# Patient Record
Sex: Female | Born: 1965 | Race: White | Hispanic: No | Smoking: Current every day smoker
Health system: Southern US, Community
[De-identification: ages and names within clinical notes are randomized; demographics above are authoritative.]

## PROBLEM LIST (undated history)

## (undated) DIAGNOSIS — R7611 Nonspecific reaction to tuberculin skin test without active tuberculosis: Secondary | ICD-10-CM

## (undated) DIAGNOSIS — M545 Low back pain, unspecified: Secondary | ICD-10-CM

## (undated) DIAGNOSIS — I10 Essential (primary) hypertension: Secondary | ICD-10-CM

## (undated) DIAGNOSIS — R51 Headache: Secondary | ICD-10-CM

## (undated) DIAGNOSIS — J449 Chronic obstructive pulmonary disease, unspecified: Secondary | ICD-10-CM

## (undated) HISTORY — DX: Low back pain, unspecified: M54.50

## (undated) HISTORY — DX: Low back pain: M54.5

## (undated) HISTORY — DX: Chronic obstructive pulmonary disease, unspecified: J44.9

---

## 1996-07-03 DIAGNOSIS — R7611 Nonspecific reaction to tuberculin skin test without active tuberculosis: Secondary | ICD-10-CM

## 1996-07-03 HISTORY — DX: Nonspecific reaction to tuberculin skin test without active tuberculosis: R76.11

## 1997-07-03 HISTORY — PX: PARTIAL HYSTERECTOMY: SHX80

## 2002-09-13 LAB — HM COLONOSCOPY: HM Colonoscopy: NORMAL

## 2004-07-05 ENCOUNTER — Emergency Department: Payer: Self-pay | Admitting: Emergency Medicine

## 2005-01-22 ENCOUNTER — Other Ambulatory Visit: Payer: Self-pay

## 2005-01-22 ENCOUNTER — Emergency Department: Payer: Self-pay | Admitting: Emergency Medicine

## 2005-02-10 ENCOUNTER — Emergency Department: Payer: Self-pay | Admitting: Emergency Medicine

## 2005-04-27 ENCOUNTER — Emergency Department: Payer: Self-pay | Admitting: Internal Medicine

## 2005-06-15 ENCOUNTER — Emergency Department: Payer: Self-pay | Admitting: Emergency Medicine

## 2006-09-13 LAB — HM PAP SMEAR: HM Pap smear: NORMAL

## 2007-04-24 ENCOUNTER — Emergency Department: Payer: Self-pay | Admitting: Emergency Medicine

## 2007-11-19 ENCOUNTER — Emergency Department: Payer: Self-pay | Admitting: Emergency Medicine

## 2008-03-13 ENCOUNTER — Emergency Department: Payer: Self-pay | Admitting: Internal Medicine

## 2008-05-05 ENCOUNTER — Emergency Department: Payer: Self-pay | Admitting: Unknown Physician Specialty

## 2008-06-25 ENCOUNTER — Emergency Department: Payer: Self-pay | Admitting: Internal Medicine

## 2008-08-12 ENCOUNTER — Emergency Department: Payer: Self-pay | Admitting: Internal Medicine

## 2008-09-28 ENCOUNTER — Emergency Department: Payer: Self-pay | Admitting: Emergency Medicine

## 2008-10-04 ENCOUNTER — Emergency Department: Payer: Self-pay | Admitting: Emergency Medicine

## 2009-03-21 ENCOUNTER — Emergency Department: Payer: Self-pay | Admitting: Unknown Physician Specialty

## 2009-05-28 ENCOUNTER — Emergency Department: Payer: Self-pay | Admitting: Emergency Medicine

## 2009-06-15 ENCOUNTER — Emergency Department: Payer: Self-pay | Admitting: Emergency Medicine

## 2009-08-02 ENCOUNTER — Emergency Department: Payer: Self-pay | Admitting: Emergency Medicine

## 2009-08-23 ENCOUNTER — Emergency Department: Payer: Self-pay | Admitting: Emergency Medicine

## 2009-10-18 ENCOUNTER — Emergency Department: Payer: Self-pay | Admitting: Emergency Medicine

## 2009-11-13 ENCOUNTER — Emergency Department: Payer: Self-pay | Admitting: Emergency Medicine

## 2010-03-13 ENCOUNTER — Emergency Department: Payer: Self-pay | Admitting: Emergency Medicine

## 2010-04-28 ENCOUNTER — Emergency Department: Payer: Self-pay | Admitting: Emergency Medicine

## 2010-05-04 ENCOUNTER — Emergency Department: Payer: Self-pay | Admitting: Emergency Medicine

## 2010-05-09 ENCOUNTER — Emergency Department: Payer: Self-pay | Admitting: Emergency Medicine

## 2010-08-01 ENCOUNTER — Emergency Department: Payer: Self-pay | Admitting: Emergency Medicine

## 2010-09-16 ENCOUNTER — Emergency Department: Payer: Self-pay | Admitting: Emergency Medicine

## 2011-05-11 ENCOUNTER — Emergency Department: Payer: Self-pay | Admitting: Emergency Medicine

## 2011-08-10 ENCOUNTER — Telehealth: Payer: Self-pay | Admitting: Internal Medicine

## 2011-08-10 NOTE — Telephone Encounter (Signed)
Pt called  To make new patient appointment  This was made for 09/25/11 She went urgent care [facility] and they wanted her to follow up for bp  They gave her enough meds to last till 3/5  Pt wanted to know if she could be see sooner

## 2011-08-15 NOTE — Telephone Encounter (Signed)
Yes, please move into earlier slot, 

## 2011-08-16 NOTE — Telephone Encounter (Signed)
Pt had appointment 2/26  Pt aware

## 2011-08-16 NOTE — Telephone Encounter (Signed)
Left message for pt to call office to r/s

## 2011-08-29 ENCOUNTER — Encounter: Payer: Self-pay | Admitting: Internal Medicine

## 2011-08-29 ENCOUNTER — Ambulatory Visit (INDEPENDENT_AMBULATORY_CARE_PROVIDER_SITE_OTHER): Payer: BC Managed Care – PPO | Admitting: Internal Medicine

## 2011-08-29 ENCOUNTER — Ambulatory Visit (INDEPENDENT_AMBULATORY_CARE_PROVIDER_SITE_OTHER)
Admission: RE | Admit: 2011-08-29 | Discharge: 2011-08-29 | Disposition: A | Discharge status: Home / Self Care | Payer: BC Managed Care – PPO | Source: Ambulatory Visit | Attending: Internal Medicine | Admitting: Internal Medicine

## 2011-08-29 VITALS — BP 148/78 | HR 72 | Temp 98.3°F | Ht 64.0 in | Wt 163.0 lb

## 2011-08-29 DIAGNOSIS — M25569 Pain in unspecified knee: Secondary | ICD-10-CM

## 2011-08-29 DIAGNOSIS — I10 Essential (primary) hypertension: Secondary | ICD-10-CM

## 2011-08-29 DIAGNOSIS — M25561 Pain in right knee: Secondary | ICD-10-CM

## 2011-08-29 DIAGNOSIS — J449 Chronic obstructive pulmonary disease, unspecified: Secondary | ICD-10-CM

## 2011-08-29 DIAGNOSIS — M79609 Pain in unspecified limb: Secondary | ICD-10-CM

## 2011-08-29 DIAGNOSIS — M79672 Pain in left foot: Secondary | ICD-10-CM

## 2011-08-29 DIAGNOSIS — Z1239 Encounter for other screening for malignant neoplasm of breast: Secondary | ICD-10-CM | POA: Insufficient documentation

## 2011-08-29 MED ORDER — MELOXICAM 15 MG PO TABS: 15.0000 mg | ORAL_TABLET | Freq: Every day | ORAL | Status: DC

## 2011-08-29 MED ORDER — ALBUTEROL SULFATE HFA 108 (90 BASE) MCG/ACT IN AERS: 2.0000 | INHALATION_SPRAY | Freq: Four times a day (QID) | RESPIRATORY_TRACT | Status: DC | PRN

## 2011-08-29 NOTE — Assessment & Plan Note (Signed)
Patient was started on lisinopril at urgent care. She had some abdominal pain at that time and stopped the medication. We'll have her resume lisinopril today. She will call if any noted side effects. We will obtain records on her recent renal function as checked at urgent care. Followup here in one month.

## 2011-08-29 NOTE — Assessment & Plan Note (Signed)
Exam is remarkable for some posterior knee tenderness. Given her job, question if she may have a meniscal tear. Will get plain x-ray for initial evaluation and then proceed with MRI if x-ray is normal. Will start meloxicam as needed for pain. Follow up 1 month.

## 2011-08-29 NOTE — Assessment & Plan Note (Signed)
Patient reports history of plantar fasciitis. She now has a nodular area in her left medial foot which is concerning for neuroma. Will set her up with podiatry for evaluation and possible steroid injection. She ultimately may need this area resected given that she spends at least 12 hours per day on her feet.

## 2011-08-29 NOTE — Assessment & Plan Note (Signed)
Mammogram ordered today. She will be scheduled for physical including breast exam in one month.

## 2011-08-29 NOTE — Progress Notes (Signed)
Subjective:    Patient ID: Allison Brennan, female    DOB: 01/07/1966, 46 y.o.   MRN: [id number]  HPI 46 year old female with history of hypertension presents to establish care. She reports that she was diagnosed with hypertension at urgent care and was started on lisinopril. She took the medication for a couple of weeks and then stopped approximately 2 weeks ago because she was having abdominal pain. At the same time, she was started on an anti-inflammatory medicine and questions whether this may have been the cause of her abdominal pain. She is not currently taking anything for her blood pressure. She denies any headache, palpitations, chest pain.  She also has a history of COPD and is a current smoker. She reports she has tried to quit smoking in the past including using Wellbutrin and Chantix without success. She would like to try to quit again. She denies any current shortness of breath, cough, or other symptoms. In the past, she has used albuterol as needed for episodes of cough or dyspnea. She is currently out of this medication.  She is also concerned today about a several month history of right knee pain. She denies any specific injury to her right knee but notes that she spends more than 12 hours per day standing. She reports occasional popping sound in her right knee. She also notes some instability of her knee. She notes pain in her posterior right knee. She denies any swelling. She denies any weakness in her leg. She has taken lodine for this with no improvement.  She is also concerned today about several month history of left foot pain. She notes that she was diagnosed with plantar fasciitis in the past and has now developed a nodular area in the middle of her left foot. This area is painful and has inhibited her from wearing her steel toed boots at work. She has tried orthotics with no improvement.  Outpatient Encounter Prescriptions as of 08/29/2011  Medication Sig Dispense Refill  .  albuterol (PROVENTIL HFA;VENTOLIN HFA) 108 (90 BASE) MCG/ACT inhaler Inhale 2 puffs into the lungs every 6 (six) hours as needed for wheezing.  1 Inhaler  0  . lisinopril (PRINIVIL,ZESTRIL) 10 MG tablet Take 10 mg by mouth daily.      . meloxicam (MOBIC) 15 MG tablet Take 1 tablet (15 mg total) by mouth daily.  30 tablet  0    Review of Systems  Constitutional: Negative for fever, chills, appetite change, fatigue and unexpected weight change.  HENT: Negative for ear pain, congestion, sore throat, trouble swallowing, neck pain, voice change and sinus pressure.   Eyes: Negative for visual disturbance.  Respiratory: Negative for cough, shortness of breath, wheezing and stridor.   Cardiovascular: Negative for chest pain, palpitations and leg swelling.  Gastrointestinal: Negative for nausea, vomiting, abdominal pain, diarrhea, constipation, blood in stool, abdominal distention and anal bleeding.  Genitourinary: Negative for dysuria and flank pain.  Musculoskeletal: Positive for myalgias and arthralgias. Negative for gait problem.  Skin: Negative for color change and rash.  Neurological: Negative for dizziness and headaches.  Hematological: Negative for adenopathy. Does not bruise/bleed easily.  Psychiatric/Behavioral: Negative for suicidal ideas, sleep disturbance and dysphoric mood. The patient is not nervous/anxious.    BP 148/78  Pulse 72  Temp(Src) 98.3 F (36.8 C) (Oral)  Ht 5\' 4"  (1.626 m)  Wt 163 lb (73.936 kg)  BMI 27.98 kg/m2  SpO2 99%     Objective:   Physical Exam  Constitutional: She is oriented  to person, place, and time. She appears well-developed and well-nourished. No distress.  HENT:  Head: Normocephalic and atraumatic.  Right Ear: External ear normal.  Left Ear: External ear normal.  Nose: Nose normal.  Mouth/Throat: Oropharynx is clear and moist. No oropharyngeal exudate.  Eyes: Conjunctivae are normal. Pupils are equal, round, and reactive to light. Right eye  exhibits no discharge. Left eye exhibits no discharge. No scleral icterus.  Neck: Normal range of motion. Neck supple. No tracheal deviation present. No thyromegaly present.  Cardiovascular: Normal rate, regular rhythm, normal heart sounds and intact distal pulses.  Exam reveals no gallop and no friction rub.   No murmur heard. Pulmonary/Chest: Effort normal. No respiratory distress. She has decreased breath sounds (prolonged expiration). She has no wheezes. She has no rales. She exhibits no tenderness.  Musculoskeletal: She exhibits no edema and no tenderness.       Right knee: She exhibits normal range of motion, no swelling, no effusion, no deformity, no LCL laxity, normal patellar mobility and no bony tenderness. tenderness found.       Left foot: She exhibits tenderness. She exhibits normal range of motion.       Feet:  Lymphadenopathy:    She has no cervical adenopathy.  Neurological: She is alert and oriented to person, place, and time. No cranial nerve deficit. She exhibits normal muscle tone. Coordination normal.  Skin: Skin is warm and dry. No rash noted. She is not diaphoretic. No erythema. No pallor.  Psychiatric: She has a normal mood and affect. Her behavior is normal. Judgment and thought content normal.          Assessment & Plan:

## 2011-08-29 NOTE — Assessment & Plan Note (Signed)
Patient reports diagnosis of COPD in the past. She is a current smoker. Strongly encourage smoking cessation. Will resume albuterol as needed for episodes of cough or dyspnea. Will obtain records on previous evaluation and management.

## 2011-08-30 ENCOUNTER — Other Ambulatory Visit: Payer: Self-pay | Admitting: *Deleted

## 2011-08-30 MED ORDER — ALBUTEROL SULFATE HFA 108 (90 BASE) MCG/ACT IN AERS: 2.0000 | INHALATION_SPRAY | Freq: Four times a day (QID) | RESPIRATORY_TRACT | Status: DC | PRN

## 2011-09-25 ENCOUNTER — Ambulatory Visit: Payer: Self-pay | Admitting: Internal Medicine

## 2011-09-25 ENCOUNTER — Telehealth: Payer: Self-pay | Admitting: Internal Medicine

## 2011-09-25 MED ORDER — LISINOPRIL 10 MG PO TABS: 10.0000 mg | ORAL_TABLET | Freq: Every day | ORAL | Status: DC

## 2011-09-25 NOTE — Telephone Encounter (Signed)
Caller: Miko/Patient; PCP: Ronna Polio; CB#: [phone]; ; ; Call regarding Dizziness upon standing up out of bed this am;  Pt calling regarding dizziness; onset this am upon standing-lasting 45 min. Pt takes Lisinipril with HCTZ but ran out on 3/22. Asymptomatic now. Afebrile. Emergent sxs of Dizziness r/o. Disp: See in 24 hrs. RX has been called in to [facility] today-relayed to pt. Appt made for 10/03/2011 for BP f/u at 0815. Home care advice given with call back parameters for 24 hrs if sxs recurred.

## 2011-09-25 NOTE — Telephone Encounter (Signed)
Rx sent to pharmacy   

## 2011-09-25 NOTE — Telephone Encounter (Signed)
[facility] Triage Call Report Triage Record Num: [id number] Operator: Baldomero Lamy Patient Name: Allison Brennan Call Date & Time: 09/25/2011 12:04:15PM Patient Phone: [phone] PCP: Ronna Polio Patient Gender: Female PCP Fax : [phone] Patient DOB: July 06, 1965 Practice Name: [facility] Reason for Call: Caller: [name]/Patient; PCP: Ronna Polio; CB#: [phone]; ; ; Call regarding Dizziness; Pt calling regarding dizziness; onset this am upon standing-lasting 45 min. Pt takes Lisinipril with HCTZ but ran out on 3/22. Asymptomatic now. Afebrile. Emergent sxs of Dizziness r/o. Disp: See in 24 hrs. RX has been called in to [facility] today-relayed to pt. Appt made for 10/03/2011 for BP f/u at 0815. Home care advice given with call back parameters for 24 hrs if sxs recurred. Protocol(s) Used: Dizziness or Vertigo Recommended Outcome per Protocol: See Provider within 24 hours Reason for Outcome: Symptoms began after starting or changing dose of prescription, nonprescription, alternative medication, or illicit drug Care Advice: Call EMS 911 if patient develops confusion, decreased level of consciousness, chest pain, shortness of breath, or focal neurologic abnormalities such as facial droop or weakness of one extremity. ~ ~ DO NOT drive until condition evaluated. ~ Protect from falling or other injury. Avoid caffeine (coffee, tea, cola drinks, or chocolate), alcohol, and nicotine (use of tobacco), as use of these substances may worsen symptoms. ~ ~ Call provider immediately if have difficulty walking, vision problems, or weakness. ~ SYMPTOM / CONDITION MANAGEMENT ~ SAFETY / PROTECTION ~ CAUTIONS ~ List, or take, all current prescription(s), nonprescription or alternative medication(s) to provider for evaluation. ~ Lie still in a dimly lit room and avoid any sudden change in position. Medication Advice: - Discontinue all nonprescription and alternative  medications, especially stimulants, until evaluated by provider. - Take prescribed medications as directed, following label instructions for the medication. - Do not change medications or dosing regimen until provider is consulted. - Know possible side effects of medication and what to do if they occur. - Tell provider all prescription, nonprescription or alternative medications that you take ~ A temporary drop in blood pressure sometimes occurs with a quick change to an upright position (postural hypotension) and may cause light-headedness or dizziness. Change position slowly. Making a habit of rising slowly and sitting for a few minutes before standing to walk usually relieves the feeling of faintness. ~ When feeling faint, find a place to lie down if possible, and elevate legs 8 to 12 inches above the heart. If unable to lie down, sit in a chair and lower head between the knees for 3 to 5 minutes. If standing and not able to sit, cross legs and squeeze the knees together to move blood to the heart. ~ 09/25/2011 12:32:49PM Page 1 of 1 [id number]

## 2011-09-25 NOTE — Telephone Encounter (Signed)
Lisinopril 10 mg,HCTZ 12.5 needing a refill.

## 2011-10-03 ENCOUNTER — Encounter: Payer: Self-pay | Admitting: Internal Medicine

## 2011-10-03 ENCOUNTER — Ambulatory Visit (INDEPENDENT_AMBULATORY_CARE_PROVIDER_SITE_OTHER): Payer: BC Managed Care – PPO | Admitting: Internal Medicine

## 2011-10-03 VITALS — BP 148/80 | HR 70 | Temp 97.4°F | Ht 64.0 in | Wt 163.0 lb

## 2011-10-03 DIAGNOSIS — J441 Chronic obstructive pulmonary disease with (acute) exacerbation: Secondary | ICD-10-CM | POA: Insufficient documentation

## 2011-10-03 DIAGNOSIS — M25569 Pain in unspecified knee: Secondary | ICD-10-CM

## 2011-10-03 DIAGNOSIS — I10 Essential (primary) hypertension: Secondary | ICD-10-CM

## 2011-10-03 DIAGNOSIS — M25561 Pain in right knee: Secondary | ICD-10-CM

## 2011-10-03 MED ORDER — PREDNISONE (PAK) 10 MG PO TABS: ORAL_TABLET | ORAL | Status: AC

## 2011-10-03 MED ORDER — BUDESONIDE-FORMOTEROL FUMARATE 160-4.5 MCG/ACT IN AERO: 2.0000 | INHALATION_SPRAY | Freq: Two times a day (BID) | RESPIRATORY_TRACT | Status: DC

## 2011-10-03 MED ORDER — HYDROCODONE-ACETAMINOPHEN 5-500 MG PO TABS: 1.0000 | ORAL_TABLET | Freq: Three times a day (TID) | ORAL | Status: AC | PRN

## 2011-10-03 MED ORDER — LOSARTAN POTASSIUM 50 MG PO TABS: 50.0000 mg | ORAL_TABLET | Freq: Every day | ORAL | Status: DC

## 2011-10-03 MED ORDER — AZITHROMYCIN 250 MG PO TABS: ORAL_TABLET | ORAL | Status: AC

## 2011-10-03 NOTE — Assessment & Plan Note (Signed)
Persistent pain not responsive to NSAIDs and Tramadol.  Xray was normal. Having popping and instability with walking. Suspect meniscal tear. Will get MRI.

## 2011-10-03 NOTE — Progress Notes (Signed)
Subjective:    Patient ID: Allison Brennan, female    DOB: 1966/04/08, 46 y.o.   MRN: [id number]  HPI 46 year old female with history of hypertension, COPD, and recent episode of right knee pain presents for followup. In regards to her hypertension, she reports compliance with lisinopril. She has noted a dry cough since using this medication. She denies headache, chest pain, palpitations. She has not been checking her blood pressure.  In regards to her COPD, she notes over the last week increased wheezing and shortness of breath. She notes dry cough but no cough productive of sputum. She has been using albuterol as needed with minimal improvement.  In regards to her right knee pain, she had a plain x-ray of her right knee which was normal. The aching pain in her right knee has persisted since December 2012. She notes frequent popping sounds in her right knee and some instability in her knee. She also notes swelling after prolonged standing. She has been using meloxicam and has tried tramadol with no improvement.  Outpatient Encounter Prescriptions as of 10/03/2011  Medication Sig Dispense Refill  . albuterol (PROAIR HFA) 108 (90 BASE) MCG/ACT inhaler Inhale 2 puffs into the lungs every 6 (six) hours as needed for wheezing.  1 Inhaler  3  . meloxicam (MOBIC) 15 MG tablet Take 1 tablet (15 mg total) by mouth daily.  30 tablet  0  . DISCONTD: lisinopril (PRINIVIL,ZESTRIL) 10 MG tablet Take 1 tablet (10 mg total) by mouth daily.  30 tablet  3  . azithromycin (ZITHROMAX Z-PAK) 250 MG tablet Take 2 pills day 1, then 1 pill daily days 2-5  6 each  0  . budesonide-formoterol (SYMBICORT) 160-4.5 MCG/ACT inhaler Inhale 2 puffs into the lungs 2 (two) times daily.  1 Inhaler  3  . HYDROcodone-acetaminophen (VICODIN) 5-500 MG per tablet Take 1 tablet by mouth every 8 (eight) hours as needed for pain.  60 tablet  0  . losartan (COZAAR) 50 MG tablet Take 1 tablet (50 mg total) by mouth daily.  30 tablet  3  .  predniSONE (STERAPRED UNI-PAK) 10 MG tablet Take 60mg  day 1 then taper by 10mg  daily  21 tablet  0    Review of Systems  Constitutional: Negative for fever, chills, appetite change, fatigue and unexpected weight change.  HENT: Negative for ear pain, congestion, sore throat, trouble swallowing, neck pain, voice change and sinus pressure.   Eyes: Negative for visual disturbance.  Respiratory: Positive for cough, shortness of breath and wheezing. Negative for stridor.   Cardiovascular: Negative for chest pain, palpitations and leg swelling.  Gastrointestinal: Negative for nausea, vomiting, abdominal pain, diarrhea, constipation, blood in stool, abdominal distention and anal bleeding.  Genitourinary: Negative for dysuria and flank pain.  Musculoskeletal: Positive for myalgias, joint swelling and arthralgias. Negative for gait problem.  Skin: Negative for color change and rash.  Neurological: Negative for dizziness and headaches.  Hematological: Negative for adenopathy. Does not bruise/bleed easily.  Psychiatric/Behavioral: Negative for suicidal ideas, sleep disturbance and dysphoric mood. The patient is not nervous/anxious.    BP 148/80  Pulse 70  Temp(Src) 97.4 F (36.3 C) (Oral)  Ht 5\' 4"  (1.626 m)  Wt 163 lb (73.936 kg)  BMI 27.98 kg/m2  SpO2 99%     Objective:   Physical Exam  Constitutional: She is oriented to person, place, and time. She appears well-developed and well-nourished. No distress.  HENT:  Head: Normocephalic and atraumatic.  Right Ear: External ear normal.  Left  Ear: External ear normal.  Nose: Nose normal.  Mouth/Throat: Oropharynx is clear and moist. No oropharyngeal exudate.  Eyes: Conjunctivae are normal. Pupils are equal, round, and reactive to light. Right eye exhibits no discharge. Left eye exhibits no discharge. No scleral icterus.  Neck: Normal range of motion. Neck supple. No tracheal deviation present. No thyromegaly present.  Cardiovascular: Normal  rate, regular rhythm, normal heart sounds and intact distal pulses.  Exam reveals no gallop and no friction rub.   No murmur heard. Pulmonary/Chest: Effort normal. No accessory muscle usage. Not tachypneic. No respiratory distress. She has decreased breath sounds (prolonged expiration). She has wheezes. She has no rhonchi. She has no rales. She exhibits no tenderness.  Musculoskeletal: Normal range of motion. She exhibits no edema and no tenderness.       Right knee: She exhibits swelling. She exhibits normal range of motion.  Lymphadenopathy:    She has no cervical adenopathy.  Neurological: She is alert and oriented to person, place, and time. No cranial nerve deficit. She exhibits normal muscle tone. Coordination normal.  Skin: Skin is warm and dry. No rash noted. She is not diaphoretic. No erythema. No pallor.  Psychiatric: She has a normal mood and affect. Her behavior is normal. Judgment and thought content normal.          Assessment & Plan:

## 2011-10-03 NOTE — Assessment & Plan Note (Signed)
Recent increase in wheezing. Dry cough. Exam consistent with COPD exacerbation. Will treat with azithromycin and prednisone taper. Will start Symbicort. Will continue albuterol prn. Strongly encouraged her to continue with efforts at smoking cessation. Follow up 1 month or sooner as needed.

## 2011-10-03 NOTE — Assessment & Plan Note (Signed)
BP slightly elevated. Having some cough on Lisinopril. Difficult to tell if this is related to COPD or allergy from ACEi. Will change to losartan. Follow up 1 month.

## 2011-11-03 ENCOUNTER — Ambulatory Visit: Payer: BC Managed Care – PPO | Admitting: Internal Medicine

## 2011-11-13 ENCOUNTER — Ambulatory Visit (INDEPENDENT_AMBULATORY_CARE_PROVIDER_SITE_OTHER): Payer: BC Managed Care – PPO | Admitting: Internal Medicine

## 2011-11-13 ENCOUNTER — Encounter: Payer: Self-pay | Admitting: Internal Medicine

## 2011-11-13 VITALS — BP 145/85 | HR 68 | Temp 97.9°F | Resp 16 | Wt 160.2 lb

## 2011-11-13 DIAGNOSIS — M25561 Pain in right knee: Secondary | ICD-10-CM

## 2011-11-13 DIAGNOSIS — M25569 Pain in unspecified knee: Secondary | ICD-10-CM

## 2011-11-13 DIAGNOSIS — M545 Low back pain, unspecified: Secondary | ICD-10-CM | POA: Insufficient documentation

## 2011-11-13 DIAGNOSIS — I10 Essential (primary) hypertension: Secondary | ICD-10-CM

## 2011-11-13 DIAGNOSIS — J449 Chronic obstructive pulmonary disease, unspecified: Secondary | ICD-10-CM

## 2011-11-13 LAB — BASIC METABOLIC PANEL
BUN: 10 mg/dL (ref 6–23)
CO2: 25 mEq/L (ref 19–32)
Calcium: 9 mg/dL (ref 8.4–10.5)
Chloride: 100 mEq/L (ref 96–112)
Creatinine, Ser: 0.7 mg/dL (ref 0.4–1.2)
GFR: 101.76 mL/min (ref >60.00)
Glucose, Bld: 79 mg/dL (ref 70–99)
Potassium: 3.9 mEq/L (ref 3.5–5.1)
Sodium: 142 mEq/L (ref 135–145)

## 2011-11-13 MED ORDER — LOSARTAN POTASSIUM 100 MG PO TABS: 100.0000 mg | ORAL_TABLET | Freq: Every day | ORAL | Status: DC

## 2011-11-13 NOTE — Assessment & Plan Note (Signed)
BP continues to be elevated today. Will increase Losartan to 100mg  daily. Will check BMP today. Follow up 1 month.

## 2011-11-13 NOTE — Progress Notes (Signed)
Subjective:    Patient ID: Allison Brennan, female    DOB: 1965-12-23, 46 y.o.   MRN: [id number]  HPI 46 year old female with history of hypertension, tobacco abuse, COPD, chronic low back pain, and chronic right knee pain presents for followup. In regards to her right knee pain, she reports symptoms of pain and instability are persistent. She has not yet been scheduled for MRI which was ordered at her last visit. She is not currently taking any medication for pain except for meloxicam, which gives minimal improvement  In regards to her chronic low back pain, we reviewed MRI of her lumbar spine from 2008 which showed bulging disc at multiple levels. She has never had followup for this. She reports diffuse aching pain in her lower back. She takes meloxicam with no improvement in symptoms. She has not had weakness in her legs.  In regards to her history of tobacco abuse, she reports that she is weaning down on the number of cigarettes she smokes a daily basis. She is also contemplating using the electronic cigarette. She continues to use albuterol and Advair or Symbicort to help control symptoms of shortness of breath and cough.  In regards to her history of hypertension, she reports that her cough resolved with changing lisinopril to losartan. She reports full compliance with this medication. She has not been checking her blood pressure regularly.   Outpatient Encounter Prescriptions as of 11/13/2011  Medication Sig Dispense Refill  . albuterol (PROAIR HFA) 108 (90 BASE) MCG/ACT inhaler Inhale 2 puffs into the lungs every 6 (six) hours as needed for wheezing.  1 Inhaler  3  . losartan (COZAAR) 100 MG tablet Take 1 tablet (100 mg total) by mouth daily.  30 tablet  3  . meloxicam (MOBIC) 15 MG tablet Take 1 tablet (15 mg total) by mouth daily.  30 tablet  0  . DISCONTD: losartan (COZAAR) 50 MG tablet Take 1 tablet (50 mg total) by mouth daily.  30 tablet  3    Review of Systems  Constitutional:  Negative for fever, chills, appetite change, fatigue and unexpected weight change.  HENT: Negative for ear pain, congestion, sore throat, trouble swallowing, neck pain, voice change and sinus pressure.   Eyes: Negative for visual disturbance.  Respiratory: Positive for cough and shortness of breath. Negative for wheezing and stridor.   Cardiovascular: Negative for chest pain, palpitations and leg swelling.  Gastrointestinal: Negative for nausea, vomiting, abdominal pain, diarrhea, constipation, blood in stool, abdominal distention and anal bleeding.  Genitourinary: Negative for dysuria and flank pain.  Musculoskeletal: Positive for myalgias, back pain and arthralgias. Negative for gait problem.  Skin: Negative for color change and rash.  Neurological: Negative for dizziness and headaches.  Hematological: Negative for adenopathy. Does not bruise/bleed easily.  Psychiatric/Behavioral: Negative for suicidal ideas, sleep disturbance and dysphoric mood. The patient is not nervous/anxious.    BP 145/85  Pulse 68  Temp(Src) 97.9 F (36.6 C) (Oral)  Resp 16  Wt 160 lb 4 oz (72.689 kg)  SpO2 99%     Objective:   Physical Exam  Constitutional: She is oriented to person, place, and time. She appears well-developed and well-nourished. No distress.  HENT:  Head: Normocephalic and atraumatic.  Right Ear: External ear normal.  Left Ear: External ear normal.  Nose: Nose normal.  Mouth/Throat: Oropharynx is clear and moist. No oropharyngeal exudate.  Eyes: Conjunctivae are normal. Pupils are equal, round, and reactive to light. Right eye exhibits no discharge. Left eye exhibits  no discharge. No scleral icterus.  Neck: Normal range of motion. Neck supple. No tracheal deviation present. No thyromegaly present.  Cardiovascular: Normal rate, regular rhythm, normal heart sounds and intact distal pulses.  Exam reveals no gallop and no friction rub.   No murmur heard. Pulmonary/Chest: Effort normal. No  accessory muscle usage. Not tachypneic. No respiratory distress. She has decreased breath sounds (prolonged expiration). She has no wheezes. She has no rales. She exhibits no tenderness.  Musculoskeletal: Normal range of motion. She exhibits no edema and no tenderness.       Right knee: She exhibits normal range of motion and no swelling.       Lumbar back: She exhibits pain. She exhibits normal range of motion and no tenderness.  Lymphadenopathy:    She has no cervical adenopathy.  Neurological: She is alert and oriented to person, place, and time. No cranial nerve deficit. She exhibits normal muscle tone. Coordination normal.  Skin: Skin is warm and dry. No rash noted. She is not diaphoretic. No erythema. No pallor.  Psychiatric: She has a normal mood and affect. Her behavior is normal. Judgment and thought content normal.          Assessment & Plan:

## 2011-11-13 NOTE — Assessment & Plan Note (Signed)
Pain is persistent. Instability with walking suggest meniscal tear. MRI was never scheduled. Will set that up today.

## 2011-11-13 NOTE — Assessment & Plan Note (Signed)
Chronic. 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 No improvement with meloxicam. Will set up with orthopedics for further evaluation.

## 2011-11-13 NOTE — Assessment & Plan Note (Signed)
Symptomatically improved since last visit. Encouraged efforts at smoking cessation. Will change to Advair (as we have samples of those today).  Continue albuterol prn. Follow up 1 month.

## 2011-11-15 ENCOUNTER — Other Ambulatory Visit: Payer: Self-pay | Admitting: Internal Medicine

## 2011-11-15 ENCOUNTER — Telehealth: Payer: Self-pay | Admitting: *Deleted

## 2011-11-15 MED ORDER — HYDROCODONE-ACETAMINOPHEN 5-500 MG PO TABS: 1.0000 | ORAL_TABLET | Freq: Three times a day (TID) | ORAL | Status: DC | PRN

## 2011-11-15 NOTE — Telephone Encounter (Signed)
Rx done; patient informed/SLS 

## 2011-11-15 NOTE — Telephone Encounter (Signed)
Patient requesting Rx refill for Hydrocodone-APAP 5-500 mg [last refill 04.02.13 #[id number]]/SLS Please advise.

## 2011-11-15 NOTE — Telephone Encounter (Signed)
Fine to refill. Same instructions. 

## 2011-11-22 ENCOUNTER — Ambulatory Visit: Payer: Self-pay | Admitting: Internal Medicine

## 2011-11-22 ENCOUNTER — Telehealth: Payer: Self-pay | Admitting: Internal Medicine

## 2011-11-22 DIAGNOSIS — S83249A Other tear of medial meniscus, current injury, unspecified knee, initial encounter: Secondary | ICD-10-CM

## 2011-11-22 NOTE — Telephone Encounter (Signed)
MRI shows tear of medial meniscus.  We need to set up orthopedics referral.

## 2011-11-23 NOTE — Telephone Encounter (Signed)
Per VO JAW, referral placed in EMR; patient informed/SLS

## 2011-11-30 ENCOUNTER — Encounter: Payer: Self-pay | Admitting: Internal Medicine

## 2011-12-15 ENCOUNTER — Other Ambulatory Visit: Payer: Self-pay | Admitting: Internal Medicine

## 2011-12-15 ENCOUNTER — Ambulatory Visit (INDEPENDENT_AMBULATORY_CARE_PROVIDER_SITE_OTHER): Payer: BC Managed Care – PPO | Admitting: Internal Medicine

## 2011-12-15 ENCOUNTER — Encounter: Payer: Self-pay | Admitting: Internal Medicine

## 2011-12-15 VITALS — BP 160/90 | HR 62 | Temp 98.3°F | Ht 64.0 in | Wt 157.8 lb

## 2011-12-15 DIAGNOSIS — Z23 Encounter for immunization: Secondary | ICD-10-CM

## 2011-12-15 DIAGNOSIS — I10 Essential (primary) hypertension: Secondary | ICD-10-CM

## 2011-12-15 DIAGNOSIS — IMO0002 Reserved for concepts with insufficient information to code with codable children: Secondary | ICD-10-CM

## 2011-12-15 DIAGNOSIS — S83206A Unspecified tear of unspecified meniscus, current injury, right knee, initial encounter: Secondary | ICD-10-CM

## 2011-12-15 LAB — LIPID PANEL
Cholesterol: 149 mg/dL (ref 0–200)
HDL: 36.3 mg/dL — ABNORMAL LOW (ref >39.00)
LDL Cholesterol: 75 mg/dL (ref 0–99)
Total CHOL/HDL Ratio: 4
Triglycerides: 189 mg/dL — ABNORMAL HIGH (ref 0.0–149.0)
VLDL: 37.8 mg/dL (ref 0.0–40.0)

## 2011-12-15 LAB — COMPREHENSIVE METABOLIC PANEL
ALT: 8 U/L (ref 0–35)
AST: 12 U/L (ref 0–37)
Albumin: 3.9 g/dL (ref 3.5–5.2)
Alkaline Phosphatase: 63 U/L (ref 39–117)
BUN: 9 mg/dL (ref 6–23)
CO2: 28 mEq/L (ref 19–32)
Calcium: 9 mg/dL (ref 8.4–10.5)
Chloride: 104 mEq/L (ref 96–112)
Creatinine, Ser: 0.6 mg/dL (ref 0.4–1.2)
GFR: 118.08 mL/min (ref >60.00)
Glucose, Bld: 80 mg/dL (ref 70–99)
Potassium: 3 mEq/L — ABNORMAL LOW (ref 3.5–5.1)
Sodium: 142 mEq/L (ref 135–145)
Total Bilirubin: 0.3 mg/dL (ref 0.3–1.2)
Total Protein: 7.2 g/dL (ref 6.0–8.3)

## 2011-12-15 MED ORDER — LOSARTAN POTASSIUM-HCTZ 100-12.5 MG PO TABS: 1.0000 | ORAL_TABLET | Freq: Every day | ORAL | Status: DC

## 2011-12-15 MED ORDER — HYDROCODONE-ACETAMINOPHEN 5-500 MG PO TABS: 1.0000 | ORAL_TABLET | Freq: Three times a day (TID) | ORAL | Status: DC | PRN

## 2011-12-15 NOTE — Assessment & Plan Note (Signed)
Blood pressure is elevated today. Will change to losartan to losartan hydrochlorothiazide. Will followup in one month to recheck blood pressure. Suspect elevated pressure is partially secondary to pain in her right knee.

## 2011-12-15 NOTE — Assessment & Plan Note (Signed)
Right knee pain secondary to meniscal tear. Orthopedic evaluation is pending. Will continue Vicodin as needed for severe pain.

## 2011-12-15 NOTE — Progress Notes (Signed)
Rx for Hydrocodone called to [facility]. 

## 2011-12-15 NOTE — Progress Notes (Signed)
Subjective:    Patient ID: Allison Brennan, female    DOB: 27-Sep-1965, 46 y.o.   MRN: [id number]  HPI 46 year old female with history of right knee pain secondary to meniscal tear and hypertension presents for followup. She reports that things are relatively unchanged. She still reports significant pain in her right knee and some difficulty with ambulation. She also has pain in her lower back. She continues to use Vicodin up to twice daily to help with severe pain. She has scheduled evaluation with orthopedic surgeon and this is pending for next month.  In regards to her history of hypertension, she reports full compliance with her medication. Her dose of losartan was recently increased to 100 mg daily. She denies any side effects. She denies any chest pain, palpitations, headache.  Outpatient Encounter Prescriptions as of 12/15/2011  Medication Sig Dispense Refill  . albuterol (PROAIR HFA) 108 (90 BASE) MCG/ACT inhaler Inhale 2 puffs into the lungs every 6 (six) hours as needed for wheezing.  1 Inhaler  3  . DISCONTD: losartan (COZAAR) 100 MG tablet Take 1 tablet (100 mg total) by mouth daily.  30 tablet  3  . budesonide-formoterol (SYMBICORT) 160-4.5 MCG/ACT inhaler Inhale 2 puffs into the lungs 2 (two) times daily.  1 Inhaler  3  . losartan-hydrochlorothiazide (HYZAAR) 100-12.5 MG per tablet Take 1 tablet by mouth daily.  90 tablet  3    Review of Systems  Constitutional: Negative for fever, chills, appetite change, fatigue and unexpected weight change.  HENT: Negative for ear pain, congestion, neck pain and sinus pressure.   Eyes: Negative for visual disturbance.  Respiratory: Negative for cough, shortness of breath, wheezing and stridor.   Cardiovascular: Negative for chest pain, palpitations and leg swelling.  Gastrointestinal: Negative for abdominal pain.  Genitourinary: Negative for dysuria and flank pain.  Musculoskeletal: Positive for myalgias, arthralgias and gait problem.  Skin:  Negative for color change and rash.  Neurological: Negative for dizziness and headaches.  Hematological: Negative for adenopathy. Does not bruise/bleed easily.  Psychiatric/Behavioral: Negative for suicidal ideas, disturbed wake/sleep cycle and dysphoric mood. The patient is not nervous/anxious.    BP 160/90  Pulse 62  Temp 98.3 F (36.8 C) (Oral)  Ht 5\' 4"  (1.626 m)  Wt 157 lb 12 oz (71.555 kg)  BMI 27.08 kg/m2  SpO2 99%     Objective:   Physical Exam  Constitutional: She is oriented to person, place, and time. She appears well-developed and well-nourished. No distress.  HENT:  Head: Normocephalic and atraumatic.  Right Ear: External ear normal.  Left Ear: External ear normal.  Nose: Nose normal.  Mouth/Throat: Oropharynx is clear and moist. No oropharyngeal exudate.  Eyes: Conjunctivae are normal. Pupils are equal, round, and reactive to light. Right eye exhibits no discharge. Left eye exhibits no discharge. No scleral icterus.  Neck: Normal range of motion. Neck supple. No tracheal deviation present. No thyromegaly present.  Cardiovascular: Normal rate, regular rhythm, normal heart sounds and intact distal pulses.  Exam reveals no gallop and no friction rub.   No murmur heard. Pulmonary/Chest: Effort normal and breath sounds normal. No respiratory distress. She has no wheezes. She has no rales. She exhibits no tenderness.  Musculoskeletal: Normal range of motion. She exhibits no edema and no tenderness.       Right knee: She exhibits abnormal meniscus. She exhibits normal range of motion, no swelling and no erythema.  Lymphadenopathy:    She has no cervical adenopathy.  Neurological: She is alert  and oriented to person, place, and time. No cranial nerve deficit. She exhibits normal muscle tone. Coordination normal.  Skin: Skin is warm and dry. No rash noted. She is not diaphoretic. No erythema. No pallor.  Psychiatric: She has a normal mood and affect. Her behavior is normal.  Judgment and thought content normal.          Assessment & Plan:

## 2012-01-17 ENCOUNTER — Encounter: Payer: Self-pay | Admitting: Internal Medicine

## 2012-01-17 ENCOUNTER — Ambulatory Visit (INDEPENDENT_AMBULATORY_CARE_PROVIDER_SITE_OTHER): Payer: BC Managed Care – PPO | Admitting: Internal Medicine

## 2012-01-17 VITALS — BP 150/90 | HR 65 | Temp 98.7°F | Ht 64.0 in | Wt 160.8 lb

## 2012-01-17 DIAGNOSIS — G252 Other specified forms of tremor: Secondary | ICD-10-CM

## 2012-01-17 DIAGNOSIS — S83206A Unspecified tear of unspecified meniscus, current injury, right knee, initial encounter: Secondary | ICD-10-CM

## 2012-01-17 DIAGNOSIS — IMO0002 Reserved for concepts with insufficient information to code with codable children: Secondary | ICD-10-CM

## 2012-01-17 DIAGNOSIS — I10 Essential (primary) hypertension: Secondary | ICD-10-CM

## 2012-01-17 DIAGNOSIS — G250 Essential tremor: Secondary | ICD-10-CM

## 2012-01-17 MED ORDER — AMLODIPINE BESYLATE 5 MG PO TABS: 5.0000 mg | ORAL_TABLET | Freq: Every day | ORAL | Status: DC

## 2012-01-17 MED ORDER — HYDROCODONE-ACETAMINOPHEN 5-500 MG PO TABS: 1.0000 | ORAL_TABLET | Freq: Four times a day (QID) | ORAL | Status: DC | PRN

## 2012-01-17 NOTE — Progress Notes (Signed)
Subjective:    Patient ID: Allison Brennan, female    DOB: Mar 12, 1966, 46 y.o.   MRN: [id number]  HPI 46 year old female with history of hypertension, right medial meniscal tear causing right knee pain presents for followup. In regards to her right knee pain, she reports symptoms are persistent. She was unable to see orthopedic surgeon as scheduled because of a financial issue with the clinic we referred her to. She reports that symptoms of instability and pain in her right knee are unchanged. She continues to use hydrocodone as needed. She reports minimal improvement with this.  In regards to her hypertension, she reports full compliance with her medications. She has not been regularly checking her blood pressure. She denies any chest pain, headache, palpitations. Next  She is concerned today about tremor, particularly in her left hand. This is worsened with intention. She notices it mostly when she is eating. It is worsened with intake of caffeine. She notes that both her mother and her grandmother had a similar tremor.  Outpatient Encounter Prescriptions as of 01/17/2012  Medication Sig Dispense Refill  . albuterol (PROAIR HFA) 108 (90 BASE) MCG/ACT inhaler Inhale 2 puffs into the lungs every 6 (six) hours as needed for wheezing.  1 Inhaler  3  . budesonide-formoterol (SYMBICORT) 160-4.5 MCG/ACT inhaler Inhale 2 puffs into the lungs 2 (two) times daily.  1 Inhaler  3  . losartan-hydrochlorothiazide (HYZAAR) 100-12.5 MG per tablet Take 1 tablet by mouth daily.  90 tablet  3  . amLODipine (NORVASC) 5 MG tablet Take 1 tablet (5 mg total) by mouth daily.  30 tablet  11  . HYDROcodone-acetaminophen (VICODIN) 5-500 MG per tablet Take 1 tablet by mouth every 6 (six) hours as needed for pain.  60 tablet  2  . DISCONTD: HYDROcodone-acetaminophen (VICODIN) 5-500 MG per tablet TAKE ONE TABLET BY MOUTH EVERY 8 HOURS AS NEEDED FOR PAIN  60 tablet  2    Review of Systems  Constitutional: Negative for  fever, chills, appetite change, fatigue and unexpected weight change.  HENT: Negative for ear pain, congestion, sore throat, trouble swallowing, neck pain, voice change and sinus pressure.   Eyes: Negative for visual disturbance.  Respiratory: Negative for cough, shortness of breath, wheezing and stridor.   Cardiovascular: Negative for chest pain, palpitations and leg swelling.  Gastrointestinal: Negative for nausea, vomiting, abdominal pain, diarrhea, constipation, blood in stool, abdominal distention and anal bleeding.  Genitourinary: Negative for dysuria and flank pain.  Musculoskeletal: Positive for arthralgias. Negative for myalgias and gait problem.  Skin: Negative for color change and rash.  Neurological: Positive for tremors. Negative for dizziness and headaches.  Hematological: Negative for adenopathy. Does not bruise/bleed easily.  Psychiatric/Behavioral: Negative for suicidal ideas, disturbed wake/sleep cycle and dysphoric mood. The patient is not nervous/anxious.    BP 150/90  Pulse 65  Temp 98.7 F (37.1 C) (Oral)  Ht 5\' 4"  (1.626 m)  Wt 160 lb 12 oz (72.916 kg)  BMI 27.59 kg/m2  SpO2 97%     Objective:   Physical Exam  Constitutional: She is oriented to person, place, and time. She appears well-developed and well-nourished. No distress.  HENT:  Head: Normocephalic and atraumatic.  Right Ear: External ear normal.  Left Ear: External ear normal.  Nose: Nose normal.  Mouth/Throat: Oropharynx is clear and moist. No oropharyngeal exudate.  Eyes: Conjunctivae are normal. Pupils are equal, round, and reactive to light. Right eye exhibits no discharge. Left eye exhibits no discharge. No scleral icterus.  Neck: Normal range of motion. Neck supple. No tracheal deviation present. No thyromegaly present.  Cardiovascular: Normal rate, regular rhythm, normal heart sounds and intact distal pulses.  Exam reveals no gallop and no friction rub.   No murmur heard. Pulmonary/Chest:  Effort normal and breath sounds normal. No respiratory distress. She has no wheezes. She has no rales. She exhibits no tenderness.  Musculoskeletal: Normal range of motion. She exhibits no edema and no tenderness.       Right knee: She exhibits MCL laxity. She exhibits normal range of motion and no swelling.  Lymphadenopathy:    She has no cervical adenopathy.  Neurological: She is alert and oriented to person, place, and time. She displays tremor (fine left upper extremity, wosened with intentional movement). No cranial nerve deficit. She exhibits normal muscle tone. Coordination normal.  Skin: Skin is warm and dry. No rash noted. She is not diaphoretic. No erythema. No pallor.  Psychiatric: She has a normal mood and affect. Her behavior is normal. Judgment and thought content normal.          Assessment & Plan:

## 2012-01-17 NOTE — Assessment & Plan Note (Signed)
Pain secondary to meniscal tear. Patient did not go to appointment with orthopedics as scheduled because of a financial issue. Will set her up with alternative orthopedic provider in [place].

## 2012-01-17 NOTE — Assessment & Plan Note (Signed)
Blood pressure is elevated today. Blood pressure likely continues to be elevated because of persistent issues with pain. Addressing pain issues with right knee as below. Will start amlodipine 5 mg daily. Followup one month.

## 2012-01-17 NOTE — Assessment & Plan Note (Signed)
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 We discussed potential referral to neurology, but will hold off for now. If symptoms are persistent or worsening, may consider medication such as beta blocker.

## 2012-02-15 ENCOUNTER — Ambulatory Visit (INDEPENDENT_AMBULATORY_CARE_PROVIDER_SITE_OTHER): Payer: BC Managed Care – PPO | Admitting: Internal Medicine

## 2012-02-15 ENCOUNTER — Encounter: Payer: Self-pay | Admitting: Internal Medicine

## 2012-02-15 VITALS — BP 122/80 | HR 67 | Temp 98.0°F | Ht 64.0 in | Wt 153.2 lb

## 2012-02-15 DIAGNOSIS — S83206A Unspecified tear of unspecified meniscus, current injury, right knee, initial encounter: Secondary | ICD-10-CM

## 2012-02-15 DIAGNOSIS — IMO0002 Reserved for concepts with insufficient information to code with codable children: Secondary | ICD-10-CM

## 2012-02-15 DIAGNOSIS — E876 Hypokalemia: Secondary | ICD-10-CM | POA: Insufficient documentation

## 2012-02-15 DIAGNOSIS — I10 Essential (primary) hypertension: Secondary | ICD-10-CM

## 2012-02-15 MED ORDER — POTASSIUM CHLORIDE CRYS ER 20 MEQ PO TBCR: 20.0000 meq | EXTENDED_RELEASE_TABLET | Freq: Every day | ORAL | Status: DC

## 2012-02-15 NOTE — Assessment & Plan Note (Signed)
Patient with mildly low potassium on previous lab check. Now having some intermittent muscle cramping. Will supplement with 20 mEq of potassium daily x3 days then recheck potassium level. If potassium persistently low, consider stopping hydrochlorothiazide.

## 2012-02-15 NOTE — Assessment & Plan Note (Signed)
Blood pressure improved with use of amlodipine. Will continue. Followup 3 months.

## 2012-02-15 NOTE — Assessment & Plan Note (Signed)
Patient has started evaluation with orthopedics and is status post steroid injection. Gave her a printout of her MRI report today 2 showed orthopedic surgeon. She will also request a CD of this. Followup as needed.

## 2012-02-15 NOTE — Progress Notes (Signed)
Subjective:    Patient ID: Allison Brennan, female    DOB: 22-Aug-1965, 46 y.o.   MRN: [id number]  HPI 46 year old female with history of hypertension and right knee pain presents for followup. In regards to her hypertension, she was recently started on amlodipine. She denies any side effects from this medication. She has not been checking her blood pressure. In regards to her right knee pain, she reports she was seen by orthopedics and had steroid injection. This resulted in minimal improvement in pain. Her orthopedic doctor was not able to access her recent MRI which showed meniscal tear. She has follow up scheduled with her orthopedic physician.  She also notes some recent muscle cramping. She describes this as intermittent cramping in her legs.  Outpatient Encounter Prescriptions as of 02/15/2012  Medication Sig Dispense Refill  . albuterol (PROAIR HFA) 108 (90 BASE) MCG/ACT inhaler Inhale 2 puffs into the lungs every 6 (six) hours as needed for wheezing.  1 Inhaler  3  . amLODipine (NORVASC) 5 MG tablet Take 1 tablet (5 mg total) by mouth daily.  30 tablet  11  . budesonide-formoterol (SYMBICORT) 160-4.5 MCG/ACT inhaler Inhale 2 puffs into the lungs 2 (two) times daily.  1 Inhaler  3  . HYDROcodone-acetaminophen (VICODIN) 5-500 MG per tablet Take 1 tablet by mouth every 6 (six) hours as needed for pain.  60 tablet  2  . losartan-hydrochlorothiazide (HYZAAR) 100-12.5 MG per tablet Take 1 tablet by mouth daily.  90 tablet  3  . potassium chloride SA (K-DUR,KLOR-CON) 20 MEQ tablet Take 1 tablet (20 mEq total) by mouth daily.  7 tablet  0   BP 122/80  Pulse 67  Temp 98 F (36.7 C)  Ht 5\' 4"  (1.626 m)  Wt 153 lb 4 oz (69.514 kg)  BMI 26.31 kg/m2  SpO2 95%  Review of Systems  Constitutional: Negative for fever, chills, appetite change, fatigue and unexpected weight change.  HENT: Negative for ear pain, congestion, sore throat, trouble swallowing, neck pain, voice change and sinus pressure.    Eyes: Negative for visual disturbance.  Respiratory: Negative for cough, shortness of breath, wheezing and stridor.   Cardiovascular: Negative for chest pain, palpitations and leg swelling.  Gastrointestinal: Negative for nausea, vomiting, abdominal pain, diarrhea, constipation, blood in stool, abdominal distention and anal bleeding.  Genitourinary: Negative for dysuria and flank pain.  Musculoskeletal: Positive for myalgias and arthralgias. Negative for gait problem.  Skin: Negative for color change and rash.  Neurological: Negative for dizziness and headaches.  Hematological: Negative for adenopathy. Does not bruise/bleed easily.  Psychiatric/Behavioral: Negative for suicidal ideas, disturbed wake/sleep cycle and dysphoric mood. The patient is not nervous/anxious.        Objective:   Physical Exam  Constitutional: She is oriented to person, place, and time. She appears well-developed and well-nourished. No distress.  HENT:  Head: Normocephalic and atraumatic.  Right Ear: External ear normal.  Left Ear: External ear normal.  Nose: Nose normal.  Mouth/Throat: Oropharynx is clear and moist. No oropharyngeal exudate.  Eyes: Conjunctivae are normal. Pupils are equal, round, and reactive to light. Right eye exhibits no discharge. Left eye exhibits no discharge. No scleral icterus.  Neck: Normal range of motion. Neck supple. No tracheal deviation present. No thyromegaly present.  Cardiovascular: Normal rate, regular rhythm, normal heart sounds and intact distal pulses.  Exam reveals no gallop and no friction rub.   No murmur heard. Pulmonary/Chest: Effort normal and breath sounds normal. No respiratory distress. She has  no wheezes. She has no rales. She exhibits no tenderness.  Musculoskeletal: Normal range of motion. She exhibits no edema and no tenderness.  Lymphadenopathy:    She has no cervical adenopathy.  Neurological: She is alert and oriented to person, place, and time. No cranial  nerve deficit. She exhibits normal muscle tone. Coordination normal.  Skin: Skin is warm and dry. No rash noted. She is not diaphoretic. No erythema. No pallor.  Psychiatric: She has a normal mood and affect. Her behavior is normal. Judgment and thought content normal.          Assessment & Plan:

## 2012-02-19 ENCOUNTER — Other Ambulatory Visit: Payer: BC Managed Care – PPO

## 2012-02-26 ENCOUNTER — Other Ambulatory Visit: Payer: Self-pay | Admitting: Orthopedic Surgery

## 2012-02-26 MED ORDER — DEXAMETHASONE SODIUM PHOSPHATE 10 MG/ML IJ SOLN: 10.0000 mg | Freq: Once | INTRAMUSCULAR | Status: DC

## 2012-02-26 NOTE — Progress Notes (Signed)
Preoperative surgical orders have been place into the [facility] hospital system for Allison Brennan on 02/26/2012, 8:23 AM  by Patrica Duel for surgery on 03/13/2012.  Preop Knee Scope orders including IV Tylenol and IV Decadron as long as there are no contraindications to the above medications. Allison Peace, PA-C

## 2012-02-28 ENCOUNTER — Encounter (HOSPITAL_COMMUNITY): Payer: Self-pay | Admitting: Pharmacy Technician

## 2012-03-08 ENCOUNTER — Encounter (HOSPITAL_COMMUNITY): Payer: Self-pay

## 2012-03-08 ENCOUNTER — Ambulatory Visit (HOSPITAL_COMMUNITY)
Admission: RE | Admit: 2012-03-08 | Discharge: 2012-03-08 | Disposition: A | Discharge status: Home / Self Care | Payer: BC Managed Care – PPO | Source: Ambulatory Visit | Attending: Orthopedic Surgery | Admitting: Orthopedic Surgery

## 2012-03-08 ENCOUNTER — Encounter (HOSPITAL_COMMUNITY)
Admission: RE | Admit: 2012-03-08 | Discharge: 2012-03-08 | Disposition: A | Discharge status: Home / Self Care | Payer: BC Managed Care – PPO | Source: Ambulatory Visit | Attending: Orthopedic Surgery | Admitting: Orthopedic Surgery

## 2012-03-08 DIAGNOSIS — M47814 Spondylosis without myelopathy or radiculopathy, thoracic region: Secondary | ICD-10-CM | POA: Insufficient documentation

## 2012-03-08 DIAGNOSIS — IMO0002 Reserved for concepts with insufficient information to code with codable children: Secondary | ICD-10-CM | POA: Insufficient documentation

## 2012-03-08 DIAGNOSIS — X58XXXA Exposure to other specified factors, initial encounter: Secondary | ICD-10-CM | POA: Insufficient documentation

## 2012-03-08 DIAGNOSIS — I10 Essential (primary) hypertension: Secondary | ICD-10-CM | POA: Insufficient documentation

## 2012-03-08 DIAGNOSIS — F17200 Nicotine dependence, unspecified, uncomplicated: Secondary | ICD-10-CM | POA: Insufficient documentation

## 2012-03-08 DIAGNOSIS — J449 Chronic obstructive pulmonary disease, unspecified: Secondary | ICD-10-CM | POA: Insufficient documentation

## 2012-03-08 DIAGNOSIS — Z01812 Encounter for preprocedural laboratory examination: Secondary | ICD-10-CM | POA: Insufficient documentation

## 2012-03-08 DIAGNOSIS — J4489 Other specified chronic obstructive pulmonary disease: Secondary | ICD-10-CM | POA: Insufficient documentation

## 2012-03-08 DIAGNOSIS — Z01810 Encounter for preprocedural cardiovascular examination: Secondary | ICD-10-CM | POA: Insufficient documentation

## 2012-03-08 HISTORY — DX: Essential (primary) hypertension: I10

## 2012-03-08 HISTORY — DX: Nonspecific reaction to tuberculin skin test without active tuberculosis: R76.11

## 2012-03-08 HISTORY — DX: Headache: R51

## 2012-03-08 LAB — BASIC METABOLIC PANEL
BUN: 10 mg/dL (ref 6–23)
CO2: 30 mEq/L (ref 19–32)
Calcium: 9.8 mg/dL (ref 8.4–10.5)
Chloride: 102 mEq/L (ref 96–112)
Creatinine, Ser: 0.61 mg/dL (ref 0.50–1.10)
GFR calc Af Amer: >90 mL/min (ref >90)
GFR calc non Af Amer: >90 mL/min (ref >90)
Glucose, Bld: 105 mg/dL — ABNORMAL HIGH (ref 70–99)
Potassium: 2.7 mEq/L — CL (ref 3.5–5.1)
Sodium: 143 mEq/L (ref 135–145)

## 2012-03-08 LAB — CBC
HCT: 37.2 % (ref 36.0–46.0)
Hemoglobin: 13 g/dL (ref 12.0–15.0)
MCH: 31.4 pg (ref 26.0–34.0)
MCHC: 34.9 g/dL (ref 30.0–36.0)
MCV: 89.9 fL (ref 78.0–100.0)
Platelets: 252 10*3/uL (ref 150–400)
RBC: 4.14 MIL/uL (ref 3.87–5.11)
RDW: 13.1 % (ref 11.5–15.5)
WBC: 7 10*3/uL (ref 4.0–10.5)

## 2012-03-08 LAB — SURGICAL PCR SCREEN
MRSA, PCR: NEGATIVE
Staphylococcus aureus: NEGATIVE

## 2012-03-08 NOTE — Progress Notes (Signed)
Spoke with brad dixon pa by phone made aware potassium 2.7, brad dixon will call office and make aware.

## 2012-03-08 NOTE — Progress Notes (Signed)
Paged dr Ranell Patrick 03-08-2012 at 1743 pm

## 2012-03-08 NOTE — Progress Notes (Signed)
Paged dr Ranell Patrick 03-08-2012 at 1715pm

## 2012-03-08 NOTE — Patient Instructions (Addendum)
20 MINERVIA OSSO  03/08/2012   Your procedure is scheduled on:  03/13/12  Wednesday  SURGERY 1030-1100  Report to [facility] at   0730    AM.  Call this number if you have problems the morning of surgery: [phone]     Or PST   [id number]  Deliah Goody   Remember: Janan Halter WITH YOU TO HOSPITAL  Do not eat food or drink any fluids :After Midnight.  Tuesday NIGHT  Take these medicines the morning of surgery with A SIP OF WATER:  ALBUTEROL, SYMBICORT,    AMLODIPINE                                          MAY TAKE NORCO IF NEEDED   Do not wear jewelry, make-up or nail polish.  Do not wear lotions, powders, or perfumes. You may wear deodorant.  Do not shave 48 hours prior to surgery.  Do not bring valuables to the hospital.  Contacts, dentures or bridgework may not be worn into surgery.  Leave suitcase in the car. After surgery it may be brought to your room.  For patients admitted to the hospital, checkout time is 11:00 AM the day of discharge.   Patients discharged the day of surgery will not be allowed to drive home.  Name and phone number of your driver:daughter                                                                      Special Instructions: CHG Shower Use Special Wash: 1/2 bottle night before surgery and 1/2 bottle morning of surgery. REGULAR SOAP FACE AND PRIVATES              LADIES- NO SHAVING 48 HOURS BEFORE USING BETASEPT SOAP.                   Please read over the following fact sheets that you were given: MRSA Information

## 2012-03-13 ENCOUNTER — Encounter (HOSPITAL_COMMUNITY): Payer: Self-pay | Admitting: *Deleted

## 2012-03-13 ENCOUNTER — Ambulatory Visit (HOSPITAL_COMMUNITY): Payer: BC Managed Care – PPO | Admitting: Anesthesiology

## 2012-03-13 ENCOUNTER — Encounter (HOSPITAL_COMMUNITY)
Admission: RE | Disposition: A | Discharge status: Home / Self Care | Payer: Self-pay | Source: Ambulatory Visit | Attending: Orthopedic Surgery

## 2012-03-13 ENCOUNTER — Ambulatory Visit (HOSPITAL_COMMUNITY)
Admission: RE | Admit: 2012-03-13 | Discharge: 2012-03-13 | Disposition: A | Discharge status: Home / Self Care | Payer: BC Managed Care – PPO | Source: Ambulatory Visit | Attending: Orthopedic Surgery | Admitting: Orthopedic Surgery

## 2012-03-13 ENCOUNTER — Encounter (HOSPITAL_COMMUNITY): Payer: Self-pay | Admitting: Anesthesiology

## 2012-03-13 DIAGNOSIS — J449 Chronic obstructive pulmonary disease, unspecified: Secondary | ICD-10-CM | POA: Insufficient documentation

## 2012-03-13 DIAGNOSIS — M2390 Unspecified internal derangement of unspecified knee: Secondary | ICD-10-CM | POA: Insufficient documentation

## 2012-03-13 DIAGNOSIS — X58XXXA Exposure to other specified factors, initial encounter: Secondary | ICD-10-CM | POA: Insufficient documentation

## 2012-03-13 DIAGNOSIS — J4489 Other specified chronic obstructive pulmonary disease: Secondary | ICD-10-CM | POA: Insufficient documentation

## 2012-03-13 DIAGNOSIS — S83206A Unspecified tear of unspecified meniscus, current injury, right knee, initial encounter: Secondary | ICD-10-CM

## 2012-03-13 DIAGNOSIS — IMO0002 Reserved for concepts with insufficient information to code with codable children: Secondary | ICD-10-CM | POA: Insufficient documentation

## 2012-03-13 DIAGNOSIS — Z79899 Other long term (current) drug therapy: Secondary | ICD-10-CM | POA: Insufficient documentation

## 2012-03-13 DIAGNOSIS — I10 Essential (primary) hypertension: Secondary | ICD-10-CM | POA: Insufficient documentation

## 2012-03-13 HISTORY — PX: KNEE ARTHROSCOPY: SHX127

## 2012-03-13 LAB — POTASSIUM: Potassium: 2.5 mEq/L — CL (ref 3.5–5.1)

## 2012-03-13 SURGERY — ARTHROSCOPY, KNEE
Anesthesia: Spinal | Site: Knee | Laterality: Right | Wound class: Clean

## 2012-03-13 MED ORDER — LACTATED RINGERS IV SOLN
INTRAVENOUS | Status: DC | PRN
  Administered 2012-03-13: 09:00:00 via INTRAVENOUS

## 2012-03-13 MED ORDER — ACETAMINOPHEN 10 MG/ML IV SOLN: 1000.0000 mg | Freq: Once | INTRAVENOUS | Status: DC | PRN

## 2012-03-13 MED ORDER — OXYCODONE-ACETAMINOPHEN 5-325 MG PO TABS: 1.0000 | ORAL_TABLET | ORAL | Status: AC | PRN

## 2012-03-13 MED ORDER — OXYCODONE HCL 5 MG PO TABS
5.0000 mg | ORAL_TABLET | Freq: Once | ORAL | Status: AC | PRN
  Administered 2012-03-13: 5 mg via ORAL
  Filled 2012-03-13: qty 1

## 2012-03-13 MED ORDER — METHOCARBAMOL 500 MG PO TABS: 500.0000 mg | ORAL_TABLET | Freq: Four times a day (QID) | ORAL | Status: AC

## 2012-03-13 MED ORDER — BUPIVACAINE-EPINEPHRINE PF 0.25-1:200000 % IJ SOLN
INTRAMUSCULAR | Status: AC
  Filled 2012-03-13: qty 30

## 2012-03-13 MED ORDER — POTASSIUM CHLORIDE 10 MEQ/100ML IV SOLN: INTRAVENOUS | Status: DC | PRN

## 2012-03-13 MED ORDER — FENTANYL CITRATE 0.05 MG/ML IJ SOLN
INTRAMUSCULAR | Status: DC | PRN
  Administered 2012-03-13: 50 ug via INTRAVENOUS

## 2012-03-13 MED ORDER — SODIUM CHLORIDE 0.9 % IV SOLN
INTRAVENOUS | Status: DC | PRN
  Administered 2012-03-13: 09:00:00 via INTRAVENOUS

## 2012-03-13 MED ORDER — CEFAZOLIN SODIUM-DEXTROSE 2-3 GM-% IV SOLR
INTRAVENOUS | Status: DC | PRN
  Administered 2012-03-13: 2 g via INTRAVENOUS

## 2012-03-13 MED ORDER — MEPERIDINE HCL 50 MG/ML IJ SOLN: 6.2500 mg | INTRAMUSCULAR | Status: DC | PRN

## 2012-03-13 MED ORDER — ACETAMINOPHEN 10 MG/ML IV SOLN
INTRAVENOUS | Status: AC
  Filled 2012-03-13: qty 100

## 2012-03-13 MED ORDER — HYDROMORPHONE HCL PF 1 MG/ML IJ SOLN
0.2500 mg | INTRAMUSCULAR | Status: DC | PRN
  Administered 2012-03-13 (×2): 0.5 mg via INTRAVENOUS

## 2012-03-13 MED ORDER — LIDOCAINE HCL (CARDIAC) 20 MG/ML IV SOLN
INTRAVENOUS | Status: DC | PRN
  Administered 2012-03-13: 50 mg via INTRAVENOUS

## 2012-03-13 MED ORDER — PROPOFOL 10 MG/ML IV BOLUS
INTRAVENOUS | Status: DC | PRN
  Administered 2012-03-13: 180 mg via INTRAVENOUS

## 2012-03-13 MED ORDER — CEFAZOLIN SODIUM 1-5 GM-% IV SOLN
INTRAVENOUS | Status: AC
  Filled 2012-03-13: qty 100

## 2012-03-13 MED ORDER — HYDROMORPHONE HCL PF 1 MG/ML IJ SOLN
INTRAMUSCULAR | Status: AC
  Filled 2012-03-13: qty 1

## 2012-03-13 MED ORDER — PROMETHAZINE HCL 25 MG/ML IJ SOLN: 6.2500 mg | INTRAMUSCULAR | Status: DC | PRN

## 2012-03-13 MED ORDER — OXYCODONE HCL 5 MG/5ML PO SOLN
5.0000 mg | Freq: Once | ORAL | Status: AC | PRN
  Filled 2012-03-13: qty 5

## 2012-03-13 MED ORDER — ACETAMINOPHEN 10 MG/ML IV SOLN
1000.0000 mg | Freq: Once | INTRAVENOUS | Status: AC
  Administered 2012-03-13: 1000 mg via INTRAVENOUS

## 2012-03-13 MED ORDER — DEXAMETHASONE SODIUM PHOSPHATE 10 MG/ML IJ SOLN
INTRAMUSCULAR | Status: DC | PRN
  Administered 2012-03-13: 10 mg via INTRAVENOUS

## 2012-03-13 MED ORDER — POTASSIUM CHLORIDE 10 MEQ/100ML IV SOLN
INTRAVENOUS | Status: DC | PRN
  Administered 2012-03-13: 10 meq via INTRAVENOUS

## 2012-03-13 MED ORDER — MIDAZOLAM HCL 5 MG/5ML IJ SOLN
INTRAMUSCULAR | Status: DC | PRN
  Administered 2012-03-13: 2 mg via INTRAVENOUS

## 2012-03-13 MED ORDER — BUPIVACAINE-EPINEPHRINE 0.25% -1:200000 IJ SOLN
INTRAMUSCULAR | Status: DC | PRN
  Administered 2012-03-13: 20 mL

## 2012-03-13 MED ORDER — DEXTROSE 5 % IV SOLN
3.0000 g | INTRAVENOUS | Status: DC
  Filled 2012-03-13: qty 3000

## 2012-03-13 MED ORDER — POTASSIUM CHLORIDE 10 MEQ/100ML IV SOLN
10.0000 meq | INTRAVENOUS | Status: AC
  Filled 2012-03-13: qty 100

## 2012-03-13 MED ORDER — FENTANYL CITRATE 0.05 MG/ML IJ SOLN
INTRAMUSCULAR | Status: AC
  Administered 2012-03-13: 50 ug
  Administered 2012-03-13 (×2): 25 ug
  Filled 2012-03-13: qty 2

## 2012-03-13 MED ORDER — SODIUM CHLORIDE 0.9 % IV SOLN: INTRAVENOUS | Status: DC

## 2012-03-13 MED ORDER — ONDANSETRON HCL 4 MG/2ML IJ SOLN
INTRAMUSCULAR | Status: DC | PRN
  Administered 2012-03-13: 4 mg via INTRAVENOUS

## 2012-03-13 MED ORDER — LACTATED RINGERS IR SOLN
Status: DC | PRN
  Administered 2012-03-13: 1

## 2012-03-13 SURGICAL SUPPLY — 27 items
BANDAGE ELASTIC 6 VELCRO ST LF (GAUZE/BANDAGES/DRESSINGS) ×2 IMPLANT
BLADE 4.2CUDA (BLADE) ×2 IMPLANT
CLOTH BEACON ORANGE TIMEOUT ST (SAFETY) ×2 IMPLANT
CUFF TOURN SGL QUICK 34 (TOURNIQUET CUFF) ×1
CUFF TRNQT CYL 34X4X40X1 (TOURNIQUET CUFF) ×1 IMPLANT
DRAPE U-SHAPE 47X51 STRL (DRAPES) ×2 IMPLANT
DRSG ADAPTIC 3X8 NADH LF (GAUZE/BANDAGES/DRESSINGS) ×2 IMPLANT
DRSG EMULSION OIL 3X3 NADH (GAUZE/BANDAGES/DRESSINGS) ×2 IMPLANT
DRSG PAD ABDOMINAL 8X10 ST (GAUZE/BANDAGES/DRESSINGS) ×2 IMPLANT
DURAPREP 26ML APPLICATOR (WOUND CARE) ×2 IMPLANT
GLOVE BIO SURGEON STRL SZ7.5 (GLOVE) ×2 IMPLANT
GLOVE BIO SURGEON STRL SZ8 (GLOVE) ×2 IMPLANT
GLOVE BIOGEL PI IND STRL 8 (GLOVE) ×1 IMPLANT
GLOVE BIOGEL PI INDICATOR 8 (GLOVE) ×1
GOWN STRL NON-REIN LRG LVL3 (GOWN DISPOSABLE) ×2 IMPLANT
MANIFOLD NEPTUNE II (INSTRUMENTS) ×4 IMPLANT
PACK ARTHROSCOPY WL (CUSTOM PROCEDURE TRAY) ×2 IMPLANT
PACK ICE MAXI GEL EZY WRAP (MISCELLANEOUS) ×6 IMPLANT
PADDING CAST COTTON 6X4 STRL (CAST SUPPLIES) ×2 IMPLANT
POSITIONER SURGICAL ARM (MISCELLANEOUS) ×2 IMPLANT
SET ARTHROSCOPY TUBING (MISCELLANEOUS) ×1
SET ARTHROSCOPY TUBING LN (MISCELLANEOUS) ×1 IMPLANT
SPONGE GAUZE 4X4 12PLY (GAUZE/BANDAGES/DRESSINGS) ×2 IMPLANT
SUT ETHILON 4 0 PS 2 18 (SUTURE) ×2 IMPLANT
TOWEL OR 17X26 10 PK STRL BLUE (TOWEL DISPOSABLE) ×2 IMPLANT
WAND 90 DEG TURBOVAC W/CORD (SURGICAL WAND) ×2 IMPLANT
WRAP KNEE MAXI GEL POST OP (GAUZE/BANDAGES/DRESSINGS) ×4 IMPLANT

## 2012-03-13 NOTE — Transfer of Care (Signed)
Immediate Anesthesia Transfer of Care Note  Patient: Allison Brennan  Procedure(s) Performed: Procedure(s) (LRB) with comments: ARTHROSCOPY KNEE (Right) - debridementt right medial meniscus/chrondroplasty  Patient Location: PACU  Anesthesia Type: General  Level of Consciousness: sedated  Airway & Oxygen Therapy: Patient Spontanous Breathing and Patient connected to face mask oxygen  Post-op Assessment: Report given to PACU RN and Post -op Vital signs reviewed and stable  Post vital signs: Reviewed and stable  Complications: No apparent anesthesia complications

## 2012-03-13 NOTE — Op Note (Signed)
Preoperative diagnosis-  Right knee medial meniscal tear  Postoperative diagnosis Right- knee medial meniscal tear Plus Right medial femoral chondral defect  Procedure- Right knee arthroscopy with medial Meniscal debridement and chondroplasty   Surgeon- Gus Rankin. Texanna Hilburn, MD  Anesthesia-General  EBL-  minimal Complications- None  Condition- PACU - hemodynamically stable.  Brief clinical note- -Allison Brennan is a 46 y.o.  female with a several month history of right knee pain and mechanical symptoms. Exam and history suggested medial meniscal tear confirmed by MRI. The patient presents now for arthroscopy and debridement   Procedure in detail -       After successful administration of General anesthetic, a tourmiquet is placed high on the Right  thigh and the Right lower extremity is prepped and draped in the usual sterile fashion. Time out is performed by the surgical team. Standard superomedial and inferolateral portal sites are marked and incisions made with an 11 blade. The inflow cannula is passed through the superomedial portal and camera through the inferolateral portal and inflow is initiated. Arthroscopic visualization proceeds.      The undersurface of the patella and trochlea are visualized and they are normal. The medial and lateral gutters are visualized and there are  no loose bodies. Flexion and valgus force is applied to the knee and the medial compartment is entered. A spinal needle is passed into the joint through the site marked for the inferomedial portal. A small incision is made and the dilator passed into the joint. The findings for the medial compartment are tear of the body and posterior horn of the medial meniscus and 1 x 2 cm chondral defect medial femoral condyle . The tear is debrided to a stable base with baskets and a shaver and sealed off with the Arthrocare. The shaver is used to debride the unstable cartilage to a stable bony base with stable edges. It is probed  and found to be stable.    The intercondylar notch is visualized and the ACL appears normal. The lateral compartment is entered and the findings are normal .      The joint is again inspected and there are no other tears, defects or loose bodies identified. The arthroscopic equipment is then removed from the inferior portals which are closed with interrupted 4-0 nylon. 20 ml of .25% Marcaine with epinephrine are injected through the inflow cannula and the cannula is then removed and the portal closed with nylon. The incisions are cleaned and dried and a bulky sterile dressing is applied. The patient is then awakened and transported to recovery in stable condition.   03/13/2012, 10:15 AM

## 2012-03-13 NOTE — Anesthesia Postprocedure Evaluation (Signed)
Anesthesia Post Note  Patient: Allison Brennan  Procedure(s) Performed: Procedure(s) (LRB): ARTHROSCOPY KNEE (Right)  Anesthesia type: General  Patient location: PACU  Post pain: Pain level controlled  Post assessment: Post-op Vital signs reviewed  Last Vitals: BP 147/82  Pulse 70  Temp 36.7 C (Oral)  Resp 18  SpO2 96%  Post vital signs: Reviewed  Level of consciousness: sedated  Complications: No apparent anesthesia complications

## 2012-03-13 NOTE — H&P (Signed)
  CC- Allison Brennan is a 46 y.o. female who presents with right knee pain.  HPI- . Knee Pain: Patient presents with knee pain involving the  right knee. Onset of the symptoms was several months ago. Inciting event: none known. Current symptoms include giving out, pain located medially, stiffness and swelling. Pain is aggravated by pivoting, rising after sitting, squatting and standing.  Patient has had no prior knee problems. Evaluation to date: MRI: abnormal medial meniscal tear. Treatment to date: corticosteroid injection which was ineffective.  Past Medical History  Diagnosis Date  . Back pain, lumbosacral   . Hypertension   . PPD positive, treated 1998  . Headache   . COPD (chronic obstructive pulmonary disease)     no pulmonologist    Past Surgical History  Procedure Date  . Partial hysterectomy 1999    pelvic pain  . Cesarean section     x2    Prior to Admission medications   Medication Sig Start Date End Date Taking? Authorizing Provider  albuterol (PROVENTIL HFA;VENTOLIN HFA) 108 (90 BASE) MCG/ACT inhaler Inhale 1 puff into the lungs 2 (two) times daily.   Yes Historical Provider, MD  amLODipine (NORVASC) 5 MG tablet Take 5 mg by mouth daily before breakfast.   Yes Historical Provider, MD  Aspirin-Caffeine (BC FAST PAIN RELIEF PO) Take 1 packet by mouth every 8 (eight) hours as needed. Pain   Yes Historical Provider, MD  budesonide-formoterol (SYMBICORT) 160-4.5 MCG/ACT inhaler Inhale 1 puff into the lungs daily.   Yes Historical Provider, MD  HYDROcodone-acetaminophen (NORCO/VICODIN) 5-325 MG per tablet Take 1 tablet by mouth every 6 (six) hours as needed. Pain   Yes Historical Provider, MD  losartan-hydrochlorothiazide (HYZAAR) 100-12.5 MG per tablet Take 1 tablet by mouth daily before breakfast.   Yes Historical Provider, MD   KNEE EXAM soft tissue tenderness over medial joint line, reduced range of motion, collateral ligaments intact, normal ipsilateral hip  exam  Physical Examination: General appearance - alert, well appearing, and in no distress Mental status - alert, oriented to person, place, and time Chest - clear to auscultation, no wheezes, rales or rhonchi, symmetric air entry Heart - normal rate, regular rhythm, normal S1, S2, no murmurs, rubs, clicks or gallops Abdomen - soft, nontender, nondistended, no masses or organomegaly Neurological - alert, oriented, normal speech, no focal findings or movement disorder noted   Asessment/Plan--- Right knee medial meniscal tear- - Plan right knee arthroscopy with meniscal debridement. Procedure risks and potential comps discussed with patient who elects to proceed. Goals are decreased pain and increased function with a high likelihood of achieving both

## 2012-03-13 NOTE — Progress Notes (Signed)
Orthopedic tech here to educate pt in  use of crutches. She does teach back. Able to ambulate with use of crutches.

## 2012-03-13 NOTE — Interval H&P Note (Signed)
History and Physical Interval Note:  03/13/2012 9:30 AM  Allison Brennan  has presented today for surgery, with the diagnosis of Right Knee Medial Mensical Tear  The various methods of treatment have been discussed with the patient and family. After consideration of risks, benefits and other options for treatment, the patient has consented to  Procedure(s) (LRB) with comments: ARTHROSCOPY KNEE (Right) - debridment meniscus right as a surgical intervention .  The patient's history has been reviewed, patient examined, no change in status, stable for surgery.  I have reviewed the patient's chart and labs.  Questions were answered to the patient's satisfaction.     Loanne Drilling

## 2012-03-13 NOTE — Anesthesia Preprocedure Evaluation (Addendum)
Anesthesia Evaluation  Patient identified by MRN, date of birth, ID band Patient awake    Reviewed: Allergy & Precautions, H&P , NPO status , Patient's Chart, lab work & pertinent test results  Airway Mallampati: I TM Distance: >3 FB Neck ROM: Full    Dental  (+) Poor Dentition and Dental Advisory Given   Pulmonary COPD COPD inhaler,  breath sounds clear to auscultation- rhonchi  Pulmonary exam normal       Cardiovascular hypertension, Pt. on medications Rhythm:Regular Rate:Normal     Neuro/Psych  Headaches,    GI/Hepatic negative GI ROS, Neg liver ROS,   Endo/Other  negative endocrine ROS  Renal/GU negative Renal ROS     Musculoskeletal   Abdominal (+) - obese,   Peds  Hematology negative hematology ROS (+)   Anesthesia Other Findings   Reproductive/Obstetrics                          Anesthesia Physical Anesthesia Plan  ASA: II  Anesthesia Plan: General   Post-op Pain Management:    Induction: Intravenous  Airway Management Planned: LMA  Additional Equipment:   Intra-op Plan:   Post-operative Plan: Extubation in OR  Informed Consent: I have reviewed the patients History and Physical, chart, labs and discussed the procedure including the risks, benefits and alternatives for the proposed anesthesia with the patient or authorized representative who has indicated his/her understanding and acceptance.   Dental advisory given  Plan Discussed with: CRNA and Surgeon  Anesthesia Plan Comments:        Anesthesia Quick Evaluation

## 2012-03-14 LAB — POCT I-STAT EG7
Acid-Base Excess: 7 mmol/L — ABNORMAL HIGH (ref 0.0–2.0)
Bicarbonate: 31.8 mEq/L — ABNORMAL HIGH (ref 20.0–24.0)
Calcium, Ion: 1.18 mmol/L (ref 1.12–1.23)
HCT: 36 % (ref 36.0–46.0)
Hemoglobin: 12.2 g/dL (ref 12.0–15.0)
O2 Saturation: 69 %
Potassium: 2.6 mEq/L — CL (ref 3.5–5.1)
Sodium: 144 mEq/L (ref 135–145)
TCO2: 33 mmol/L (ref 0–100)
pCO2, Ven: 46.7 mmHg (ref 45.0–50.0)
pH, Ven: 7.442 — ABNORMAL HIGH (ref 7.250–7.300)
pO2, Ven: 35 mmHg (ref 30.0–45.0)

## 2012-03-15 ENCOUNTER — Encounter (HOSPITAL_COMMUNITY): Payer: Self-pay | Admitting: Orthopedic Surgery

## 2012-05-09 ENCOUNTER — Ambulatory Visit: Payer: BC Managed Care – PPO | Admitting: Internal Medicine

## 2012-05-29 ENCOUNTER — Encounter: Payer: Self-pay | Admitting: Internal Medicine

## 2012-05-29 ENCOUNTER — Ambulatory Visit (INDEPENDENT_AMBULATORY_CARE_PROVIDER_SITE_OTHER): Payer: BC Managed Care – PPO | Admitting: Internal Medicine

## 2012-05-29 VITALS — BP 138/66 | HR 76 | Temp 97.7°F | Resp 15 | Wt 157.5 lb

## 2012-05-29 DIAGNOSIS — Z1239 Encounter for other screening for malignant neoplasm of breast: Secondary | ICD-10-CM

## 2012-05-29 DIAGNOSIS — S83206A Unspecified tear of unspecified meniscus, current injury, right knee, initial encounter: Secondary | ICD-10-CM

## 2012-05-29 DIAGNOSIS — M545 Low back pain, unspecified: Secondary | ICD-10-CM

## 2012-05-29 DIAGNOSIS — E876 Hypokalemia: Secondary | ICD-10-CM

## 2012-05-29 DIAGNOSIS — IMO0002 Reserved for concepts with insufficient information to code with codable children: Secondary | ICD-10-CM

## 2012-05-29 DIAGNOSIS — J40 Bronchitis, not specified as acute or chronic: Secondary | ICD-10-CM

## 2012-05-29 DIAGNOSIS — I10 Essential (primary) hypertension: Secondary | ICD-10-CM

## 2012-05-29 MED ORDER — HYDROCOD POLST-CHLORPHEN POLST 10-8 MG/5ML PO LQCR: 5.0000 mL | Freq: Two times a day (BID) | ORAL | Status: DC | PRN

## 2012-05-29 MED ORDER — PREDNISONE (PAK) 10 MG PO TABS: ORAL_TABLET | ORAL | Status: DC

## 2012-05-29 MED ORDER — AZITHROMYCIN 250 MG PO TABS: ORAL_TABLET | ORAL | Status: DC

## 2012-05-29 NOTE — Assessment & Plan Note (Signed)
Persistent low back pain. Pt reports she was seen by orthopedics, Dr. Avel Peace. Will request records on evaluation including recent MRI.

## 2012-05-29 NOTE — Assessment & Plan Note (Signed)
S/p repair by Dr. Despina Hick.Having some persistent pain and swelling. Encouraged her to follow up with Dr. Despina Hick for repeat exam and evaluation given persistent pain/swelling.

## 2012-05-29 NOTE — Assessment & Plan Note (Signed)
Potassium noted to be low on recent labs. Likely secondary to use of HCTZ. Will repeat K with labs today. If persistently low, stop HCTZ.

## 2012-05-29 NOTE — Assessment & Plan Note (Signed)
Symptoms and exam most consistent with bronchitis. Will treat with prednisone taper and azithromycin. Pt will use tussionex for cough. Follow up if symptoms not improving over next 72hr.

## 2012-05-29 NOTE — Progress Notes (Signed)
Subjective:    Patient ID: Allison Brennan, female    DOB: 1966-04-18, 46 y.o.   MRN: [id number]  HPI 46 year old female with history of COPD, hypertension, and recent episode of right medial meniscal tear presents for followup. In regards to her right knee, she reports that over the last few days she has had increasing pain and swelling. She underwent repair of right medial meniscal tear couple of months ago. She initially had improvement in symptoms but recently symptoms have worsened. She notes that she contacted her orthopedic surgeon and he has started her on tramadol to help with pain. She reports no improvement with this. She reports she is having difficulty standing at work because of pain and swelling in her right knee. She has been using an Ace bandage with minimal improvement.  She is also concerned today because of a three-day history of cough, shortness of breath, and clear nasal drainage. She denies any fever chills. She has not been taking any medication for her symptoms. She is a current smoker. She reports compliance with her inhalers including Symbicort and albuterol.  She is also concerned about chronic low back pain. She reports she was seen by a back pain specialist in orthopedics. She had one epidural steroid injection and reports improvement for a couple of days with this intervention. She is not currently taking any chronic medications for back pain. Pain is described as aching in her lower back which is worsened by prolonged physical activity.  Outpatient Encounter Prescriptions as of 05/29/2012  Medication Sig Dispense Refill  . albuterol (PROVENTIL HFA;VENTOLIN HFA) 108 (90 BASE) MCG/ACT inhaler Inhale 1 puff into the lungs 2 (two) times daily.      Marland Kitchen amLODipine (NORVASC) 5 MG tablet Take 5 mg by mouth daily before breakfast.      . Aspirin-Caffeine (BC FAST PAIN RELIEF PO) Take 1 packet by mouth every 8 (eight) hours as needed. Pain      . budesonide-formoterol (SYMBICORT)  160-4.5 MCG/ACT inhaler Inhale 1 puff into the lungs daily.      Marland Kitchen losartan-hydrochlorothiazide (HYZAAR) 100-12.5 MG per tablet Take 1 tablet by mouth daily before breakfast.      . azithromycin (ZITHROMAX Z-PAK) 250 MG tablet Take 2 pills day 1, then 1 pill daily days 2-5  6 each  0  . chlorpheniramine-HYDROcodone (TUSSIONEX) 10-8 MG/5ML LQCR Take 5 mLs by mouth every 12 (twelve) hours as needed.  140 mL  0  . predniSONE (STERAPRED UNI-PAK) 10 MG tablet Take 60mg  day 1 then taper by 10mg  daily  21 tablet  0   BP 138/66  Pulse 76  Temp 97.7 F (36.5 C) (Oral)  Resp 15  Wt 157 lb 8 oz (71.442 kg)  Review of Systems  Constitutional: Negative for fever, chills, appetite change, fatigue and unexpected weight change.  HENT: Positive for rhinorrhea, sneezing and postnasal drip. Negative for ear pain, congestion, sore throat, trouble swallowing, neck pain, voice change and sinus pressure.   Eyes: Negative for visual disturbance.  Respiratory: Positive for cough and shortness of breath. Negative for wheezing and stridor.   Cardiovascular: Negative for chest pain, palpitations and leg swelling.  Gastrointestinal: Negative for nausea, vomiting, abdominal pain, diarrhea, constipation, blood in stool, abdominal distention and anal bleeding.  Genitourinary: Negative for dysuria and flank pain.  Musculoskeletal: Positive for myalgias, back pain and arthralgias. Negative for gait problem.  Skin: Negative for color change and rash.  Neurological: Negative for dizziness and headaches.  Hematological: Negative for adenopathy.  Does not bruise/bleed easily.  Psychiatric/Behavioral: Negative for suicidal ideas, sleep disturbance and dysphoric mood. The patient is not nervous/anxious.        Objective:   Physical Exam  Constitutional: She is oriented to person, place, and time. She appears well-developed and well-nourished. No distress.  HENT:  Head: Normocephalic and atraumatic.  Right Ear: External ear  normal.  Left Ear: External ear normal.  Nose: Nose normal.  Mouth/Throat: Oropharynx is clear and moist. No oropharyngeal exudate.  Eyes: Conjunctivae normal are normal. Pupils are equal, round, and reactive to light. Right eye exhibits no discharge. Left eye exhibits no discharge. No scleral icterus.  Neck: Normal range of motion. Neck supple. No tracheal deviation present. No thyromegaly present.  Cardiovascular: Normal rate, regular rhythm, normal heart sounds and intact distal pulses.  Exam reveals no gallop and no friction rub.   No murmur heard. Pulmonary/Chest: Effort normal. No accessory muscle usage. Not tachypneic. No respiratory distress. She has decreased breath sounds (prolonged exp). She has no wheezes. Rhonchi: scattered. She has no rales. She exhibits no tenderness.  Musculoskeletal: She exhibits no edema and no tenderness.       Right knee: She exhibits swelling. She exhibits normal range of motion. tenderness found. Medial joint line tenderness noted.       Lumbar back: She exhibits pain. She exhibits normal range of motion and no tenderness.  Lymphadenopathy:    She has no cervical adenopathy.  Neurological: She is alert and oriented to person, place, and time. No cranial nerve deficit. She exhibits normal muscle tone. Coordination normal.  Skin: Skin is warm and dry. No rash noted. She is not diaphoretic. No erythema. No pallor.  Psychiatric: She has a normal mood and affect. Her behavior is normal. Judgment and thought content normal.          Assessment & Plan:

## 2012-05-30 LAB — COMPREHENSIVE METABOLIC PANEL
ALT: 11 U/L (ref 0–35)
AST: 16 U/L (ref 0–37)
Albumin: 4.7 g/dL (ref 3.5–5.2)
Alkaline Phosphatase: 69 U/L (ref 39–117)
BUN: 8 mg/dL (ref 6–23)
CO2: 28 mEq/L (ref 19–32)
Calcium: 10.2 mg/dL (ref 8.4–10.5)
Chloride: 103 mEq/L (ref 96–112)
Creat: 0.6 mg/dL (ref 0.50–1.10)
Glucose, Bld: 68 mg/dL — ABNORMAL LOW (ref 70–99)
Potassium: 4.2 mEq/L (ref 3.5–5.3)
Sodium: 143 mEq/L (ref 135–145)
Total Bilirubin: 0.3 mg/dL (ref 0.3–1.2)
Total Protein: 7.8 g/dL (ref 6.0–8.3)

## 2012-05-30 LAB — MICROALBUMIN / CREATININE URINE RATIO
Creatinine, Urine: 40.4 mg/dL
Microalb Creat Ratio: 12.4 mg/g (ref 0.0–30.0)
Microalb, Ur: 0.5 mg/dL (ref 0.00–1.89)

## 2012-06-07 LAB — HM MAMMOGRAPHY

## 2012-06-08 LAB — HM MAMMOGRAPHY

## 2012-06-25 ENCOUNTER — Encounter: Payer: Self-pay | Admitting: Internal Medicine

## 2012-08-13 ENCOUNTER — Telehealth: Payer: Self-pay | Admitting: *Deleted

## 2012-08-13 NOTE — Telephone Encounter (Signed)
Patient is calling requesting the results of her MRI, have you received those yet?

## 2012-08-13 NOTE — Telephone Encounter (Signed)
I have not yet received these. Can we request them?

## 2012-09-02 ENCOUNTER — Ambulatory Visit: Payer: BC Managed Care – PPO | Admitting: Internal Medicine

## 2012-09-10 ENCOUNTER — Ambulatory Visit: Payer: BC Managed Care – PPO | Admitting: Internal Medicine

## 2012-09-12 ENCOUNTER — Ambulatory Visit (INDEPENDENT_AMBULATORY_CARE_PROVIDER_SITE_OTHER): Payer: BC Managed Care – PPO | Admitting: Internal Medicine

## 2012-09-12 ENCOUNTER — Encounter: Payer: Self-pay | Admitting: Internal Medicine

## 2012-09-12 VITALS — BP 128/80 | HR 67 | Temp 98.3°F | Wt 154.0 lb

## 2012-09-12 DIAGNOSIS — J00 Acute nasopharyngitis [common cold]: Secondary | ICD-10-CM | POA: Insufficient documentation

## 2012-09-12 DIAGNOSIS — I10 Essential (primary) hypertension: Secondary | ICD-10-CM

## 2012-09-12 DIAGNOSIS — J309 Allergic rhinitis, unspecified: Secondary | ICD-10-CM

## 2012-09-12 DIAGNOSIS — Z0000 Encounter for general adult medical examination without abnormal findings: Secondary | ICD-10-CM

## 2012-09-12 DIAGNOSIS — R0781 Pleurodynia: Secondary | ICD-10-CM

## 2012-09-12 DIAGNOSIS — R0789 Other chest pain: Secondary | ICD-10-CM | POA: Insufficient documentation

## 2012-09-12 DIAGNOSIS — R079 Chest pain, unspecified: Secondary | ICD-10-CM

## 2012-09-12 MED ORDER — AZELASTINE-FLUTICASONE 137-50 MCG/ACT NA SUSP: 1.0000 | Freq: Two times a day (BID) | NASAL | Status: DC

## 2012-09-12 MED ORDER — HYDROCODONE-ACETAMINOPHEN 5-325 MG PO TABS: 1.0000 | ORAL_TABLET | Freq: Four times a day (QID) | ORAL | Status: DC | PRN

## 2012-09-12 NOTE — Assessment & Plan Note (Signed)
Exam is concerning for rib fracture at approximately 10th lateral rib mid-axillary line.  Will get CXR for evaluation given persistence of symptoms. Will use Hydrocodone prn for severe pain. Follow up prn.

## 2012-09-12 NOTE — Progress Notes (Signed)
Subjective:    Patient ID: Allison Brennan, female    DOB: [birth year], 47 y.o.   MRN: [id number]  HPI 47 year old female with history of tobacco abuse, hypertension, meniscal tear presents for followup. Her primary concern today is left-sided rib pain. She reports that one week ago she fell while getting out of a car landing on the ground and a curb on her left lateral chest. Since that time, she has had pain over the site. She denies shortness of breath. She denies fever or chills. She denies cough. She does continue to smoke.  Regards to hypertension, she reports compliance with her medications. Denies any headache, palpitations.  She is also concerned about persistent clear nasal drainage and sneezing. This typically occurs during the winter months. She has been occasionally using Claritin or other antihistamines with no improvement.  Outpatient Encounter Prescriptions as of 09/12/2012  Medication Sig Dispense Refill  . albuterol (PROVENTIL HFA;VENTOLIN HFA) 108 (90 BASE) MCG/ACT inhaler Inhale 1 puff into the lungs 2 (two) times daily.      Marland Kitchen amLODipine (NORVASC) 5 MG tablet Take 5 mg by mouth daily before breakfast.      . Aspirin-Caffeine (BC FAST PAIN RELIEF PO) Take 1 packet by mouth every 8 (eight) hours as needed. Pain      . losartan-hydrochlorothiazide (HYZAAR) 100-12.5 MG per tablet Take 1 tablet by mouth daily before breakfast.      . Azelastine-Fluticasone 137-50 MCG/ACT SUSP Place 1 spray into the nose 2 (two) times daily.  23 g  0  . budesonide-formoterol (SYMBICORT) 160-4.5 MCG/ACT inhaler Inhale 1 puff into the lungs daily.      Marland Kitchen HYDROcodone-acetaminophen (NORCO/VICODIN) 5-325 MG per tablet Take 1 tablet by mouth every 6 (six) hours as needed for pain.  30 tablet  0  . [DISCONTINUED] azithromycin (ZITHROMAX Z-PAK) 250 MG tablet Take 2 pills day 1, then 1 pill daily days 2-5  6 each  0  . [DISCONTINUED] chlorpheniramine-HYDROcodone (TUSSIONEX) 10-8 MG/5ML LQCR Take 5 mLs by  mouth every 12 (twelve) hours as needed.  140 mL  0  . [DISCONTINUED] predniSONE (STERAPRED UNI-PAK) 10 MG tablet Take 60mg  day 1 then taper by 10mg  daily  21 tablet  0   No facility-administered encounter medications on file as of 09/12/2012.   BP 128/80  Pulse 67  Temp(Src) 98.3 F (36.8 C) (Oral)  Wt 154 lb (69.854 kg)  BMI 26.42 kg/m2  SpO2 96%  Review of Systems  Constitutional: Negative for fever, chills, appetite change, fatigue and unexpected weight change.  HENT: Negative for ear pain, congestion, sore throat, trouble swallowing, neck pain, voice change and sinus pressure.   Eyes: Negative for visual disturbance.  Respiratory: Negative for cough, shortness of breath, wheezing and stridor.   Cardiovascular: Positive for chest pain. Negative for palpitations and leg swelling.  Gastrointestinal: Negative for nausea, vomiting, abdominal pain, diarrhea, constipation, blood in stool, abdominal distention and anal bleeding.  Genitourinary: Negative for dysuria and flank pain.  Musculoskeletal: Negative for myalgias, arthralgias and gait problem.  Skin: Negative for color change and rash.  Neurological: Negative for dizziness and headaches.  Hematological: Negative for adenopathy. Does not bruise/bleed easily.  Psychiatric/Behavioral: Negative for suicidal ideas, sleep disturbance and dysphoric mood. The patient is not nervous/anxious.        Objective:   Physical Exam  Constitutional: She is oriented to person, place, and time. She appears well-developed and well-nourished. No distress.  HENT:  Head: Normocephalic and atraumatic.  Right Ear: External  ear normal.  Left Ear: External ear normal.  Nose: Nose normal.  Mouth/Throat: Oropharynx is clear and moist. No oropharyngeal exudate.  Eyes: Conjunctivae are normal. Pupils are equal, round, and reactive to light. Right eye exhibits no discharge. Left eye exhibits no discharge. No scleral icterus.  Neck: Normal range of motion.  Neck supple. No tracheal deviation present. No thyromegaly present.  Cardiovascular: Normal rate, regular rhythm, normal heart sounds and intact distal pulses.  Exam reveals no gallop and no friction rub.   No murmur heard. Pulmonary/Chest: Effort normal. No accessory muscle usage. Not tachypneic. No respiratory distress. She has no decreased breath sounds. She has no wheezes. She has rhonchi (few scattered). She has no rales. She exhibits tenderness and deformity.    Musculoskeletal: Normal range of motion. She exhibits no edema and no tenderness.  Lymphadenopathy:    She has no cervical adenopathy.  Neurological: She is alert and oriented to person, place, and time. No cranial nerve deficit. She exhibits normal muscle tone. Coordination normal.  Skin: Skin is warm and dry. No rash noted. She is not diaphoretic. No erythema. No pallor.  Psychiatric: She has a normal mood and affect. Her behavior is normal. Judgment and thought content normal.          Assessment & Plan:

## 2012-09-12 NOTE — Assessment & Plan Note (Signed)
Symptoms consistent with allergic rhinitis.  Will try adding Dymista to help with symptoms. Also encouraged smoking cessation.

## 2012-09-12 NOTE — Assessment & Plan Note (Signed)
BP Readings from Last 3 Encounters:  09/12/12 128/80  05/29/12 138/66  03/13/12 125/60   BP well controlled on current medications. Will check renal function and urine microalbumin with labs today. Follow up 1 month for physical exam.

## 2012-09-13 ENCOUNTER — Telehealth: Payer: Self-pay | Admitting: *Deleted

## 2012-09-13 LAB — MICROALBUMIN / CREATININE URINE RATIO
Creatinine,U: 273.5 mg/dL
Microalb Creat Ratio: 1.2 mg/g (ref 0.0–30.0)
Microalb, Ur: 3.4 mg/dL — ABNORMAL HIGH (ref 0.0–1.9)

## 2012-09-13 LAB — COMPREHENSIVE METABOLIC PANEL
ALT: 16 U/L (ref 0–35)
AST: 19 U/L (ref 0–37)
Albumin: 3.8 g/dL (ref 3.5–5.2)
Alkaline Phosphatase: 115 U/L (ref 39–117)
BUN: 13 mg/dL (ref 6–23)
CO2: 29 mEq/L (ref 19–32)
Calcium: 9.4 mg/dL (ref 8.4–10.5)
Chloride: 100 mEq/L (ref 96–112)
Creatinine, Ser: 0.8 mg/dL (ref 0.4–1.2)
GFR: 82.4 mL/min (ref >60.00)
Glucose, Bld: 78 mg/dL (ref 70–99)
Potassium: 2.6 mEq/L — CL (ref 3.5–5.1)
Sodium: 139 mEq/L (ref 135–145)
Total Bilirubin: 0.7 mg/dL (ref 0.3–1.2)
Total Protein: 7.6 g/dL (ref 6.0–8.3)

## 2012-09-13 LAB — LIPID PANEL
Cholesterol: 175 mg/dL (ref 0–200)
HDL: 39.4 mg/dL (ref >39.00)
Total CHOL/HDL Ratio: 4
Triglycerides: 257 mg/dL — ABNORMAL HIGH (ref 0.0–149.0)
VLDL: 51.4 mg/dL — ABNORMAL HIGH (ref 0.0–40.0)

## 2012-09-13 LAB — LDL CHOLESTEROL, DIRECT: Direct LDL: 89.8 mg/dL

## 2012-09-13 LAB — CBC WITH DIFFERENTIAL/PLATELET
Basophils Absolute: 0.1 10*3/uL (ref 0.0–0.1)
Basophils Relative: 0.4 % (ref 0.0–3.0)
Eosinophils Absolute: 0.3 10*3/uL (ref 0.0–0.7)
Eosinophils Relative: 2.1 % (ref 0.0–5.0)
HCT: 43 % (ref 36.0–46.0)
Hemoglobin: 14.8 g/dL (ref 12.0–15.0)
Lymphocytes Relative: 21.1 % (ref 12.0–46.0)
Lymphs Abs: 3 10*3/uL (ref 0.7–4.0)
MCHC: 34.4 g/dL (ref 30.0–36.0)
MCV: 93.1 fl (ref 78.0–100.0)
Monocytes Absolute: 0.7 10*3/uL (ref 0.1–1.0)
Monocytes Relative: 5.1 % (ref 3.0–12.0)
Neutro Abs: 10.2 10*3/uL — ABNORMAL HIGH (ref 1.4–7.7)
Neutrophils Relative %: 71.3 % (ref 43.0–77.0)
Platelets: 293 10*3/uL (ref 150.0–400.0)
RBC: 4.62 Mil/uL (ref 3.87–5.11)
RDW: 14 % (ref 11.5–14.6)
WBC: 14.3 10*3/uL — ABNORMAL HIGH (ref 4.5–10.5)

## 2012-09-13 MED ORDER — LOSARTAN POTASSIUM 100 MG PO TABS: 100.0000 mg | ORAL_TABLET | Freq: Every day | ORAL | Status: DC

## 2012-09-13 MED ORDER — POTASSIUM CHLORIDE CRYS ER 20 MEQ PO TBCR: 20.0000 meq | EXTENDED_RELEASE_TABLET | Freq: Every day | ORAL | Status: DC

## 2012-09-13 NOTE — Telephone Encounter (Signed)
Lets have her STOP Losartan-HCTZ and start plain Losartan 100mg  daily #90 with 3 refills. Likely the HCTZ is making potassium low.  Then, lets have her take KCL daily x 2 days, repeat BMP on Monday.

## 2012-09-13 NOTE — Telephone Encounter (Signed)
Lab called with results for patient's potassium 2.6.

## 2012-09-13 NOTE — Telephone Encounter (Signed)
Called spoke with patient and she verbally agreed understanding. Would like Rx sent to [facility] on [address]. Rx sent.

## 2012-09-17 ENCOUNTER — Other Ambulatory Visit: Payer: Self-pay | Admitting: *Deleted

## 2012-09-17 ENCOUNTER — Other Ambulatory Visit: Payer: BC Managed Care – PPO

## 2012-09-17 DIAGNOSIS — I10 Essential (primary) hypertension: Secondary | ICD-10-CM

## 2012-09-18 ENCOUNTER — Telehealth: Payer: Self-pay | Admitting: *Deleted

## 2012-09-18 ENCOUNTER — Other Ambulatory Visit (INDEPENDENT_AMBULATORY_CARE_PROVIDER_SITE_OTHER): Payer: BC Managed Care – PPO

## 2012-09-18 DIAGNOSIS — E876 Hypokalemia: Secondary | ICD-10-CM

## 2012-09-18 LAB — BASIC METABOLIC PANEL
BUN: 7 mg/dL (ref 6–23)
CO2: 30 mEq/L (ref 19–32)
Calcium: 8.8 mg/dL (ref 8.4–10.5)
Chloride: 102 mEq/L (ref 96–112)
Creatinine, Ser: 0.6 mg/dL (ref 0.4–1.2)
GFR: 113.18 mL/min (ref >60.00)
Glucose, Bld: 103 mg/dL — ABNORMAL HIGH (ref 70–99)
Potassium: 2.7 mEq/L — CL (ref 3.5–5.1)
Sodium: 141 mEq/L (ref 135–145)

## 2012-09-18 NOTE — Telephone Encounter (Signed)
The lab called with a critical reading for Potassium 2.7. Dr Dan Humphreys notified.

## 2012-09-23 ENCOUNTER — Other Ambulatory Visit: Payer: BC Managed Care – PPO

## 2012-09-24 ENCOUNTER — Other Ambulatory Visit: Payer: BC Managed Care – PPO

## 2012-10-07 ENCOUNTER — Encounter: Payer: Self-pay | Admitting: Internal Medicine

## 2012-10-07 ENCOUNTER — Other Ambulatory Visit (HOSPITAL_COMMUNITY)
Admission: RE | Admit: 2012-10-07 | Discharge: 2012-10-07 | Disposition: A | Discharge status: Home / Self Care | Payer: BC Managed Care – PPO | Source: Ambulatory Visit | Attending: Internal Medicine | Admitting: Internal Medicine

## 2012-10-07 ENCOUNTER — Ambulatory Visit (INDEPENDENT_AMBULATORY_CARE_PROVIDER_SITE_OTHER): Payer: BC Managed Care – PPO | Admitting: Internal Medicine

## 2012-10-07 VITALS — BP 144/84 | HR 79 | Temp 98.2°F | Ht 64.0 in | Wt 153.0 lb

## 2012-10-07 DIAGNOSIS — F17200 Nicotine dependence, unspecified, uncomplicated: Secondary | ICD-10-CM | POA: Insufficient documentation

## 2012-10-07 DIAGNOSIS — I10 Essential (primary) hypertension: Secondary | ICD-10-CM

## 2012-10-07 DIAGNOSIS — Z01419 Encounter for gynecological examination (general) (routine) without abnormal findings: Secondary | ICD-10-CM | POA: Insufficient documentation

## 2012-10-07 DIAGNOSIS — L293 Anogenital pruritus, unspecified: Secondary | ICD-10-CM

## 2012-10-07 DIAGNOSIS — N760 Acute vaginitis: Secondary | ICD-10-CM | POA: Insufficient documentation

## 2012-10-07 DIAGNOSIS — N898 Other specified noninflammatory disorders of vagina: Secondary | ICD-10-CM

## 2012-10-07 DIAGNOSIS — E876 Hypokalemia: Secondary | ICD-10-CM

## 2012-10-07 DIAGNOSIS — Z113 Encounter for screening for infections with a predominantly sexual mode of transmission: Secondary | ICD-10-CM | POA: Insufficient documentation

## 2012-10-07 DIAGNOSIS — Z0000 Encounter for general adult medical examination without abnormal findings: Secondary | ICD-10-CM | POA: Insufficient documentation

## 2012-10-07 DIAGNOSIS — Z1151 Encounter for screening for human papillomavirus (HPV): Secondary | ICD-10-CM | POA: Insufficient documentation

## 2012-10-07 LAB — BASIC METABOLIC PANEL
BUN: 11 mg/dL (ref 6–23)
CO2: 26 mEq/L (ref 19–32)
Calcium: 9.1 mg/dL (ref 8.4–10.5)
Chloride: 104 mEq/L (ref 96–112)
Creatinine, Ser: 0.7 mg/dL (ref 0.4–1.2)
GFR: 99.63 mL/min (ref >60.00)
Glucose, Bld: 78 mg/dL (ref 70–99)
Potassium: 3.3 mEq/L — ABNORMAL LOW (ref 3.5–5.1)
Sodium: 141 mEq/L (ref 135–145)

## 2012-10-07 LAB — CBC WITH DIFFERENTIAL/PLATELET
Basophils Absolute: 0.1 10*3/uL (ref 0.0–0.1)
Basophils Relative: 1 % (ref 0.0–3.0)
Eosinophils Absolute: 0.2 10*3/uL (ref 0.0–0.7)
Eosinophils Relative: 2.5 % (ref 0.0–5.0)
HCT: 43.6 % (ref 36.0–46.0)
Hemoglobin: 14.9 g/dL (ref 12.0–15.0)
Lymphocytes Relative: 26.5 % (ref 12.0–46.0)
Lymphs Abs: 2.4 10*3/uL (ref 0.7–4.0)
MCHC: 34.1 g/dL (ref 30.0–36.0)
MCV: 94.8 fl (ref 78.0–100.0)
Monocytes Absolute: 0.4 10*3/uL (ref 0.1–1.0)
Monocytes Relative: 4.8 % (ref 3.0–12.0)
Neutro Abs: 5.8 10*3/uL (ref 1.4–7.7)
Neutrophils Relative %: 65.2 % (ref 43.0–77.0)
Platelets: 290 10*3/uL (ref 150.0–400.0)
RBC: 4.6 Mil/uL (ref 3.87–5.11)
RDW: 14.6 % (ref 11.5–14.6)
WBC: 8.9 10*3/uL (ref 4.5–10.5)

## 2012-10-07 LAB — HM PAP SMEAR

## 2012-10-07 NOTE — Assessment & Plan Note (Signed)
Exam is normal today. No findings to suggest yeast infection. Will check GC/CHL, trich with sample today.

## 2012-10-07 NOTE — Assessment & Plan Note (Signed)
BP Readings from Last 3 Encounters:  10/07/12 144/84  09/12/12 128/80  05/29/12 138/66   BP generally well controlled on current medications. Will recheck BMP today given hypokalemia in the past while on HCTZ.

## 2012-10-07 NOTE — Assessment & Plan Note (Addendum)
Strongly encouraged smoking cessation. Discussed options including Chantix and Wellbutrin, as well as nicotine replacement. Patient is not interested in quitting at this time.

## 2012-10-07 NOTE — Assessment & Plan Note (Signed)
Likely secondary to use of HCTZ in the past. Will recheck K today with labs.

## 2012-10-07 NOTE — Assessment & Plan Note (Signed)
General medical exam including breast and pelvic exam normal today. Mammogram scheduled. Colonoscopy scheduled. Encouraged smoking cessation. Reviewed recent labs. Will recheck potassium with labs today. Encouraged continued efforts at healthy diet and regular physical activity.

## 2012-10-07 NOTE — Progress Notes (Signed)
Subjective:    Patient ID: Allison Brennan, female    DOB: 1965/08/23, 47 y.o.   MRN: [id number]  HPI 47 year old female with history of tobacco abuse, hypertension presents for annual exam. Her only concern today is some intermittent vaginal itching. She denies any discharge or vaginal pain. She denies any fever or chills. She denies abdominal or pelvic pain. She otherwise reports she is feeling well. She has no interest in trying to quit smoking. She has tried Chantix in the past without success. She is currently smoking about one pack per day.  Outpatient Encounter Prescriptions as of 10/07/2012  Medication Sig Dispense Refill  . albuterol (PROVENTIL HFA;VENTOLIN HFA) 108 (90 BASE) MCG/ACT inhaler Inhale 1 puff into the lungs 2 (two) times daily.      Marland Kitchen amLODipine (NORVASC) 5 MG tablet Take 5 mg by mouth daily before breakfast.      . Aspirin-Caffeine (BC FAST PAIN RELIEF PO) Take 1 packet by mouth every 8 (eight) hours as needed. Pain      . Azelastine-Fluticasone 137-50 MCG/ACT SUSP Place 1 spray into the nose 2 (two) times daily.  23 g  0  . budesonide-formoterol (SYMBICORT) 160-4.5 MCG/ACT inhaler Inhale 1 puff into the lungs daily.      Marland Kitchen losartan (COZAAR) 100 MG tablet Take 1 tablet (100 mg total) by mouth daily.  90 tablet  3  . HYDROcodone-acetaminophen (NORCO/VICODIN) 5-325 MG per tablet Take 1 tablet by mouth every 6 (six) hours as needed for pain.  30 tablet  0  . potassium chloride SA (K-DUR,KLOR-CON) 20 MEQ tablet Take 1 tablet (20 mEq total) by mouth daily. For 2 days only then return to clinic for repeat labs  15 tablet  0  . [DISCONTINUED] losartan-hydrochlorothiazide (HYZAAR) 100-12.5 MG per tablet Take 1 tablet by mouth daily before breakfast.       No facility-administered encounter medications on file as of 10/07/2012.      Review of Systems  Constitutional: Negative for fever, chills, appetite change, fatigue and unexpected weight change.  HENT: Negative for ear pain,  congestion, sore throat, trouble swallowing, neck pain, voice change and sinus pressure.   Eyes: Negative for visual disturbance.  Respiratory: Negative for cough, shortness of breath, wheezing and stridor.   Cardiovascular: Negative for chest pain, palpitations and leg swelling.  Gastrointestinal: Negative for nausea, vomiting, abdominal pain, diarrhea, constipation, blood in stool, abdominal distention and anal bleeding.  Genitourinary: Negative for dysuria and flank pain.  Musculoskeletal: Negative for myalgias, arthralgias and gait problem.  Skin: Negative for color change and rash.  Neurological: Negative for dizziness and headaches.  Hematological: Negative for adenopathy. Does not bruise/bleed easily.  Psychiatric/Behavioral: Negative for suicidal ideas, sleep disturbance and dysphoric mood. The patient is not nervous/anxious.        Objective:   Physical Exam  Constitutional: She is oriented to person, place, and time. She appears well-developed and well-nourished. No distress.  HENT:  Head: Normocephalic and atraumatic.  Right Ear: External ear normal.  Left Ear: External ear normal.  Nose: Nose normal.  Mouth/Throat: Oropharynx is clear and moist. No oropharyngeal exudate.  Eyes: Conjunctivae are normal. Pupils are equal, round, and reactive to light. Right eye exhibits no discharge. Left eye exhibits no discharge. No scleral icterus.  Neck: Normal range of motion. Neck supple. No tracheal deviation present. No thyromegaly present.  Cardiovascular: Normal rate, regular rhythm, normal heart sounds and intact distal pulses.  Exam reveals no gallop and no friction rub.  No murmur heard. Pulmonary/Chest: Effort normal. No accessory muscle usage. Not tachypneic. No respiratory distress. She has decreased breath sounds (prolonged expiration). She has no wheezes. She has no rhonchi. She has no rales. She exhibits no tenderness.  Abdominal: Soft. Bowel sounds are normal. She exhibits  no distension and no mass. There is no tenderness. There is no rebound and no guarding.  Genitourinary: Rectum normal, vagina normal and uterus normal. No breast swelling, tenderness, discharge or bleeding. Pelvic exam was performed with patient supine. There is no rash, tenderness or lesion on the right labia. There is no rash, tenderness or lesion on the left labia. Uterus is not enlarged and not tender. Cervix exhibits no motion tenderness, no discharge and no friability. Right adnexum displays no mass, no tenderness and no fullness. Left adnexum displays no mass, no tenderness and no fullness. No erythema or tenderness around the vagina. No vaginal discharge found.  Musculoskeletal: Normal range of motion. She exhibits no edema and no tenderness.  Lymphadenopathy:    She has no cervical adenopathy.  Neurological: She is alert and oriented to person, place, and time. No cranial nerve deficit. She exhibits normal muscle tone. Coordination normal.  Skin: Skin is warm and dry. No rash noted. She is not diaphoretic. No erythema. No pallor.  Psychiatric: She has a normal mood and affect. Her behavior is normal. Judgment and thought content normal.          Assessment & Plan:

## 2012-10-10 ENCOUNTER — Telehealth: Payer: Self-pay | Admitting: *Deleted

## 2012-10-10 NOTE — Telephone Encounter (Signed)
Called patient and informed her of lab results, she stated her symptoms has not resolved any. She is still having the issues.

## 2012-10-10 NOTE — Telephone Encounter (Signed)
Message copied by Theola Sequin on Thu Oct 10, 2012  1:22 PM ------      Message from: Ronna Polio A      Created: Wed Oct 09, 2012  1:36 PM       Vaginal culture was negative. Have symptoms improved? ------

## 2012-10-10 NOTE — Telephone Encounter (Signed)
All of the testing was normal. If symptoms are persistent (vaginal itching), then we should set her up with GYN for evaluation.

## 2012-10-11 ENCOUNTER — Encounter: Payer: Self-pay | Admitting: *Deleted

## 2012-10-15 ENCOUNTER — Telehealth: Payer: Self-pay | Admitting: Internal Medicine

## 2012-10-15 NOTE — Telephone Encounter (Signed)
Pt returned you call please call [phone]

## 2012-10-16 NOTE — Telephone Encounter (Signed)
Spoke with patient, please refer to previous encounter for further information

## 2012-10-16 NOTE — Telephone Encounter (Signed)
Patient informed and verbalized understanding. Per patient the itching has stopped a little and thinks it may be related to the bath wash she was using. Informed her if it does not completely resolve in a couple days then give [place] a call back so we may go ahead with the referral.

## 2012-10-28 ENCOUNTER — Telehealth: Payer: Self-pay | Admitting: Internal Medicine

## 2012-10-28 NOTE — Telephone Encounter (Signed)
She will need to be put in for acute visit tomorrow.

## 2012-10-28 NOTE — Telephone Encounter (Signed)
Patient Information:  Caller Name: Jocelynn  Phone: [phone]  Patient: Allison Brennan  Gender: Female  DOB: May 29, 1966  Age: 47 Years  PCP: Ronna Polio (Adults only)  Pregnant: No  Office Follow Up:  Does the office need to follow up with this patient?: No  Instructions For The Office: N/A  RN Note:  Patient states she has had diarrhea; vomiting stopped overnight.  Per diarrhea protocol, emergent symptoms denied; home care advised, with callback parameters given.  Patient wants work excuse since she called out and her boss wants a note from the doctor's office; advised no appts available in system for today.  Info to office.  krs/can  Symptoms  Reason For Call & Symptoms: vomiting and diarrhea  Reviewed Health History In EMR: Yes  Reviewed Medications In EMR: Yes  Reviewed Allergies In EMR: Yes  Reviewed Surgeries / Procedures: Yes  Date of Onset of Symptoms: 10/27/2012 OB / GYN:  LMP: Unknown  Guideline(s) Used:  Diarrhea  Disposition Per Guideline:   Home Care  Reason For Disposition Reached:   Mild diarrhea  Advice Given:  N/A  Patient Will Follow Care Advice:  YES

## 2012-10-28 NOTE — Telephone Encounter (Signed)
Appointment scheduled with Raquel tomorrow

## 2012-10-29 ENCOUNTER — Ambulatory Visit (INDEPENDENT_AMBULATORY_CARE_PROVIDER_SITE_OTHER): Payer: BC Managed Care – PPO | Admitting: Adult Health

## 2012-10-29 ENCOUNTER — Encounter: Payer: Self-pay | Admitting: Adult Health

## 2012-10-29 VITALS — BP 126/72 | HR 68 | Temp 97.7°F | Resp 14 | Wt 153.2 lb

## 2012-10-29 DIAGNOSIS — A084 Viral intestinal infection, unspecified: Secondary | ICD-10-CM

## 2012-10-29 DIAGNOSIS — A088 Other specified intestinal infections: Secondary | ICD-10-CM

## 2012-10-29 MED ORDER — PROMETHAZINE HCL 25 MG PO TABS: 25.0000 mg | ORAL_TABLET | Freq: Three times a day (TID) | ORAL | Status: DC | PRN

## 2012-10-29 MED ORDER — LOPERAMIDE HCL 2 MG PO TABS: 2.0000 mg | ORAL_TABLET | Freq: Four times a day (QID) | ORAL | Status: DC | PRN

## 2012-10-29 NOTE — Patient Instructions (Addendum)
  Take imodium for each loose stool - no more than 4 per day.  Take phenergan for nausea every 8 hours as needed. This will make you sleepy.  Try to drink plenty of liquids - ginger ale, water, gatorade  Eat a bland diet: dry toast, plain baked potato, grilled chicken, chicken soup or foods that are easily digested.  Return to clinic if your symptoms do not improve by Thursday or Friday.

## 2012-10-29 NOTE — Assessment & Plan Note (Signed)
Symptoms began after exposure to sick contacts at work. Start imodium after each loose stool not to exceed 4/day. Also start phenergan for n/v q8h prn. May return to work on Friday. Eat a bland diet. RTC if symptoms not improved by Friday.

## 2012-10-29 NOTE — Progress Notes (Signed)
  Subjective:    Patient ID: Allison Brennan, female    DOB: Oct 21, 1965, 47 y.o.   MRN: [id number]  HPI  Pt presents with diarrhea since Sunday.She does not recall eating any spoiled food or any correlation with symptoms after consuming food. There have been sick people at work. She reports 7 episodes of diarrhea on Sunday, approximately 7 yesterday and 1 episode of diarrhea today. She vomited once early this morning. She has not been able to eat much.   Current Outpatient Prescriptions on File Prior to Visit  Medication Sig Dispense Refill  . albuterol (PROVENTIL HFA;VENTOLIN HFA) 108 (90 BASE) MCG/ACT inhaler Inhale 1 puff into the lungs 2 (two) times daily.      Marland Kitchen amLODipine (NORVASC) 5 MG tablet Take 5 mg by mouth daily before breakfast.      . Aspirin-Caffeine (BC FAST PAIN RELIEF PO) Take 1 packet by mouth every 8 (eight) hours as needed. Pain      . Azelastine-Fluticasone 137-50 MCG/ACT SUSP Place 1 spray into the nose 2 (two) times daily.  23 g  0  . budesonide-formoterol (SYMBICORT) 160-4.5 MCG/ACT inhaler Inhale 1 puff into the lungs daily.      Marland Kitchen losartan (COZAAR) 100 MG tablet Take 1 tablet (100 mg total) by mouth daily.  90 tablet  3   No current facility-administered medications on file prior to visit.     Review of Systems  Constitutional: Positive for appetite change. Negative for fever and chills.  Gastrointestinal: Positive for nausea, vomiting and diarrhea.   BP 126/72  Pulse 68  Temp(Src) 97.7 F (36.5 C) (Oral)  Resp 14  Wt 153 lb 4 oz (69.514 kg)  BMI 26.29 kg/m2  SpO2 97%     Objective:   Physical Exam  Constitutional: She is oriented to person, place, and time. She appears well-developed and well-nourished. No distress.  Cardiovascular: Normal rate, regular rhythm and normal heart sounds.   No murmur heard. Pulmonary/Chest: Effort normal and breath sounds normal.  Abdominal: Soft.  Hyperactive bowel sounds  Neurological: She is alert and oriented to  person, place, and time.  Skin: Skin is warm and dry.  Psychiatric: She has a normal mood and affect. Her behavior is normal. Thought content normal.       Assessment & Plan:

## 2012-11-12 ENCOUNTER — Encounter: Payer: Self-pay | Admitting: Emergency Medicine

## 2013-01-06 ENCOUNTER — Other Ambulatory Visit: Payer: Self-pay | Admitting: *Deleted

## 2013-01-06 MED ORDER — ALBUTEROL SULFATE HFA 108 (90 BASE) MCG/ACT IN AERS: 1.0000 | INHALATION_SPRAY | Freq: Two times a day (BID) | RESPIRATORY_TRACT | Status: DC

## 2013-02-03 ENCOUNTER — Other Ambulatory Visit: Payer: Self-pay | Admitting: Internal Medicine

## 2013-02-03 NOTE — Telephone Encounter (Signed)
Eprescribed.

## 2013-05-01 ENCOUNTER — Other Ambulatory Visit: Payer: Self-pay | Admitting: Internal Medicine

## 2013-05-01 NOTE — Telephone Encounter (Signed)
Eprescribed.

## 2013-07-08 ENCOUNTER — Emergency Department: Payer: Self-pay | Admitting: Emergency Medicine

## 2013-07-08 LAB — URINALYSIS, COMPLETE
Bilirubin,UR: NEGATIVE
Blood: NEGATIVE
Glucose,UR: NEGATIVE mg/dL (ref 0–75)
Ketone: NEGATIVE
Leukocyte Esterase: NEGATIVE
Nitrite: NEGATIVE
Ph: 5 (ref 4.5–8.0)
Protein: NEGATIVE
RBC,UR: 1 /HPF (ref 0–5)
Specific Gravity: 1.018 (ref 1.003–1.030)
Squamous Epithelial: 1
WBC UR: NONE SEEN /HPF (ref 0–5)

## 2013-07-08 LAB — GC/CHLAMYDIA PROBE AMP

## 2013-07-08 LAB — WET PREP, GENITAL

## 2014-04-07 ENCOUNTER — Emergency Department: Payer: Self-pay | Admitting: Emergency Medicine

## 2014-04-07 LAB — URINALYSIS, COMPLETE
Bacteria: NONE SEEN
Bilirubin,UR: NEGATIVE
Blood: NEGATIVE
Glucose,UR: NEGATIVE mg/dL (ref 0–75)
Ketone: NEGATIVE
Leukocyte Esterase: NEGATIVE
Nitrite: NEGATIVE
Ph: 7 (ref 4.5–8.0)
Protein: NEGATIVE
RBC,UR: NONE SEEN /HPF (ref 0–5)
Specific Gravity: 1.002 (ref 1.003–1.030)
Squamous Epithelial: 2
WBC UR: NONE SEEN /HPF (ref 0–5)

## 2014-08-13 ENCOUNTER — Other Ambulatory Visit: Payer: Self-pay | Admitting: Internal Medicine

## 2014-09-17 ENCOUNTER — Emergency Department: Payer: Self-pay | Admitting: Emergency Medicine

## 2014-12-29 ENCOUNTER — Other Ambulatory Visit: Payer: Self-pay

## 2015-01-25 ENCOUNTER — Ambulatory Visit (INDEPENDENT_AMBULATORY_CARE_PROVIDER_SITE_OTHER): Payer: Worker's Compensation | Admitting: Internal Medicine

## 2015-01-25 ENCOUNTER — Encounter: Payer: Self-pay | Admitting: Internal Medicine

## 2015-01-25 VITALS — BP 116/65 | HR 76 | Temp 98.1°F | Ht 63.5 in | Wt 171.5 lb

## 2015-01-25 DIAGNOSIS — Z01818 Encounter for other preprocedural examination: Secondary | ICD-10-CM

## 2015-01-25 DIAGNOSIS — R7989 Other specified abnormal findings of blood chemistry: Secondary | ICD-10-CM

## 2015-01-25 DIAGNOSIS — Z720 Tobacco use: Secondary | ICD-10-CM

## 2015-01-25 DIAGNOSIS — F17200 Nicotine dependence, unspecified, uncomplicated: Secondary | ICD-10-CM

## 2015-01-25 DIAGNOSIS — Z0000 Encounter for general adult medical examination without abnormal findings: Secondary | ICD-10-CM

## 2015-01-25 LAB — CBC WITH DIFFERENTIAL/PLATELET
Basophils Absolute: 0.1 10*3/uL (ref 0.0–0.1)
Basophils Relative: 0.6 % (ref 0.0–3.0)
Eosinophils Absolute: 0.3 10*3/uL (ref 0.0–0.7)
Eosinophils Relative: 3 % (ref 0.0–5.0)
HCT: 41.3 % (ref 36.0–46.0)
Hemoglobin: 13.8 g/dL (ref 12.0–15.0)
Lymphocytes Relative: 29.8 % (ref 12.0–46.0)
Lymphs Abs: 2.7 10*3/uL (ref 0.7–4.0)
MCHC: 33.4 g/dL (ref 30.0–36.0)
MCV: 94.3 fl (ref 78.0–100.0)
Monocytes Absolute: 0.6 10*3/uL (ref 0.1–1.0)
Monocytes Relative: 7 % (ref 3.0–12.0)
Neutro Abs: 5.4 10*3/uL (ref 1.4–7.7)
Neutrophils Relative %: 59.6 % (ref 43.0–77.0)
Platelets: 234 10*3/uL (ref 150.0–400.0)
RBC: 4.38 Mil/uL (ref 3.87–5.11)
RDW: 13.9 % (ref 11.5–15.5)
WBC: 9.1 10*3/uL (ref 4.0–10.5)

## 2015-01-25 LAB — COMPREHENSIVE METABOLIC PANEL
ALT: 16 U/L (ref 0–35)
AST: 30 U/L (ref 0–37)
Albumin: 3.9 g/dL (ref 3.5–5.2)
Alkaline Phosphatase: 65 U/L (ref 39–117)
BUN: 10 mg/dL (ref 6–23)
CO2: 27 mEq/L (ref 19–32)
Calcium: 9.1 mg/dL (ref 8.4–10.5)
Chloride: 105 mEq/L (ref 96–112)
Creatinine, Ser: 0.66 mg/dL (ref 0.40–1.20)
GFR: 100.42 mL/min (ref >60.00)
Glucose, Bld: 105 mg/dL — ABNORMAL HIGH (ref 70–99)
Potassium: 3.8 mEq/L (ref 3.5–5.1)
Sodium: 141 mEq/L (ref 135–145)
Total Bilirubin: 0.2 mg/dL (ref 0.2–1.2)
Total Protein: 6.5 g/dL (ref 6.0–8.3)

## 2015-01-25 LAB — LIPID PANEL
Cholesterol: 162 mg/dL (ref 0–200)
HDL: 36.1 mg/dL — ABNORMAL LOW (ref >39.00)
Total CHOL/HDL Ratio: 4
Triglycerides: 462 mg/dL — ABNORMAL HIGH (ref 0.0–149.0)

## 2015-01-25 LAB — MICROALBUMIN / CREATININE URINE RATIO
Creatinine,U: 124.8 mg/dL
Microalb Creat Ratio: 0.8 mg/g (ref 0.0–30.0)
Microalb, Ur: 1 mg/dL (ref 0.0–1.9)

## 2015-01-25 LAB — VITAMIN D 25 HYDROXY (VIT D DEFICIENCY, FRACTURES): VITD: 13.26 ng/mL — ABNORMAL LOW (ref 30.00–100.00)

## 2015-01-25 LAB — LDL CHOLESTEROL, DIRECT: Direct LDL: 87 mg/dL

## 2015-01-25 LAB — TSH: TSH: 0.85 u[IU]/mL (ref 0.35–4.50)

## 2015-01-25 MED ORDER — ALBUTEROL SULFATE HFA 108 (90 BASE) MCG/ACT IN AERS: 1.0000 | INHALATION_SPRAY | Freq: Four times a day (QID) | RESPIRATORY_TRACT | Status: DC | PRN

## 2015-01-25 NOTE — Progress Notes (Signed)
Pre visit review using our clinic review tool, if applicable. No additional management support is needed unless otherwise documented below in the visit note. 

## 2015-01-25 NOTE — Assessment & Plan Note (Addendum)
Preop exam normal. Recommend proceed with surgery as scheduled as pt is low risk based on modified risk index. Labs today as ordered. EKG today showed  NSR with no acute process. Follow up in 6 months and prn.

## 2015-01-25 NOTE — Progress Notes (Addendum)
Subjective:    Patient ID: Allison Brennan, female    DOB: 1966-06-04, 49 y.o.   MRN: [id number]  HPI  48YO female presents for preop eval.  Scheduled for surgical repair of toe fracture at [facility].  No history of problems with anesthesia. No recent illnesses. No previous issues with heavy bleeding. Continues to smoke 1 pack per day.  Past medical, surgical, family and social history per today's encounter.  Review of Systems  Constitutional: Negative for fever, chills, appetite change, fatigue and unexpected weight change.  Eyes: Negative for visual disturbance.  Respiratory: Negative for cough, chest tightness, shortness of breath and wheezing.   Cardiovascular: Negative for chest pain and leg swelling.  Gastrointestinal: Negative for nausea, vomiting, abdominal pain, diarrhea and constipation.  Musculoskeletal: Negative for myalgias and arthralgias.  Skin: Negative for color change and rash.  Hematological: Negative for adenopathy. Does not bruise/bleed easily.  Psychiatric/Behavioral: Negative for sleep disturbance and dysphoric mood. The patient is not nervous/anxious.        Objective:    BP 116/65 mmHg  Pulse 76  Temp(Src) 98.1 F (36.7 C) (Oral)  Ht 5' 3.5" (1.613 m)  Wt 171 lb 8 oz (77.792 kg)  BMI 29.90 kg/m2  SpO2 95% Physical Exam  Constitutional: She is oriented to person, place, and time. She appears well-developed and well-nourished. No distress.  HENT:  Head: Normocephalic and atraumatic.  Right Ear: External ear normal.  Left Ear: External ear normal.  Nose: Nose normal.  Mouth/Throat: Oropharynx is clear and moist. No oropharyngeal exudate.  Eyes: Conjunctivae and EOM are normal. Pupils are equal, round, and reactive to light. Right eye exhibits no discharge. Left eye exhibits no discharge. No scleral icterus.  Neck: Normal range of motion. Neck supple. No tracheal deviation present. No thyromegaly present.  Cardiovascular: Normal rate, regular  rhythm, normal heart sounds and intact distal pulses.  Exam reveals no gallop and no friction rub.   No murmur heard. Pulmonary/Chest: Effort normal and breath sounds normal. No accessory muscle usage. No tachypnea. No respiratory distress. She has no decreased breath sounds (prolonged expiration). She has no wheezes. She has no rhonchi. She has no rales. She exhibits no tenderness.  Abdominal: Soft. Bowel sounds are normal. She exhibits no distension and no mass. There is no tenderness. There is no rebound and no guarding.  Musculoskeletal: Normal range of motion. She exhibits no edema or tenderness.  Lymphadenopathy:    She has no cervical adenopathy.  Neurological: She is alert and oriented to person, place, and time. No cranial nerve deficit. She exhibits normal muscle tone. Coordination normal.  Skin: Skin is warm and dry. No rash noted. She is not diaphoretic. No erythema. No pallor.  Psychiatric: She has a normal mood and affect. Her behavior is normal. Judgment and thought content normal.          Assessment & Plan:   Problem List Items Addressed This Visit      Unprioritized   Preop examination - Primary    Preop exam normal. Recommend proceed with surgery as scheduled as pt is low risk based on modified risk index. Labs today as ordered. EKG today showed  NSR with no acute process. Follow up in 6 months and prn.      Relevant Orders   CBC with Differential/Platelet (Completed)   Comprehensive metabolic panel (Completed)   Lipid panel (Completed)   Microalbumin / creatinine urine ratio (Completed)   Vit D  25 hydroxy (rtn osteoporosis monitoring) (Completed)  TSH (Completed)   EKG 12-Lead (Completed)   Routine general medical examination at a health care facility   Relevant Orders   MM Digital Screening   Tobacco use disorder    Encouraged smoking cessation.          Return in about 6 months (around 07/28/2015) for Recheck.

## 2015-01-25 NOTE — Assessment & Plan Note (Signed)
Encouraged smoking cessation 

## 2015-01-25 NOTE — Patient Instructions (Addendum)
Labs today.  Follow up in 6 months. 

## 2015-01-26 ENCOUNTER — Other Ambulatory Visit: Payer: Self-pay | Admitting: *Deleted

## 2015-01-26 MED ORDER — ATORVASTATIN CALCIUM 10 MG PO TABS: 10.0000 mg | ORAL_TABLET | Freq: Every day | ORAL | Status: DC

## 2015-02-08 ENCOUNTER — Ambulatory Visit
Admission: RE | Admit: 2015-02-08 | Discharge: 2015-02-08 | Disposition: A | Discharge status: Home / Self Care | Payer: 59 | Source: Ambulatory Visit | Attending: Internal Medicine | Admitting: Internal Medicine

## 2015-02-08 ENCOUNTER — Encounter
Admission: RE | Admit: 2015-02-08 | Discharge: 2015-02-08 | Disposition: A | Discharge status: Home / Self Care | Payer: 59 | Source: Ambulatory Visit | Attending: Podiatry | Admitting: Podiatry

## 2015-02-08 DIAGNOSIS — Z0000 Encounter for general adult medical examination without abnormal findings: Secondary | ICD-10-CM

## 2015-02-08 DIAGNOSIS — Z1231 Encounter for screening mammogram for malignant neoplasm of breast: Secondary | ICD-10-CM | POA: Diagnosis not present

## 2015-02-08 NOTE — Patient Instructions (Signed)
  Your procedure is scheduled on: Friday 02/12/2015 Report to Day Surgery. 2ND FLOOR MEDICAL MALL ENTRANCE To find out your arrival time please call [phone] between 1PM - 3PM on Thursday 02/11/2015.  Remember: Instructions that are not followed completely may result in serious medical risk, up to and including death, or upon the discretion of your surgeon and anesthesiologist your surgery may need to be rescheduled.    __X__ 1. Do not eat food or drink liquids after midnight. No gum chewing or hard candies.     __X__ 2. No Alcohol for 24 hours before or after surgery.   ____ 3. Bring all medications with you on the day of surgery if instructed.    __X__ 4. Notify your doctor if there is any change in your medical condition     (cold, fever, infections).     Do not wear jewelry, make-up, hairpins, clips or nail polish.  Do not wear lotions, powders, or perfumes. .  Do not shave 48 hours prior to surgery. Men may shave face and neck.  Do not bring valuables to the hospital.    [facility] is not responsible for any belongings or valuables.               Contacts, dentures or bridgework may not be worn into surgery.  Leave your suitcase in the car. After surgery it may be brought to your room.  For patients admitted to the hospital, discharge time is determined by your                treatment team.   Patients discharged the day of surgery will not be allowed to drive home.   Please read over the following fact sheets that you were given:   Surgical Site Infection Prevention   _X__ Take these medicines the morning of surgery with A SIP OF WATER:    1. LIPITOR  2.   3.   4.  5.  6.  ____ Fleet Enema (as directed)   _X___ Use CHG Soap as directed  __X__ Use inhalers on the day of surgery AND BRING TO HOSPITAL ON DAY OF SURGERY  ____ Stop metformin 2 days prior to surgery    ____ Take 1/2 of usual insulin dose the night before surgery and none on the morning of surgery.    ____ Stop Coumadin/Plavix/aspirin on   ____ Stop Anti-inflammatories on    ____ Stop supplements until after surgery.    ____ Bring C-Pap to the hospital.

## 2015-02-12 ENCOUNTER — Encounter
Admission: RE | Disposition: A | Discharge status: Home / Self Care | Payer: Self-pay | Source: Ambulatory Visit | Attending: Podiatry

## 2015-02-12 ENCOUNTER — Ambulatory Visit: Payer: Worker's Compensation | Admitting: Anesthesiology

## 2015-02-12 ENCOUNTER — Ambulatory Visit
Admission: RE | Admit: 2015-02-12 | Discharge: 2015-02-12 | Disposition: A | Discharge status: Home / Self Care | Payer: Worker's Compensation | Source: Ambulatory Visit | Attending: Podiatry | Admitting: Podiatry

## 2015-02-12 DIAGNOSIS — J449 Chronic obstructive pulmonary disease, unspecified: Secondary | ICD-10-CM | POA: Diagnosis not present

## 2015-02-12 DIAGNOSIS — I10 Essential (primary) hypertension: Secondary | ICD-10-CM | POA: Diagnosis not present

## 2015-02-12 DIAGNOSIS — F17200 Nicotine dependence, unspecified, uncomplicated: Secondary | ICD-10-CM | POA: Insufficient documentation

## 2015-02-12 DIAGNOSIS — Z79899 Other long term (current) drug therapy: Secondary | ICD-10-CM | POA: Diagnosis not present

## 2015-02-12 DIAGNOSIS — M899 Disorder of bone, unspecified: Secondary | ICD-10-CM | POA: Diagnosis present

## 2015-02-12 DIAGNOSIS — X58XXXA Exposure to other specified factors, initial encounter: Secondary | ICD-10-CM | POA: Insufficient documentation

## 2015-02-12 DIAGNOSIS — S92421A Displaced fracture of distal phalanx of right great toe, initial encounter for closed fracture: Secondary | ICD-10-CM | POA: Insufficient documentation

## 2015-02-12 HISTORY — PX: EXOSTECTECTOMY TOE: SHX6618

## 2015-02-12 SURGERY — EXCISION, EXOSTOSIS, TOE
Anesthesia: General | Site: Toe | Laterality: Right | Wound class: Clean

## 2015-02-12 MED ORDER — MIDAZOLAM HCL 2 MG/2ML IJ SOLN
INTRAMUSCULAR | Status: DC | PRN
  Administered 2015-02-12: 2 mg via INTRAVENOUS

## 2015-02-12 MED ORDER — NEOMYCIN-POLYMYXIN B GU 40-200000 IR SOLN
Status: DC | PRN
  Administered 2015-02-12: 2 mL

## 2015-02-12 MED ORDER — FAMOTIDINE 20 MG PO TABS: 20.0000 mg | ORAL_TABLET | Freq: Once | ORAL | Status: DC

## 2015-02-12 MED ORDER — HYDROCODONE-ACETAMINOPHEN 5-325 MG PO TABS: 1.0000 | ORAL_TABLET | ORAL | Status: DC | PRN

## 2015-02-12 MED ORDER — PROPOFOL 10 MG/ML IV BOLUS
INTRAVENOUS | Status: DC | PRN
  Administered 2015-02-12: 50 mg via INTRAVENOUS
  Administered 2015-02-12: 20 mg via INTRAVENOUS

## 2015-02-12 MED ORDER — PROPOFOL INFUSION 10 MG/ML OPTIME
INTRAVENOUS | Status: DC | PRN
  Administered 2015-02-12: 70 ug/kg/min via INTRAVENOUS

## 2015-02-12 MED ORDER — DIPHENHYDRAMINE HCL 50 MG/ML IJ SOLN
INTRAMUSCULAR | Status: DC | PRN
  Administered 2015-02-12: 12.5 mg via INTRAVENOUS

## 2015-02-12 MED ORDER — LACTATED RINGERS IV SOLN
INTRAVENOUS | Status: DC
  Administered 2015-02-12 (×2): via INTRAVENOUS

## 2015-02-12 MED ORDER — LIDOCAINE HCL (CARDIAC) 20 MG/ML IV SOLN
INTRAVENOUS | Status: DC | PRN
  Administered 2015-02-12: 60 mg via INTRAVENOUS

## 2015-02-12 MED ORDER — LIDOCAINE HCL (PF) 1 % IJ SOLN
INTRAMUSCULAR | Status: AC
  Filled 2015-02-12: qty 30

## 2015-02-12 MED ORDER — ALBUTEROL SULFATE HFA 108 (90 BASE) MCG/ACT IN AERS
INHALATION_SPRAY | RESPIRATORY_TRACT | Status: DC | PRN
  Administered 2015-02-12: 2 via RESPIRATORY_TRACT

## 2015-02-12 MED ORDER — IPRATROPIUM-ALBUTEROL 0.5-2.5 (3) MG/3ML IN SOLN
RESPIRATORY_TRACT | Status: AC
  Filled 2015-02-12: qty 3

## 2015-02-12 MED ORDER — NEOMYCIN-POLYMYXIN B GU 40-200000 IR SOLN
Status: AC
  Filled 2015-02-12: qty 2

## 2015-02-12 MED ORDER — BUPIVACAINE HCL 0.5 % IJ SOLN
INTRAMUSCULAR | Status: DC | PRN
  Administered 2015-02-12: 3 mL

## 2015-02-12 MED ORDER — FAMOTIDINE 20 MG PO TABS
ORAL_TABLET | ORAL | Status: AC
  Administered 2015-02-12: 20 mg
  Filled 2015-02-12: qty 1

## 2015-02-12 MED ORDER — ONDANSETRON HCL 4 MG/2ML IJ SOLN: 4.0000 mg | Freq: Once | INTRAMUSCULAR | Status: DC | PRN

## 2015-02-12 MED ORDER — FENTANYL CITRATE (PF) 100 MCG/2ML IJ SOLN: 25.0000 ug | INTRAMUSCULAR | Status: DC | PRN

## 2015-02-12 MED ORDER — BUPIVACAINE HCL (PF) 0.5 % IJ SOLN
INTRAMUSCULAR | Status: AC
  Filled 2015-02-12: qty 30

## 2015-02-12 SURGICAL SUPPLY — 43 items
BAG COUNTER SPONGE EZ (MISCELLANEOUS) ×3 IMPLANT
BANDAGE ELASTIC 4 CLIP NS LF (GAUZE/BANDAGES/DRESSINGS) ×3 IMPLANT
BANDAGE STRETCH 3X4.1 STRL (GAUZE/BANDAGES/DRESSINGS) ×6 IMPLANT
BLADE MED AGGRESSIVE (BLADE) IMPLANT
BLADE OSC/SAGITTAL MD 5.5X18 (BLADE) ×3 IMPLANT
BLADE SURG 15 STRL LF DISP TIS (BLADE) IMPLANT
BLADE SURG 15 STRL SS (BLADE)
BNDG ESMARK 4X12 TAN STRL LF (GAUZE/BANDAGES/DRESSINGS) ×3 IMPLANT
CANISTER SUCT 1200ML W/VALVE (MISCELLANEOUS) ×3 IMPLANT
CUFF TOURN 18 STER (MISCELLANEOUS) ×3 IMPLANT
CUFF TOURN DUAL PL 12 NO SLV (MISCELLANEOUS) ×3 IMPLANT
DRAPE FLUOR MINI C-ARM 54X84 (DRAPES) ×3 IMPLANT
DRSG TELFA 3X8 NADH (GAUZE/BANDAGES/DRESSINGS) ×3 IMPLANT
DURAPREP 26ML APPLICATOR (WOUND CARE) ×3 IMPLANT
GAUZE PETRO XEROFOAM 1X8 (MISCELLANEOUS) ×3 IMPLANT
GAUZE SPONGE 4X4 12PLY STRL (GAUZE/BANDAGES/DRESSINGS) ×3 IMPLANT
GLOVE BIO SURGEON STRL SZ7.5 (GLOVE) ×3 IMPLANT
GLOVE INDICATOR 8.0 STRL GRN (GLOVE) ×3 IMPLANT
GOWN STRL REUS W/ TWL LRG LVL3 (GOWN DISPOSABLE) ×4 IMPLANT
GOWN STRL REUS W/TWL LRG LVL3 (GOWN DISPOSABLE) ×2
LABEL OR SOLS (LABEL) ×3 IMPLANT
NDL SAFETY 25GX1.5 (NEEDLE) ×6 IMPLANT
NEEDLE FILTER BLUNT 18X 1/2SAF (NEEDLE) ×1
NEEDLE FILTER BLUNT 18X1 1/2 (NEEDLE) ×2 IMPLANT
NS IRRIG 500ML POUR BTL (IV SOLUTION) ×3 IMPLANT
PACK EXTREMITY ARMC (MISCELLANEOUS) ×3 IMPLANT
PAD CAST CTTN 4X4 STRL (SOFTGOODS) IMPLANT
PAD GROUND ADULT SPLIT (MISCELLANEOUS) ×3 IMPLANT
PADDING CAST COTTON 4X4 STRL (SOFTGOODS)
PENCIL ELECTRO HAND CTR (MISCELLANEOUS) ×3 IMPLANT
RASP SM TEAR CROSS CUT (RASP) IMPLANT
SOL PREP PVP 2OZ (MISCELLANEOUS) ×3
SOLUTION PREP PVP 2OZ (MISCELLANEOUS) ×2 IMPLANT
SPLINT FAST PLASTER 5X30 (CAST SUPPLIES) ×1
SPLINT PLASTER CAST FAST 5X30 (CAST SUPPLIES) ×2 IMPLANT
STOCKINETTE STRL 6IN 960660 (GAUZE/BANDAGES/DRESSINGS) ×3 IMPLANT
STRAP SAFETY BODY (MISCELLANEOUS) ×3 IMPLANT
STRIP CLOSURE SKIN 1/4X4 (GAUZE/BANDAGES/DRESSINGS) ×3 IMPLANT
SUT VIC AB 4-0 FS2 27 (SUTURE) ×6 IMPLANT
SWABSTK COMLB BENZOIN TINCTURE (MISCELLANEOUS) IMPLANT
SYRINGE 10CC LL (SYRINGE) ×3 IMPLANT
WIRE Z .045 C-WIRE SPADE TIP (WIRE) IMPLANT
WIRE Z .062 C-WIRE SPADE TIP (WIRE) IMPLANT

## 2015-02-12 NOTE — Anesthesia Postprocedure Evaluation (Signed)
  Anesthesia Post-op Note  Patient: Allison Brennan  Procedure(s) Performed: Procedure(s): EXOSTECTECTOMY TOE (Right)  Anesthesia type:General  Patient location: PACU  Post pain: Pain level controlled  Post assessment: Post-op Vital signs reviewed, Patient's Cardiovascular Status Stable, Respiratory Function Stable, Patent Airway and No signs of Nausea or vomiting  Post vital signs: Reviewed and stable  Last Vitals:  Filed Vitals:   02/12/15 1031  BP: 159/72  Pulse:   Temp:   Resp: 16    Level of consciousness: awake, alert  and patient cooperative  Complications: No apparent anesthesia complications

## 2015-02-12 NOTE — H&P (View-Only) (Signed)
Subjective:    Patient ID: Allison Brennan, female    DOB: 1966-06-04, 49 y.o.   MRN: [id number]  HPI  48YO female presents for preop eval.  Scheduled for surgical repair of toe fracture at [facility].  No history of problems with anesthesia. No recent illnesses. No previous issues with heavy bleeding. Continues to smoke 1 pack per day.  Past medical, surgical, family and social history per today's encounter.  Review of Systems  Constitutional: Negative for fever, chills, appetite change, fatigue and unexpected weight change.  Eyes: Negative for visual disturbance.  Respiratory: Negative for cough, chest tightness, shortness of breath and wheezing.   Cardiovascular: Negative for chest pain and leg swelling.  Gastrointestinal: Negative for nausea, vomiting, abdominal pain, diarrhea and constipation.  Musculoskeletal: Negative for myalgias and arthralgias.  Skin: Negative for color change and rash.  Hematological: Negative for adenopathy. Does not bruise/bleed easily.  Psychiatric/Behavioral: Negative for sleep disturbance and dysphoric mood. The patient is not nervous/anxious.        Objective:    BP 116/65 mmHg  Pulse 76  Temp(Src) 98.1 F (36.7 C) (Oral)  Ht 5' 3.5" (1.613 m)  Wt 171 lb 8 oz (77.792 kg)  BMI 29.90 kg/m2  SpO2 95% Physical Exam  Constitutional: She is oriented to person, place, and time. She appears well-developed and well-nourished. No distress.  HENT:  Head: Normocephalic and atraumatic.  Right Ear: External ear normal.  Left Ear: External ear normal.  Nose: Nose normal.  Mouth/Throat: Oropharynx is clear and moist. No oropharyngeal exudate.  Eyes: Conjunctivae and EOM are normal. Pupils are equal, round, and reactive to light. Right eye exhibits no discharge. Left eye exhibits no discharge. No scleral icterus.  Neck: Normal range of motion. Neck supple. No tracheal deviation present. No thyromegaly present.  Cardiovascular: Normal rate, regular  rhythm, normal heart sounds and intact distal pulses.  Exam reveals no gallop and no friction rub.   No murmur heard. Pulmonary/Chest: Effort normal and breath sounds normal. No accessory muscle usage. No tachypnea. No respiratory distress. She has no decreased breath sounds (prolonged expiration). She has no wheezes. She has no rhonchi. She has no rales. She exhibits no tenderness.  Abdominal: Soft. Bowel sounds are normal. She exhibits no distension and no mass. There is no tenderness. There is no rebound and no guarding.  Musculoskeletal: Normal range of motion. She exhibits no edema or tenderness.  Lymphadenopathy:    She has no cervical adenopathy.  Neurological: She is alert and oriented to person, place, and time. No cranial nerve deficit. She exhibits normal muscle tone. Coordination normal.  Skin: Skin is warm and dry. No rash noted. She is not diaphoretic. No erythema. No pallor.  Psychiatric: She has a normal mood and affect. Her behavior is normal. Judgment and thought content normal.          Assessment & Plan:   Problem List Items Addressed This Visit      Unprioritized   Preop examination - Primary    Preop exam normal. Recommend proceed with surgery as scheduled as pt is low risk based on modified risk index. Labs today as ordered. EKG today showed  NSR with no acute process. Follow up in 6 months and prn.      Relevant Orders   CBC with Differential/Platelet (Completed)   Comprehensive metabolic panel (Completed)   Lipid panel (Completed)   Microalbumin / creatinine urine ratio (Completed)   Vit D  25 hydroxy (rtn osteoporosis monitoring) (Completed)  TSH (Completed)   EKG 12-Lead (Completed)   Routine general medical examination at a health care facility   Relevant Orders   MM Digital Screening   Tobacco use disorder    Encouraged smoking cessation.          Return in about 6 months (around 07/28/2015) for Recheck.

## 2015-02-12 NOTE — Interval H&P Note (Signed)
History and Physical Interval Note:  02/12/2015 8:16 AM  Allison Brennan  has presented today for surgery, with the diagnosis of FRACTURED DISTAL PHALANX RT GREAT TOE  The various methods of treatment have been discussed with the patient and family. After consideration of risks, benefits and other options for treatment, the patient has consented to  Procedure(s): HALLUX VALGUS AUSTIN (Right) as a surgical intervention .  The patient's history has been reviewed, patient examined, no change in status, stable for surgery.  I have reviewed the patient's chart and labs.  Questions were answered to the patient's satisfaction.     Cregg Jutte W.

## 2015-02-12 NOTE — Anesthesia Preprocedure Evaluation (Signed)
Anesthesia Evaluation  Patient identified by MRN, date of birth, ID band Patient awake    Reviewed: Allergy & Precautions, NPO status , Patient's Chart, lab work & pertinent test results  Airway Mallampati: II  TM Distance: >3 FB Neck ROM: Full    Dental  (+) Missing, Chipped   Pulmonary COPD (last used inhaler this am) COPD inhaler, Current Smoker (1 ppd),          Cardiovascular hypertension (pt stopped meds),     Neuro/Psych    GI/Hepatic   Endo/Other    Renal/GU      Musculoskeletal   Abdominal   Peds  Hematology   Anesthesia Other Findings   Reproductive/Obstetrics                             Anesthesia Physical Anesthesia Plan  ASA: II  Anesthesia Plan: General   Post-op Pain Management:    Induction: Intravenous  Airway Management Planned: Nasal Cannula  Additional Equipment:   Intra-op Plan:   Post-operative Plan:   Informed Consent: I have reviewed the patients History and Physical, chart, labs and discussed the procedure including the risks, benefits and alternatives for the proposed anesthesia with the patient or authorized representative who has indicated his/her understanding and acceptance.     Plan Discussed with:   Anesthesia Plan Comments:         Anesthesia Quick Evaluation

## 2015-02-12 NOTE — Transfer of Care (Signed)
Immediate Anesthesia Transfer of Care Note  Patient: Allison Brennan  Procedure(s) Performed: Procedure(s): EXOSTECTECTOMY TOE (Right)  Patient Location: PACU  Anesthesia Type:General  Level of Consciousness: awake  Airway & Oxygen Therapy: Patient Spontanous Breathing  Post-op Assessment: Report given to RN  Post vital signs: stable  Last Vitals:  Filed Vitals:   02/12/15 0947  BP: 150/87  Pulse: 62  Temp: 36.8 C  Resp: 16    Complications: No apparent anesthesia complications

## 2015-02-12 NOTE — Op Note (Signed)
Date of operation: 02/12/2015.  Surgeon: Ricci Barker DPM.  Preoperative diagnosis: Fracture fragment with exostosis right hallux.  Postoperative diagnosis same.  Procedure: Exostectomy distal phalanx right hallux.  Anesthesia: Local Mac.  Hemostasis: Pneumatic tourniquet right ankle 250 mmHg.  Estimated blood loss: Minimal.  Pathology: None.  Complications: None apparent.  Operative indications: This is a 49 year old female with a history of fracture in her right great toe that happened several months ago. Fracture fragments remained elevated and painful at the tip of her great toe in decision was made for exostectomy with removal of the fracture fragment.  Operative procedure: The patient was taken to the operating room and placed on the table in the supine position. Lolling satisfactory sedation the right ray toe was anesthetized with 3 cc of 0.5% bupivacaine plain around the base of the great toe. A pneumatic tourniquet was applied at the level of the right ankle and the foot was prepped and draped in the usual sterile fashion. The foot was exsanguinated and the tourniquet inflated to 250 mmHg.   Attention was then directed to the distal aspect of the right hallux where an approximate 2 cm linear incision was made from lateral to medial. The incision was deepened via sharp and blunt dissection down to the level of the bone at the tip of the distal phalanx. The soft tissues were freed from the dorsal exostosis and fragments and then the exostosis was removed using a ronguer. The bone was rasped smooth. Intraoperative FluoroScan views revealed good reduction of the prominence at the tip of the distal phalanx. The wound was then flushed with copious amounts of sterile saline and closed using 40 Vicryls simple interrupted sutures for deep closure followed by skin closure using 4-0 nylon simple interrupted sutures. Xeroform and a sterile gauze bandage were applied. The tourniquet was released  and blood flow noted to return to the right foot and digits 2 through 5. The patient tolerated the procedure and anesthesia well and was transported to the PACU with vital signs stable and in good condition.

## 2015-02-12 NOTE — Discharge Instructions (Signed)
1. Elevate right foot and leg.  2. Keep bandage clean, dry, and do not remove.  3. Sponge bathe only right lower extremity.  4. Wear surgical shoe on the right foot whenever walking or standing.  5. Take one pain pill, Norco, every 4 hours as needed for painAMBULATORY SURGERY  DISCHARGE INSTRUCTIONS   1) The drugs that you were given will stay in your system until tomorrow so for the next 24 hours you should not:  A) Drive an automobile B) Make any legal decisions C) Drink any alcoholic beverage   2) You may resume regular meals tomorrow.  Today it is better to start with liquids and gradually work up to solid foods.  You may eat anything you prefer, but it is better to start with liquids, then soup and crackers, and gradually work up to solid foods.   3) Please notify your doctor immediately if you have any unusual bleeding, trouble breathing, redness and pain at the surgery site, drainage, fever, or pain not relieved by medication.    4) Additional Instructions:        Please contact your physician with any problems or Same Day Surgery at [phone], Monday through Friday 6 am to 4 pm, or [place] at [facility] number at [phone].

## 2015-06-14 ENCOUNTER — Emergency Department: Payer: No Typology Code available for payment source

## 2015-06-14 ENCOUNTER — Emergency Department
Admission: EM | Admit: 2015-06-14 | Discharge: 2015-06-14 | Disposition: A | Discharge status: Home / Self Care | Payer: No Typology Code available for payment source | Attending: Emergency Medicine | Admitting: Emergency Medicine

## 2015-06-14 DIAGNOSIS — S60052A Contusion of left little finger without damage to nail, initial encounter: Secondary | ICD-10-CM | POA: Insufficient documentation

## 2015-06-14 DIAGNOSIS — Y92009 Unspecified place in unspecified non-institutional (private) residence as the place of occurrence of the external cause: Secondary | ICD-10-CM | POA: Insufficient documentation

## 2015-06-14 DIAGNOSIS — F17200 Nicotine dependence, unspecified, uncomplicated: Secondary | ICD-10-CM | POA: Insufficient documentation

## 2015-06-14 DIAGNOSIS — Y998 Other external cause status: Secondary | ICD-10-CM | POA: Insufficient documentation

## 2015-06-14 DIAGNOSIS — S62609A Fracture of unspecified phalanx of unspecified finger, initial encounter for closed fracture: Secondary | ICD-10-CM

## 2015-06-14 DIAGNOSIS — S62617A Displaced fracture of proximal phalanx of left little finger, initial encounter for closed fracture: Secondary | ICD-10-CM | POA: Insufficient documentation

## 2015-06-14 DIAGNOSIS — Y9389 Activity, other specified: Secondary | ICD-10-CM | POA: Insufficient documentation

## 2015-06-14 DIAGNOSIS — I10 Essential (primary) hypertension: Secondary | ICD-10-CM | POA: Insufficient documentation

## 2015-06-14 DIAGNOSIS — Z79899 Other long term (current) drug therapy: Secondary | ICD-10-CM | POA: Insufficient documentation

## 2015-06-14 DIAGNOSIS — W231XXA Caught, crushed, jammed, or pinched between stationary objects, initial encounter: Secondary | ICD-10-CM | POA: Insufficient documentation

## 2015-06-14 MED ORDER — OXYCODONE-ACETAMINOPHEN 5-325 MG PO TABS: 1.0000 | ORAL_TABLET | ORAL | Status: DC | PRN

## 2015-06-14 NOTE — ED Notes (Signed)
States she shut her left hand in house door on sat  Left 5 th finger bruised and swollen

## 2015-06-14 NOTE — ED Notes (Signed)
AaoX3.  SKIN WARM AND DRY.  NAD 

## 2015-06-14 NOTE — ED Provider Notes (Signed)
[facility] Emergency Department Provider Note  ____________________________________________  Time seen: Approximately 5:09 PM  I have reviewed the triage vital signs and the nursing notes.   HISTORY  Chief Complaint Finger Injury    HPI Allison Brennan is a 49 y.o. female who presents for evaluation of pain to her fifth digit after shutting her house door on her hand.   Past Medical History  Diagnosis Date  . Back pain, lumbosacral   . Hypertension   . PPD positive, treated 1998  . Headache(784.0)   . COPD (chronic obstructive pulmonary disease)     no pulmonologist    Patient Active Problem List   Diagnosis Date Noted  . Preop examination 01/25/2015  . Routine general medical examination at a health care facility 10/07/2012  . Tobacco use disorder 10/07/2012  . Allergic rhinitis 09/12/2012  . Hypokalemia 02/15/2012  . Benign essential tremor 01/17/2012  . Lumbago 11/13/2011  . Screening for breast cancer 08/29/2011  . COPD (chronic obstructive pulmonary disease) (HCC) 08/29/2011    Past Surgical History  Procedure Laterality Date  . Partial hysterectomy  1999    pelvic pain  . Cesarean section      x2  . Knee arthroscopy  03/13/2012    Procedure: ARTHROSCOPY KNEE;  Surgeon: Loanne DrillingFrank V Aluisio, MD;  Location: [facility] ORS;  Service: Orthopedics;  Laterality: Right;  debridementt right medial meniscus/chrondroplasty  . Exostectectomy toe Right 02/12/2015    Procedure: EXOSTECTECTOMY TOE;  Surgeon: Linus Galasodd Cline, MD;  Location: [facility] ORS;  Service: Podiatry;  Laterality: Right;    Current Outpatient Rx  Name  Route  Sig  Dispense  Refill  . albuterol (PROVENTIL HFA) 108 (90 BASE) MCG/ACT inhaler   Inhalation   Inhale 1-2 puffs into the lungs every 6 (six) hours as needed for wheezing or shortness of breath.   2 each   6   . atorvastatin (LIPITOR) 10 MG tablet   Oral   Take 1 tablet (10 mg total) by mouth daily. Patient not taking: Reported on  02/12/2015   30 tablet   5   . cholecalciferol (VITAMIN D) 1000 UNITS tablet   Oral   Take 2,000 Units by mouth daily.         Marland Kitchen. oxyCODONE-acetaminophen (ROXICET) 5-325 MG tablet   Oral   Take 1-2 tablets by mouth every 4 (four) hours as needed for severe pain.   20 tablet   0     Allergies Lisinopril  Family History  Problem Relation Age of Onset  . Diverticulitis Mother   . Deep vein thrombosis Mother   . Cancer Maternal Aunt     brain  . Arthritis Maternal Grandmother     Social History Social History  Substance Use Topics  . Smoking status: Current Every Day Smoker -- 1.00 packs/day for 28 years  . Smokeless tobacco: Never Used     Comment: Decline Cessation information/SLS  . Alcohol Use: Yes     Comment: 1-2 drinks week    Review of Systems Constitutional: No fever/chills Eyes: No visual changes. ENT: No sore throat. Cardiovascular: Denies chest pain. Respiratory: Denies shortness of breath. Gastrointestinal: No abdominal pain.  No nausea, no vomiting.  No diarrhea.  No constipation. Genitourinary: Negative for dysuria. Musculoskeletal: Positive for left fifth digit pain Skin: Negative for rash. Neurological: Negative for headaches, focal weakness or numbness.  10-point ROS otherwise negative.  ____________________________________________   PHYSICAL EXAM:  VITAL SIGNS: ED Triage Vitals  Enc Vitals Group  BP 06/14/15 1616 166/94 mmHg     Pulse Rate 06/14/15 1616 71     Resp 06/14/15 1616 18     Temp 06/14/15 1616 98 F (36.7 C)     Temp Source 06/14/15 1616 Oral     SpO2 06/14/15 1616 96 %     Weight 06/14/15 1616 170 lb (77.111 kg)     Height 06/14/15 1616  (1.626 m)     Head Cir --      Peak Flow --      Pain Score --      Pain Loc --      Pain Edu? --      Excl. in GC? --     Constitutional: Alert and oriented. Well appearing and in no acute distress.   Cardiovascular: Normal rate, regular rhythm. Grossly normal heart  sounds.  Good peripheral circulation. Respiratory: Normal respiratory effort.  No retractions. Lungs CTAB. Gastrointestinal: Soft and nontender. No distention. No abdominal bruits. No CVA tenderness. Musculoskeletal: Dorsum and palmar aspect of left hand with some ecchymosis and bruising noted with edema of the left fifth digit. Distal neurovascularly neurovascularly intact. Neurologic:  Normal speech and language. No gross focal neurologic deficits are appreciated. No gait instability. Skin:  Skin is warm, dry and intact. No rash noted. Psychiatric: Mood and affect are normal. Speech and behavior are normal.  ____________________________________________   LABS (all labs ordered are listed, but only abnormal results are displayed)  Labs Reviewed - No data to display   RADIOLOGY  There is a transverse mildly displaced comminuted fracture at the base of the fifth proximal phalanx with mild apex radial-volar angulation. There is 2 mm of radial displacement of the distal fracture fragment.  There is no other fracture or dislocation. 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 There is soft tissue swelling over the dorsal aspect of the left hand.  IMPRESSION: Transverse mildly displaced comminuted fracture at the base of the fifth proximal phalanx with mild apex radial-volar angulation. ____________________________________________   PROCEDURES  Procedure(s) performed: None  Critical Care performed: No  ____________________________________________   INITIAL IMPRESSION / ASSESSMENT AND PLAN / ED COURSE  Pertinent labs & imaging results that were available during my care of the patient were reviewed by me and considered in my medical decision making (see chart for details).  Transverse mildly displaced comminuted fracture at the base of the fifth proximal phalanx. Rx given for Percocet 5/325. Finger placed in splint brace by tape to the other finger. Patient  refused ulnar gutter splint. Patient to follow-up with Dr. Rosita Kea tomorrow for orthopedic evaluation. ____________________________________________   FINAL CLINICAL IMPRESSION(S) / ED DIAGNOSES  Final diagnoses:  Finger fracture, left, closed, initial encounter      Evangeline Dakin, PA-C 06/14/15 1810  Evangeline Dakin, PA-C 06/14/15 1811  Darien Ramus, MD 06/14/15 2104

## 2015-06-14 NOTE — Discharge Instructions (Signed)
Cast or Splint Care °Casts and splints support injured limbs and keep bones from moving while they heal. It is important to care for your cast or splint at home.   °HOME CARE INSTRUCTIONS °· Keep the cast or splint uncovered during the drying period. It can take 24 to 48 hours to dry if it is made of plaster. A fiberglass cast will dry in less than 1 hour. °· Do not rest the cast on anything harder than a pillow for the first 24 hours. °· Do not put weight on your injured limb or apply pressure to the cast until your health care provider gives you permission. °· Keep the cast or splint dry. Wet casts or splints can lose their shape and may not support the limb as well. A wet cast that has lost its shape can also create harmful pressure on your skin when it dries. Also, wet skin can become infected. °· Cover the cast or splint with a plastic bag when bathing or when out in the rain or snow. If the cast is on the trunk of the body, take sponge baths until the cast is removed. °· If your cast does become wet, dry it with a towel or a blow dryer on the cool setting only. °· Keep your cast or splint clean. Soiled casts may be wiped with a moistened cloth. °· Do not place any hard or soft foreign objects under your cast or splint, such as cotton, toilet paper, lotion, or powder. °· Do not try to scratch the skin under the cast with any object. The object could get stuck inside the cast. Also, scratching could lead to an infection. If itching is a problem, use a blow dryer on a cool setting to relieve discomfort. °· Do not trim or cut your cast or remove padding from inside of it. °· Exercise all joints next to the injury that are not immobilized by the cast or splint. For example, if you have a long leg cast, exercise the hip joint and toes. If you have an arm cast or splint, exercise the shoulder, elbow, thumb, and fingers. °· Elevate your injured arm or leg on 1 or 2 pillows for the first 1 to 3 days to decrease  swelling and pain. It is best if you can comfortably elevate your cast so it is higher than your heart. °SEEK MEDICAL CARE IF:  °· Your cast or splint cracks. °· Your cast or splint is too tight or too loose. °· You have unbearable itching inside the cast. °· Your cast becomes wet or develops a soft spot or area. °· You have a bad smell coming from inside your cast. °· You get an object stuck under your cast. °· Your skin around the cast becomes red or raw. °· You have new pain or worsening pain after the cast has been applied. °SEEK IMMEDIATE MEDICAL CARE IF:  °· You have fluid leaking through the cast. °· You are unable to move your fingers or toes. °· You have discolored (blue or white), cool, painful, or very swollen fingers or toes beyond the cast. °· You have tingling or numbness around the injured area. °· You have severe pain or pressure under the cast. °· You have any difficulty with your breathing or have shortness of breath. °· You have chest pain. °  °This information is not intended to replace advice given to you by your health care provider. Make sure you discuss any questions you have with your health care   provider. °  °Document Released: 06/16/2000 Document Revised: 04/09/2013 Document Reviewed: 12/26/2012 °Elsevier Interactive Patient Education ©2016 [employer] ° °Finger Fracture °Fractures of fingers are breaks in the bones of the fingers. There are many types of fractures. There are different ways of treating these fractures. Your health care provider will discuss the best way to treat your fracture. °CAUSES °Traumatic injury is the main cause of broken fingers. These include: °· Injuries while playing sports. °· Workplace injuries. °· Falls. °RISK FACTORS °Activities that can increase your risk of finger fractures include: °· Sports. °· Workplace activities that involve machinery. °· A condition called osteoporosis, which can make your bones less dense and cause them to fracture more  easily. °SIGNS AND SYMPTOMS °The main symptoms of a broken finger are pain and swelling within 15 minutes after the injury. Other symptoms include: °· Bruising of your finger. °· Stiffness of your finger. °· Numbness of your finger. °· Exposed bones (compound fracture) if the fracture is severe. °DIAGNOSIS  °The best way to diagnose a broken bone is with X-ray imaging. Additionally, your health care provider will use this X-ray image to evaluate the position of the broken finger bones.  °TREATMENT  °Finger fractures can be treated with:  °· Nonreduction--This means the bones are in place. The finger is splinted without changing the positions of the bone pieces. The splint is usually left on for about a week to 10 days. This will depend on your fracture and what your health care provider thinks. °· Closed reduction--The bones are put back into position without using surgery. The finger is then splinted. °· Open reduction and internal fixation--The fracture site is opened. Then the bone pieces are fixed into place with pins or some type of hardware. This is seldom required. It depends on the severity of the fracture. °HOME CARE INSTRUCTIONS  °· Follow your health care provider's instructions regarding activities, exercises, and physical therapy. °· Only take over-the-counter or prescription medicines for pain, discomfort, or fever as directed by your health care provider. °SEEK MEDICAL CARE IF: °You have pain or swelling that limits the motion or use of your fingers. °SEEK IMMEDIATE MEDICAL CARE IF:  °Your finger becomes numb. °MAKE SURE YOU:  °· Understand these instructions. °· Will watch your condition. °· Will get help right away if you are not doing well or get worse. °  °This information is not intended to replace advice given to you by your health care provider. Make sure you discuss any questions you have with your health care provider. °  °Document Released: 10/01/2000 Document Revised: 04/09/2013 Document  Reviewed: 01/29/2013 °Elsevier Interactive Patient Education ©2016 Elsevier Inc[employer] ° °

## 2015-06-14 NOTE — ED Notes (Signed)
Pt presents to ER with c/o of fifth digit swelling due to door slamming on it. Pt has limited movement.

## 2015-06-17 ENCOUNTER — Ambulatory Visit
Admission: RE | Admit: 2015-06-17 | Discharge: 2015-06-17 | Disposition: A | Discharge status: Home / Self Care | Payer: No Typology Code available for payment source | Source: Ambulatory Visit | Attending: Orthopedic Surgery | Admitting: Orthopedic Surgery

## 2015-06-17 ENCOUNTER — Ambulatory Visit: Payer: No Typology Code available for payment source | Admitting: Anesthesiology

## 2015-06-17 ENCOUNTER — Encounter: Payer: Self-pay | Admitting: *Deleted

## 2015-06-17 ENCOUNTER — Encounter
Admission: RE | Disposition: A | Discharge status: Home / Self Care | Payer: Self-pay | Source: Ambulatory Visit | Attending: Orthopedic Surgery

## 2015-06-17 DIAGNOSIS — W230XXA Caught, crushed, jammed, or pinched between moving objects, initial encounter: Secondary | ICD-10-CM | POA: Insufficient documentation

## 2015-06-17 DIAGNOSIS — Z79899 Other long term (current) drug therapy: Secondary | ICD-10-CM | POA: Insufficient documentation

## 2015-06-17 DIAGNOSIS — S62637A Displaced fracture of distal phalanx of left little finger, initial encounter for closed fracture: Secondary | ICD-10-CM | POA: Insufficient documentation

## 2015-06-17 DIAGNOSIS — Z79891 Long term (current) use of opiate analgesic: Secondary | ICD-10-CM | POA: Diagnosis not present

## 2015-06-17 DIAGNOSIS — Z9889 Other specified postprocedural states: Secondary | ICD-10-CM | POA: Diagnosis not present

## 2015-06-17 DIAGNOSIS — Z888 Allergy status to other drugs, medicaments and biological substances status: Secondary | ICD-10-CM | POA: Diagnosis not present

## 2015-06-17 HISTORY — PX: CLOSED REDUCTION FINGER WITH PERCUTANEOUS PINNING: SHX5612

## 2015-06-17 SURGERY — CLOSED REDUCTION, FINGER, WITH PERCUTANEOUS PINNING
Anesthesia: General | Laterality: Left

## 2015-06-17 MED ORDER — OXYCODONE-ACETAMINOPHEN 5-325 MG PO TABS
1.0000 | ORAL_TABLET | ORAL | Status: DC | PRN
  Administered 2015-06-17: 1 via ORAL

## 2015-06-17 MED ORDER — FENTANYL CITRATE (PF) 100 MCG/2ML IJ SOLN
INTRAMUSCULAR | Status: AC
  Administered 2015-06-17: 25 ug via INTRAVENOUS
  Filled 2015-06-17: qty 2

## 2015-06-17 MED ORDER — METOCLOPRAMIDE HCL 10 MG PO TABS: 5.0000 mg | ORAL_TABLET | Freq: Three times a day (TID) | ORAL | Status: DC | PRN

## 2015-06-17 MED ORDER — ONDANSETRON HCL 4 MG PO TABS: 4.0000 mg | ORAL_TABLET | Freq: Four times a day (QID) | ORAL | Status: DC | PRN

## 2015-06-17 MED ORDER — SODIUM CHLORIDE 0.9 % IV SOLN: INTRAVENOUS | Status: DC

## 2015-06-17 MED ORDER — METOCLOPRAMIDE HCL 5 MG/ML IJ SOLN: 5.0000 mg | Freq: Three times a day (TID) | INTRAMUSCULAR | Status: DC | PRN

## 2015-06-17 MED ORDER — FENTANYL CITRATE (PF) 100 MCG/2ML IJ SOLN
INTRAMUSCULAR | Status: DC | PRN
  Administered 2015-06-17 (×2): 50 ug via INTRAVENOUS

## 2015-06-17 MED ORDER — BUPIVACAINE HCL (PF) 0.5 % IJ SOLN
INTRAMUSCULAR | Status: AC
Start: 2015-06-17 — End: 2015-06-17
  Filled 2015-06-17: qty 30

## 2015-06-17 MED ORDER — CEFAZOLIN SODIUM-DEXTROSE 2-3 GM-% IV SOLR
INTRAVENOUS | Status: AC
  Filled 2015-06-17: qty 50

## 2015-06-17 MED ORDER — FENTANYL CITRATE (PF) 100 MCG/2ML IJ SOLN
25.0000 ug | INTRAMUSCULAR | Status: AC | PRN
  Administered 2015-06-17 (×2): 25 ug via INTRAVENOUS

## 2015-06-17 MED ORDER — EPHEDRINE SULFATE 50 MG/ML IJ SOLN
INTRAMUSCULAR | Status: DC | PRN
  Administered 2015-06-17 (×2): 10 mg via INTRAVENOUS

## 2015-06-17 MED ORDER — FENTANYL CITRATE (PF) 100 MCG/2ML IJ SOLN
25.0000 ug | INTRAMUSCULAR | Status: AC | PRN
  Administered 2015-06-17 (×6): 25 ug via INTRAVENOUS

## 2015-06-17 MED ORDER — OXYCODONE-ACETAMINOPHEN 5-325 MG PO TABS: 1.0000 | ORAL_TABLET | ORAL | Status: DC | PRN

## 2015-06-17 MED ORDER — GLYCOPYRROLATE 0.2 MG/ML IJ SOLN
INTRAMUSCULAR | Status: DC | PRN
  Administered 2015-06-17: 0.2 mg via INTRAVENOUS

## 2015-06-17 MED ORDER — ONDANSETRON HCL 4 MG/2ML IJ SOLN: 4.0000 mg | Freq: Four times a day (QID) | INTRAMUSCULAR | Status: DC | PRN

## 2015-06-17 MED ORDER — LACTATED RINGERS IV SOLN
INTRAVENOUS | Status: DC
  Administered 2015-06-17: 15:00:00 via INTRAVENOUS

## 2015-06-17 MED ORDER — OXYCODONE-ACETAMINOPHEN 5-325 MG PO TABS
ORAL_TABLET | ORAL | Status: AC
  Filled 2015-06-17: qty 1

## 2015-06-17 MED ORDER — ONDANSETRON HCL 4 MG/2ML IJ SOLN: 4.0000 mg | Freq: Once | INTRAMUSCULAR | Status: DC | PRN

## 2015-06-17 MED ORDER — CEFAZOLIN SODIUM-DEXTROSE 2-3 GM-% IV SOLR
2.0000 g | Freq: Once | INTRAVENOUS | Status: AC
  Administered 2015-06-17: 2 g via INTRAVENOUS

## 2015-06-17 MED ORDER — ONDANSETRON HCL 4 MG/2ML IJ SOLN
INTRAMUSCULAR | Status: DC | PRN
Start: 2015-06-17 — End: 2015-06-17
  Administered 2015-06-17: 4 mg via INTRAVENOUS

## 2015-06-17 SURGICAL SUPPLY — 31 items
BANDAGE ELASTIC 4 LF NS (GAUZE/BANDAGES/DRESSINGS) ×2 IMPLANT
BNDG COHESIVE 1X5 TAN NS LF (GAUZE/BANDAGES/DRESSINGS) ×2 IMPLANT
BNDG COHESIVE 4X5 TAN STRL (GAUZE/BANDAGES/DRESSINGS) ×2 IMPLANT
BNDG ESMARK 4X12 TAN STRL LF (GAUZE/BANDAGES/DRESSINGS) ×2 IMPLANT
BNDG GAUZE 1X2.1 STRL (MISCELLANEOUS) ×2 IMPLANT
CHLORAPREP W/TINT 26ML (MISCELLANEOUS) ×2 IMPLANT
DRAPE FLUOR MINI C-ARM 54X84 (DRAPES) ×2 IMPLANT
ELECT CAUTERY BLADE 6.4 (BLADE) ×2 IMPLANT
GAUZE PETRO XEROFOAM 1X8 (MISCELLANEOUS) ×2 IMPLANT
GAUZE SPONGE 4X4 12PLY STRL (GAUZE/BANDAGES/DRESSINGS) ×2 IMPLANT
GAUZE SPONGE NON-WVN 2X2 STRL (MISCELLANEOUS) ×1 IMPLANT
GLOVE BIOGEL PI IND STRL 9 (GLOVE) ×1 IMPLANT
GLOVE BIOGEL PI INDICATOR 9 (GLOVE) ×1
GLOVE SURG ORTHO 9.0 STRL STRW (GLOVE) ×2 IMPLANT
GOWN SPECIALTY ULTRA XL (MISCELLANEOUS) ×2 IMPLANT
GOWN STRL REUS W/ TWL LRG LVL3 (GOWN DISPOSABLE) ×1 IMPLANT
GOWN STRL REUS W/TWL LRG LVL3 (GOWN DISPOSABLE) ×1
KIT RM TURNOVER STRD PROC AR (KITS) ×2 IMPLANT
NEEDLE FILTER BLUNT 18X 1/2SAF (NEEDLE) ×1
NEEDLE FILTER BLUNT 18X1 1/2 (NEEDLE) ×1 IMPLANT
NEEDLE HYPO 25X1 1.5 SAFETY (NEEDLE) ×2 IMPLANT
NS IRRIG 500ML POUR BTL (IV SOLUTION) ×2 IMPLANT
PACK EXTREMITY ARMC (MISCELLANEOUS) ×2 IMPLANT
PAD GROUND ADULT SPLIT (MISCELLANEOUS) ×2 IMPLANT
PAD PREP 24X41 OB/GYN DISP (PERSONAL CARE ITEMS) ×2 IMPLANT
PADDING CAST BLEND 4X4 NS (MISCELLANEOUS) ×2 IMPLANT
SPONGE VERSALON 2X2 STRL (MISCELLANEOUS) ×1
STOCKINETTE STRL 4IN 9604848 (GAUZE/BANDAGES/DRESSINGS) ×2 IMPLANT
SUT ETHIBOND 4-0 (SUTURE) ×2 IMPLANT
SUT ETHILON 5 0 CL P 3 (SUTURE) IMPLANT
SYRINGE 10CC LL (SYRINGE) ×2 IMPLANT

## 2015-06-17 NOTE — H&P (Signed)
Reviewed paper H+P, will be scanned into chart. No changes noted.  

## 2015-06-17 NOTE — Anesthesia Procedure Notes (Signed)
Procedure Name: LMA Insertion Date/Time: 06/17/2015 5:29 PM Performed by: Omer JackWEATHERLY, Oziel Beitler Pre-anesthesia Checklist: Patient identified, Patient being monitored, Timeout performed, Emergency Drugs available and Suction available Patient Re-evaluated:Patient Re-evaluated prior to inductionOxygen Delivery Method: Circle system utilized Preoxygenation: Pre-oxygenation with 100% oxygen Intubation Type: IV induction Ventilation: Mask ventilation without difficulty LMA: LMA inserted LMA Size: 3.0 Tube type: Oral Number of attempts: 1 Placement Confirmation: positive ETCO2 and breath sounds checked- equal and bilateral Tube secured with: Tape Dental Injury: Teeth and Oropharynx as per pre-operative assessment

## 2015-06-17 NOTE — Discharge Instructions (Addendum)
Keep arm elevated. Keep splint clean and dry. Okay to return to work if able to do job with splint in place

## 2015-06-17 NOTE — OR Nursing (Signed)
Left hand thumb and first two fingers warm with capillary refill wnl, pink moving fingers well
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
## 2015-06-17 NOTE — Anesthesia Preprocedure Evaluation (Signed)
Anesthesia Evaluation  Patient identified by MRN, date of birth, ID band Patient awake    Reviewed: Allergy & Precautions, NPO status , Patient's Chart, lab work & pertinent test results  History of Anesthesia Complications Negative for: history of anesthetic complications  Airway Mallampati: II       Dental  (+) Teeth Intact   Pulmonary neg pulmonary ROS, COPD, Current Smoker,           Cardiovascular hypertension (pt not taking meds),      Neuro/Psych negative neurological ROS     GI/Hepatic negative GI ROS, Neg liver ROS,   Endo/Other  negative endocrine ROS  Renal/GU negative Renal ROS     Musculoskeletal   Abdominal   Peds  Hematology negative hematology ROS (+)   Anesthesia Other Findings   Reproductive/Obstetrics                             Anesthesia Physical Anesthesia Plan  ASA: II  Anesthesia Plan: General   Post-op Pain Management:    Induction: Intravenous  Airway Management Planned: LMA  Additional Equipment:   Intra-op Plan:   Post-operative Plan:   Informed Consent: I have reviewed the patients History and Physical, chart, labs and discussed the procedure including the risks, benefits and alternatives for the proposed anesthesia with the patient or authorized representative who has indicated his/her understanding and acceptance.     Plan Discussed with:   Anesthesia Plan Comments:         Anesthesia Quick Evaluation

## 2015-06-17 NOTE — Transfer of Care (Signed)
Immediate Anesthesia Transfer of Care Note  Patient: Allison Brennan  Procedure(s) Performed: Procedure(s): CLOSED REDUCTION FINGER WITH PERCUTANEOUS PINNING little finger (Left)  Patient Location: PACU  Anesthesia Type:General  Level of Consciousness: patient cooperative and lethargic  Airway & Oxygen Therapy: Patient Spontanous Breathing and Patient connected to face mask oxygen  Post-op Assessment: Report given to RN and Post -op Vital signs reviewed and stable  Post vital signs: Reviewed and stable  Last Vitals:  Filed Vitals:   06/17/15 1454 06/17/15 1803  BP: 157/82 142/82  Pulse: 65 95  Temp: 36.8 C 36.6 C  Resp: 16 20    Complications: No apparent anesthesia complications

## 2015-06-17 NOTE — Op Note (Signed)
06/17/2015  6:06 PM  PATIENT:  Allison Brennan  49 y.o. female  PRE-OPERATIVE DIAGNOSIS:  FRACTURE 5TH DIGIT-LEFT proximal phalanx  POST-OPERATIVE DIAGNOSIS:  Same  PROCEDURE:  Procedure(s): CLOSED REDUCTION FINGER WITH PERCUTANEOUS PINNING little finger (Left)  SURGEON: Leitha SchullerMichael J Buna Cuppett, MD  ASSISTANTS: None  ANESTHESIA:   general  EBL:  Total I/O In: 400 [I.V.:400] Out: -   BLOOD ADMINISTERED:none  DRAINS: none   LOCAL MEDICATIONS USED:  MARCAINE     SPECIMEN:  No Specimen  DISPOSITION OF SPECIMEN:  N/A  COUNTS:  YES  TOURNIQUET:   none  IMPLANTS: 1.1 mm K wire  DICTATION: .Dragon Dictation patient was brought the operating room and after adequate general anesthesia was obtained, the left arm prepped draped in sterile fashion. After patient identification and timeout procedures were completed, the fracture was visualized under the mini C-arm. Reduction maneuver was performed by flexing the MCP and IP joints getting near anatomic alignment. A K wire was then inserted to the base of the proximal phalanx across the fracture site into the distal fragment maintaining near anatomic alignment. The pin was cut short and bent with Xeroform placed around the base of the 2 hopefully prevent migration and skin infection. 2 x 2's web roll and a posterior splint were then applied with the MCP joint flexed and IP joints straight  PLAN OF CARE: Discharge to home after PACU  PATIENT DISPOSITION:  PACU - hemodynamically stable.

## 2015-06-18 ENCOUNTER — Encounter: Payer: Self-pay | Admitting: Orthopedic Surgery

## 2015-06-18 NOTE — Anesthesia Postprocedure Evaluation (Signed)
Anesthesia Post Note  Patient: Allison Brennan  Procedure(s) Performed: Procedure(s) (LRB): CLOSED REDUCTION FINGER WITH PERCUTANEOUS PINNING little finger (Left)  Patient location during evaluation: PACU Anesthesia Type: General Level of consciousness: awake and alert Pain management: pain level controlled Vital Signs Assessment: post-procedure vital signs reviewed and stable Respiratory status: spontaneous breathing and respiratory function stable Cardiovascular status: blood pressure returned to baseline and stable Anesthetic complications: no    Last Vitals:  Filed Vitals:   06/17/15 1857 06/17/15 1924  BP: 158/85 144/74  Pulse: 73 74  Temp: 35.8 C   Resp: 15 15    Last Pain:  Filed Vitals:   06/18/15 0826  PainSc: 2                  Lorelle Macaluso K

## 2015-07-16 ENCOUNTER — Ambulatory Visit (INDEPENDENT_AMBULATORY_CARE_PROVIDER_SITE_OTHER): Payer: BLUE CROSS/BLUE SHIELD | Admitting: Nurse Practitioner

## 2015-07-16 ENCOUNTER — Encounter: Payer: Self-pay | Admitting: Nurse Practitioner

## 2015-07-16 VITALS — BP 136/84 | HR 71 | Temp 98.1°F | Resp 16 | Ht 64.0 in | Wt 169.0 lb

## 2015-07-16 DIAGNOSIS — J069 Acute upper respiratory infection, unspecified: Secondary | ICD-10-CM

## 2015-07-16 DIAGNOSIS — B9789 Other viral agents as the cause of diseases classified elsewhere: Principal | ICD-10-CM

## 2015-07-16 MED ORDER — HYDROCOD POLST-CPM POLST ER 10-8 MG/5ML PO SUER: 5.0000 mL | Freq: Every evening | ORAL | Status: DC | PRN

## 2015-07-16 MED ORDER — PREDNISONE 10 MG PO TABS: ORAL_TABLET | ORAL | Status: DC

## 2015-07-16 NOTE — Patient Instructions (Signed)
Prednisone with breakfast or lunch at the latest.  6 tablets on day 1, 5 tablets on day 2, 4 tablets on day 3, 3 tablets on day 4, 2 tablets day 5, 1 tablet on day 6...done! Take tablets all together not spaced out Don't take with NSAIDs (Ibuprofen, Aleve, Naproxen, Meloxicam ect...)  BC powder is a no go!   Upper Respiratory Infection, Adult Most upper respiratory infections (URIs) are a viral infection of the air passages leading to the lungs. A URI affects the nose, throat, and upper air passages. The most common type of URI is nasopharyngitis and is typically referred to as "the common cold." URIs run their course and usually go away on their own. Most of the time, a URI does not require medical attention, but sometimes a bacterial infection in the upper airways can follow a viral infection. This is called a secondary infection. Sinus and middle ear infections are common types of secondary upper respiratory infections. Bacterial pneumonia can also complicate a URI. A URI can worsen asthma and chronic obstructive pulmonary disease (COPD). Sometimes, these complications can require emergency medical care and may be life threatening.  CAUSES Almost all URIs are caused by viruses. A virus is a type of germ and can spread from one person to another.  RISKS FACTORS You may be at risk for a URI if:   You smoke.   You have chronic heart or lung disease.  You have a weakened defense (immune) system.   You are very young or very old.   You have nasal allergies or asthma.  You work in crowded or poorly ventilated areas.  You work in health care facilities or schools. SIGNS AND SYMPTOMS  Symptoms typically develop 2-3 days after you come in contact with a cold virus. Most viral URIs last 7-10 days. However, viral URIs from the influenza virus (flu virus) can last 14-18 days and are typically more severe. Symptoms may include:   Runny or stuffy (congested) nose.   Sneezing.   Cough.    Sore throat.   Headache.   Fatigue.   Fever.   Loss of appetite.   Pain in your forehead, behind your eyes, and over your cheekbones (sinus pain).  Muscle aches.  DIAGNOSIS  Your health care provider may diagnose a URI by:  Physical exam.  Tests to check that your symptoms are not due to another condition such as:  Strep throat.  Sinusitis.  Pneumonia.  Asthma. TREATMENT  A URI goes away on its own with time. It cannot be cured with medicines, but medicines may be prescribed or recommended to relieve symptoms. Medicines may help:  Reduce your fever.  Reduce your cough.  Relieve nasal congestion. HOME CARE INSTRUCTIONS   Take medicines only as directed by your health care provider.   Gargle warm saltwater or take cough drops to comfort your throat as directed by your health care provider.  Use a warm mist humidifier or inhale steam from a shower to increase air moisture. This may make it easier to breathe.  Drink enough fluid to keep your urine clear or pale yellow.   Eat soups and other clear broths and maintain good nutrition.   Rest as needed.   Return to work when your temperature has returned to normal or as your health care provider advises. You may need to stay home longer to avoid infecting others. You can also use a face mask and careful hand washing to prevent spread of the virus.  Increase the usage of your inhaler if you have asthma.   Do not use any tobacco products, including cigarettes, chewing tobacco, or electronic cigarettes. If you need help quitting, ask your health care provider. PREVENTION  The best way to protect yourself from getting a cold is to practice good hygiene.   Avoid oral or hand contact with people with cold symptoms.   Wash your hands often if contact occurs.  There is no clear evidence that vitamin C, vitamin E, echinacea, or exercise reduces the chance of developing a cold. However, it is always  recommended to get plenty of rest, exercise, and practice good nutrition.  SEEK MEDICAL CARE IF:   You are getting worse rather than better.   Your symptoms are not controlled by medicine.   You have chills.  You have worsening shortness of breath.  You have brown or red mucus.  You have yellow or brown nasal discharge.  You have pain in your face, especially when you bend forward.  You have a fever.  You have swollen neck glands.  You have pain while swallowing.  You have white areas in the back of your throat. SEEK IMMEDIATE MEDICAL CARE IF:   You have severe or persistent:  Headache.  Ear pain.  Sinus pain.  Chest pain.  You have chronic lung disease and any of the following:  Wheezing.  Prolonged cough.  Coughing up blood.  A change in your usual mucus.  You have a stiff neck.  You have changes in your:  Vision.  Hearing.  Thinking.  Mood. MAKE SURE YOU:   Understand these instructions.  Will watch your condition.  Will get help right away if you are not doing well or get worse.   This information is not intended to replace advice given to you by your health care provider. Make sure you discuss any questions you have with your health care provider.   Document Released: 12/13/2000 Document Revised: 11/03/2014 Document Reviewed: 09/24/2013 Elsevier Interactive Patient Education [employer].

## 2015-07-16 NOTE — Progress Notes (Signed)
Patient ID: Allison Brennan, female    DOB: 1965/12/01  Age: 50 y.o. MRN: [id number]  CC: Headache; Cough; and Sore Throat   HPI Allison Brennan presents for CC of HA, Cough, ST x 1 week.   1) Productive cough- white, rhinorrhea, HA Tmax- has not taken, subjective fever Treatment to date: Cough drops and BC powder  Sick contacts: Granddaughter 4 yo   History Allison Brennan has a past medical history of Back pain, lumbosacral; Hypertension; PPD positive, treated (1998); Headache(784.0); and COPD (chronic obstructive pulmonary disease) (HCC).   She has past surgical history that includes Partial hysterectomy (1999); Cesarean section; Knee arthroscopy (03/13/2012); Exostectectomy toe (Right, 02/12/2015); and Closed reduction finger with percutaneous pinning (Left, 06/17/2015).   Her family history includes Arthritis in her maternal grandmother; Cancer in her maternal aunt; Deep vein thrombosis in her mother; Diverticulitis in her mother.She reports that she has been smoking.  She has never used smokeless tobacco. She reports that she drinks alcohol. She reports that she does not use illicit drugs.  Outpatient Prescriptions Prior to Visit  Medication Sig Dispense Refill  . cholecalciferol (VITAMIN D) 1000 UNITS tablet Take 2,000 Units by mouth daily. Reported on 07/16/2015    . albuterol (PROVENTIL HFA) 108 (90 BASE) MCG/ACT inhaler Inhale 1-2 puffs into the lungs every 6 (six) hours as needed for wheezing or shortness of breath. (Patient not taking: Reported on 07/16/2015) 2 each 6  . atorvastatin (LIPITOR) 10 MG tablet Take 1 tablet (10 mg total) by mouth daily. (Patient not taking: Reported on 02/12/2015) 30 tablet 5  . oxyCODONE-acetaminophen (ROXICET) 5-325 MG tablet Take 1-2 tablets by mouth every 4 (four) hours as needed for severe pain. (Patient not taking: Reported on 07/16/2015) 20 tablet 0  . oxyCODONE-acetaminophen (ROXICET) 5-325 MG tablet Take 1 tablet by mouth every 4 (four) hours as needed  for severe pain. (Patient not taking: Reported on 07/16/2015) 30 tablet 0   No facility-administered medications prior to visit.    ROS Review of Systems  Constitutional: Positive for fever and diaphoresis. Negative for chills and fatigue.  HENT: Positive for congestion, postnasal drip, rhinorrhea, sinus pressure, sore throat and tinnitus. Negative for ear pain, trouble swallowing and voice change.        Tinnitus right ear  Eyes: Positive for discharge. Negative for pain, redness, itching and visual disturbance.       Watery  Respiratory: Positive for cough. Negative for chest tightness and wheezing.   Gastrointestinal: Negative for nausea, vomiting and diarrhea.  Skin: Negative for rash.  Neurological: Positive for headaches.    Objective:  BP 136/84 mmHg  Pulse 71  Temp(Src) 98.1 F (36.7 C) (Oral)  Resp 16  Ht 5\' 4"  (1.626 m)  Wt 169 lb (76.658 kg)  BMI 28.99 kg/m2  SpO2 97%  Physical Exam  Constitutional: She is oriented to person, place, and time. She appears well-developed and well-nourished. No distress.  HENT:  Head: Normocephalic and atraumatic.  Right Ear: External ear normal.  Left Ear: External ear normal.  Mouth/Throat: Oropharynx is clear and moist. No oropharyngeal exudate.  Tm's clear bilaterally  Eyes: Conjunctivae and EOM are normal. Pupils are equal, round, and reactive to light. Right eye exhibits no discharge. Left eye exhibits no discharge. No scleral icterus.  Neck: Normal range of motion. Neck supple.  Cardiovascular: Normal rate, regular rhythm and normal heart sounds.  Exam reveals no gallop and no friction rub.   No murmur heard. Pulmonary/Chest: Effort normal and breath sounds  normal. No respiratory distress. She has no wheezes. She has no rales. She exhibits no tenderness.  Lymphadenopathy:    She has no cervical adenopathy.  Neurological: She is alert and oriented to person, place, and time. No cranial nerve deficit. She exhibits normal muscle  tone. Coordination normal.  Skin: Skin is warm and dry. No rash noted. She is not diaphoretic.   Assessment & Plan:   Allison Landngela was seen today for headache, cough and sore throat.  Diagnoses and all orders for this visit:  Viral URI with cough  Other orders -     predniSONE (DELTASONE) 10 MG tablet; Take 6 tablets by mouth on day 1 with breakfast then decrease by 1 tablet each day until gone. -     chlorpheniramine-HYDROcodone (TUSSIONEX PENNKINETIC ER) 10-8 MG/5ML SUER; Take 5 mLs by mouth at bedtime as needed for cough.  I have discontinued Allison Brennan's albuterol, atorvastatin, oxyCODONE-acetaminophen, and oxyCODONE-acetaminophen. I am also having her start on predniSONE and chlorpheniramine-HYDROcodone. Additionally, I am having her maintain her cholecalciferol.  Meds ordered this encounter  Medications  . predniSONE (DELTASONE) 10 MG tablet    Sig: Take 6 tablets by mouth on day 1 with breakfast then decrease by 1 tablet each day until gone.    Dispense:  21 tablet    Refill:  0    Order Specific Question:  Supervising Provider    Answer:  Duncan DullULLO, TERESA L [[id number]]  . chlorpheniramine-HYDROcodone (TUSSIONEX PENNKINETIC ER) 10-8 MG/5ML SUER    Sig: Take 5 mLs by mouth at bedtime as needed for cough.    Dispense:  115 mL    Refill:  0    Order Specific Question:  Supervising Provider    Answer:  Sherlene ShamsULLO, TERESA L [[id number]]     Follow-up: Return if symptoms worsen or fail to improve.

## 2015-07-18 DIAGNOSIS — J069 Acute upper respiratory infection, unspecified: Secondary | ICD-10-CM | POA: Insufficient documentation

## 2015-07-18 DIAGNOSIS — B9789 Other viral agents as the cause of diseases classified elsewhere: Principal | ICD-10-CM

## 2015-07-18 NOTE — Assessment & Plan Note (Signed)
Advised pt to stop smoking New onset Prednisone taper and tussionex given to pt with instructions NO BC while on prednisone, no NSAIDs, and no driving or operating machinery while on tussionex FU prn worsening/failure to improve.

## 2015-07-29 ENCOUNTER — Ambulatory Visit: Payer: Self-pay | Admitting: Internal Medicine

## 2015-07-30 ENCOUNTER — Ambulatory Visit (INDEPENDENT_AMBULATORY_CARE_PROVIDER_SITE_OTHER): Payer: BLUE CROSS/BLUE SHIELD | Admitting: Internal Medicine

## 2015-07-30 ENCOUNTER — Ambulatory Visit
Admission: RE | Admit: 2015-07-30 | Discharge: 2015-07-30 | Disposition: A | Discharge status: Home / Self Care | Payer: BLUE CROSS/BLUE SHIELD | Source: Ambulatory Visit | Attending: Internal Medicine | Admitting: Internal Medicine

## 2015-07-30 ENCOUNTER — Encounter: Payer: Self-pay | Admitting: Internal Medicine

## 2015-07-30 VITALS — BP 143/83 | HR 82 | Temp 98.1°F | Ht 64.0 in | Wt 167.2 lb

## 2015-07-30 DIAGNOSIS — M79645 Pain in left finger(s): Secondary | ICD-10-CM | POA: Diagnosis not present

## 2015-07-30 DIAGNOSIS — Z1211 Encounter for screening for malignant neoplasm of colon: Secondary | ICD-10-CM | POA: Diagnosis not present

## 2015-07-30 DIAGNOSIS — J441 Chronic obstructive pulmonary disease with (acute) exacerbation: Secondary | ICD-10-CM

## 2015-07-30 DIAGNOSIS — T7411XA Adult physical abuse, confirmed, initial encounter: Secondary | ICD-10-CM | POA: Insufficient documentation

## 2015-07-30 DIAGNOSIS — M79644 Pain in right finger(s): Secondary | ICD-10-CM | POA: Insufficient documentation

## 2015-07-30 DIAGNOSIS — Z113 Encounter for screening for infections with a predominantly sexual mode of transmission: Secondary | ICD-10-CM

## 2015-07-30 MED ORDER — AMOXICILLIN-POT CLAVULANATE 875-125 MG PO TABS: 1.0000 | ORAL_TABLET | Freq: Two times a day (BID) | ORAL | Status: DC

## 2015-07-30 MED ORDER — PREDNISONE 10 MG PO TABS: ORAL_TABLET | ORAL | Status: DC

## 2015-07-30 MED ORDER — FLUOXETINE HCL 20 MG PO TABS: 20.0000 mg | ORAL_TABLET | Freq: Every day | ORAL | Status: DC

## 2015-07-30 MED ORDER — HYDROCOD POLST-CPM POLST ER 10-8 MG/5ML PO SUER: 5.0000 mL | Freq: Every evening | ORAL | Status: DC | PRN
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
## 2015-07-30 NOTE — Assessment & Plan Note (Signed)
Chronic bilateral thumb pain likely from overuse in her work. She cannot take time off from work. May have some improvement with prednisone taper starting today. Will also set up sports med evaluation. Question if any bracing available which might help functionally to allow her to work without exacerbating pain.

## 2015-07-30 NOTE — Assessment & Plan Note (Addendum)
Referral placed for colonoscopy. 

## 2015-07-30 NOTE — Progress Notes (Signed)
Pre visit review using our clinic review tool, if applicable. No additional management support is needed unless otherwise documented below in the visit note. 

## 2015-07-30 NOTE — Assessment & Plan Note (Signed)
Symptoms of persistent cough and exam c/w COPD exacerbation from bronchitis. Will get CXR. Prednisone taper and start Augmentin. Encouraged smoking cessation. Continue prn Albuterol. Recheck 4 weeks and prn.

## 2015-07-30 NOTE — Assessment & Plan Note (Signed)
STD testing sent today.
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
## 2015-07-30 NOTE — Patient Instructions (Addendum)
Start Prednisone taper and Augmentin to treat upper respiratory infection.  Chest xray today.  Continue cough syrup at night as needed.  We will set up evaluation with sports medicine and GI for colonoscopy.  We will set up counseling. Labs today.

## 2015-07-31 LAB — GC/CHLAMYDIA PROBE AMP
CT Probe RNA: NOT DETECTED
GC Probe RNA: NOT DETECTED

## 2015-07-31 LAB — HEPATITIS C ANTIBODY: HCV Ab: NEGATIVE

## 2015-07-31 LAB — RPR

## 2015-07-31 LAB — HEPATITIS B SURFACE ANTIBODY,QUALITATIVE: Hep B S Ab: NEGATIVE

## 2015-07-31 LAB — HEPATITIS B SURFACE ANTIGEN: Hepatitis B Surface Ag: NEGATIVE

## 2015-07-31 LAB — HIV ANTIBODY (ROUTINE TESTING W REFLEX): HIV 1&2 Ab, 4th Generation: NONREACTIVE

## 2015-08-16 ENCOUNTER — Encounter: Payer: Self-pay | Admitting: Family Medicine

## 2015-08-16 ENCOUNTER — Ambulatory Visit (INDEPENDENT_AMBULATORY_CARE_PROVIDER_SITE_OTHER): Payer: BLUE CROSS/BLUE SHIELD | Admitting: Family Medicine

## 2015-08-16 ENCOUNTER — Other Ambulatory Visit (INDEPENDENT_AMBULATORY_CARE_PROVIDER_SITE_OTHER): Payer: BLUE CROSS/BLUE SHIELD

## 2015-08-16 VITALS — BP 142/82 | HR 78 | Ht 64.0 in | Wt 170.0 lb

## 2015-08-16 DIAGNOSIS — M79644 Pain in right finger(s): Secondary | ICD-10-CM | POA: Diagnosis not present

## 2015-08-16 DIAGNOSIS — M189 Osteoarthritis of first carpometacarpal joint, unspecified: Secondary | ICD-10-CM | POA: Insufficient documentation

## 2015-08-16 DIAGNOSIS — M79645 Pain in left finger(s): Secondary | ICD-10-CM | POA: Diagnosis not present

## 2015-08-16 DIAGNOSIS — M180 Bilateral primary osteoarthritis of first carpometacarpal joints: Secondary | ICD-10-CM

## 2015-08-16 MED ORDER — DICLOFENAC SODIUM 2 % TD SOLN: TRANSDERMAL | Status: DC

## 2015-08-16 NOTE — Progress Notes (Signed)
Pre visit review using our clinic review tool, if applicable. No additional management support is needed unless otherwise documented below in the visit note. 

## 2015-08-16 NOTE — Patient Instructions (Signed)
Good to see you  Ice is your friend Ice 20 minutes 2 times daily. Usually after activity and before bed. pennsaid pinkie amount topically 2 times daily as needed.  Vitamin D 2000 IU daily Turmeric  daily  Tylenol  3 times daily can help with arthritis pain  See me again in 3-4 weeks to make sure you are doing well.

## 2015-08-16 NOTE — Assessment & Plan Note (Signed)
Patient on bilateral injections today. We discussed icing regimen and home exercises. We discussed which activities down which was potentially avoid. We discussed topical anti-inflammatories which was prescribed today. We discussed bracing which patient declined. We discussed formal physical therapy which patient declined as well. Patient will come back in 3 weeks to make sure that she is responding. Was completely pain-free after the injection.

## 2015-08-16 NOTE — Progress Notes (Signed)
Allison Brennan [address] [place], [place] [address] Phone: [phone] Subjective:    I'm seeing this patient by the request  of:  Wynona Dove, MD  CC: bilateral thumb pain  ION:[id number] Allison Brennan is a 50 y.o. female coming in with complaint of bilateral thumb pain. Patient does do a ladder repetitive motion at work. Describes the pain as a dull throbbing aching sensation. Seems to be worse. Has even stopped her from working somewhat. Has tried icing as well as anti-inflammatories with mild improvement. Denies any loss of function over all but does have some weakness  Compared to what she was able to do previously. Patient's primary care provider and was sent here for further evaluation. Rates the severity of pain a 6 out of 10. States that sometimes it feels like there is a grinding sensation. Difficult to open such things as a jar. No nighttime awakening.    Past Medical History  Diagnosis Date  . Back pain, lumbosacral   . Hypertension   . PPD positive, treated 1998  . Headache(784.0)   . COPD (chronic obstructive pulmonary disease) (HCC)     no pulmonologist   Past Surgical History  Procedure Laterality Date  . Partial hysterectomy  1999    pelvic pain  . Cesarean section      x2  . Knee arthroscopy  03/13/2012    Procedure: ARTHROSCOPY KNEE;  Surgeon: Loanne Drilling, MD;  Location: [facility];  Service: Orthopedics;  Laterality: Right;  debridementt right medial meniscus/chrondroplasty  . Exostectectomy toe Right 02/12/2015    Procedure: EXOSTECTECTOMY TOE;  Surgeon: Linus Galas, MD;  Location: [facility];  Service: Podiatry;  Laterality: Right;  . Closed reduction finger with percutaneous pinning Left 06/17/2015    Procedure: CLOSED REDUCTION FINGER WITH PERCUTANEOUS PINNING little finger;  Surgeon: Kennedy Bucker, MD;  Location: [facility];  Service: Orthopedics;  Laterality: Left;   Social History   Social History  . Marital Status:  Married    Spouse Name: N/A  . Number of Children: 2  . Years of Education: N/A   Occupational History  . [employer]    Social History Main Topics  . Smoking status: Current Every Day Smoker -- 1.00 packs/day for 28 years  . Smokeless tobacco: Never Used     Comment: Decline Cessation information/SLS  . Alcohol Use: Yes     Comment: 1-2 drinks week  . Drug Use: No  . Sexual Activity: Not Asked   Other Topics Concern  . None   Social History Narrative   Lives alone in [place], no pets. Children live nearby. Works in [employer] at [employer].      Regular Exercise -  NO   Daily Caffeine Use:  6 - 8 sodas   Allergies  Allergen Reactions  . Lisinopril Cough   Family History  Problem Relation Age of Onset  . Diverticulitis Mother   . Deep vein thrombosis Mother   . Cancer Maternal Aunt     brain  . Arthritis Maternal Grandmother     Past medical history, social, surgical and family history all reviewed in electronic medical record.  No pertanent information unless stated regarding to the chief complaint.   Review of Systems: No headache, visual changes, nausea, vomiting, diarrhea, constipation, dizziness, abdominal pain, skin rash, fevers, chills, night sweats, weight loss, swollen lymph nodes, body aches, joint swelling, muscle aches, chest pain, shortness of breath, mood changes.   Objective Blood pressure  142/82, pulse 78, height  (1.626 m), weight 170 lb (77.111 kg), SpO2 95 %.  General: No apparent distress alert and oriented x3 mood and affect normal, dressed appropriately.  HEENT: Pupils equal, extraocular movements intact  Respiratory: Patient's speak in full sentences and does not appear short of breath  Cardiovascular: No lower extremity edema, non tender, no erythema  Skin: Warm dry intact with no signs of infection or rash on extremities or on axial skeleton.  Abdomen: Soft nontender  Neuro: Cranial nerves II through XII are intact, neurovascularly intact  in all extremities with 2+ DTRs and 2+ pulses.  Lymph: No lymphadenopathy of posterior or anterior cervical chain or axillae bilaterally.  Gait normal with good balance and coordination.  MSK:  Non tender with full range of motion and good stability and symmetric strength and tone of shoulders, elbows, wrist, hip, knee and ankles bilaterally.  Hand exam shows the patient does have positive grind test bilaterally of the thumbs. Mild positive de Quervain's. Good grip strength. Neurologically intact. Good capillary refill.  Limited muscular skeletal ultrasound was performed and interpreted by Antoine Primas, M  Limited ultrasound of patient's thumb shows patient does have moderate degenerative changes with synovitis of the thumbs bilaterally. No loose bodies appreciated. Abductor pollicis longus tendon has no significant hypoechoic changes. No significant cortical defects of the bone noted.  Impression: Bilateral CMC arthritis  Procedure: Real-time Ultrasound Guided Injection of CMC joint right Device: GE Logiq E  Ultrasound guided injection is preferred based studies that show increased duration, increased effect, greater accuracy, decreased procedural pain, increased response rate, and decreased cost with ultrasound guided versus blind injection.  Verbal informed consent obtained.  Time-out conducted.  Noted no overlying erythema, induration, or other signs of local infection.  Skin prepped in a sterile fashion.  Local anesthesia: Topical Ethyl chloride.  With sterile technique and under real time ultrasound guidance:  With the 25-gauge 1 inch no patient was injected with a total of 0.5 mL of 0.5% Marcaine and 0.5 mL of Kenalog 40 mg/dL. Completed without difficulty  Pain immediately resolved suggesting accurate placement of the medication.  Advised to call if fevers/chills, erythema, induration, drainage, or persistent bleeding.  Images permanently stored and available for review in the  ultrasound unit.  Impression: Technically successful ultrasound guided injection.  Procedure: Real-time Ultrasound Guided Injection of CMC joint, left  Device: GE Logiq E  Ultrasound guided injection is preferred based studies that show increased duration, increased effect, greater accuracy, decreased procedural pain, increased response rate, and decreased cost with ultrasound guided versus blind injection.  Verbal informed consent obtained.  Time-out conducted.  Noted no overlying erythema, induration, or other signs of local infection.  Skin prepped in a sterile fashion.  Local anesthesia: Topical Ethyl chloride.  With sterile technique and under real time ultrasound guidance:  With the 25-gauge 1 inch no patient was injected with a total of 0.5 mL of 0.5% Marcaine and 0.5 mL of Kenalog 40 mg/dL in [facility] joint. Completed without difficulty  Pain immediately resolved suggesting accurate placement of the medication.  Advised to call if fevers/chills, erythema, induration, drainage, or persistent bleeding.  Images permanently stored and available for review in the ultrasound unit.  Impression: Technically successful ultrasound guided injection.     Impression and Recommendations:     This case required medical decision making of moderate complexity.      Note: This dictation was prepared with Dragon dictation along with smaller phrase technology. Any transcriptional errors  that result from this process are unintentional.

## 2015-08-30 ENCOUNTER — Encounter: Payer: Self-pay | Admitting: Internal Medicine

## 2015-08-30 ENCOUNTER — Ambulatory Visit (INDEPENDENT_AMBULATORY_CARE_PROVIDER_SITE_OTHER): Payer: BLUE CROSS/BLUE SHIELD | Admitting: Internal Medicine

## 2015-08-30 VITALS — BP 146/74 | HR 57 | Temp 98.0°F | Ht 64.0 in | Wt 171.1 lb

## 2015-08-30 DIAGNOSIS — Z23 Encounter for immunization: Secondary | ICD-10-CM

## 2015-08-30 DIAGNOSIS — J441 Chronic obstructive pulmonary disease with (acute) exacerbation: Secondary | ICD-10-CM | POA: Diagnosis not present

## 2015-08-30 DIAGNOSIS — F4323 Adjustment disorder with mixed anxiety and depressed mood: Secondary | ICD-10-CM | POA: Insufficient documentation

## 2015-08-30 DIAGNOSIS — M79645 Pain in left finger(s): Secondary | ICD-10-CM

## 2015-08-30 DIAGNOSIS — M79644 Pain in right finger(s): Secondary | ICD-10-CM

## 2015-08-30 MED ORDER — MELOXICAM 15 MG PO TABS: 15.0000 mg | ORAL_TABLET | Freq: Every day | ORAL | Status: DC

## 2015-08-30 NOTE — Progress Notes (Signed)
Pre visit review using our clinic review tool, if applicable. No additional management support is needed unless otherwise documented below in the visit note. 
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
## 2015-08-30 NOTE — Assessment & Plan Note (Signed)
Symptoms improved with Fluoxetine. Will continue. 

## 2015-08-30 NOTE — Assessment & Plan Note (Signed)
Symptoms of recent COPD exacerbation improved. Continue ProAir. Encourage smoking cessation.

## 2015-08-30 NOTE — Patient Instructions (Addendum)
Start Meloxicam  daily to help with joint pain.  Try wearing wrist braces at night for the next week.  We will start the Hep A and B vaccine series today.

## 2015-08-30 NOTE — Assessment & Plan Note (Signed)
Bilateral thumb pain secondary to [facility] arthritis. Minimal improvement with local steroid injection and topical Diclofenac. Will add Meloxicam daily. Discussed potential risks of this medication. Encouraged her to wear wrist brace and ice her wrists.  Follow up 4 weeks. If no improvement, consider referral to hand surgeon.

## 2015-08-31 NOTE — Addendum Note (Signed)
Addended by: Marchia Meiers on: 08/31/2015 08:42 AM   Modules accepted: Orders

## 2015-09-08 ENCOUNTER — Ambulatory Visit: Payer: Self-pay | Admitting: Family Medicine

## 2015-09-23 ENCOUNTER — Ambulatory Visit: Payer: Self-pay | Admitting: Psychology

## 2015-09-27 ENCOUNTER — Encounter: Payer: Self-pay | Admitting: Internal Medicine

## 2015-09-27 ENCOUNTER — Ambulatory Visit (INDEPENDENT_AMBULATORY_CARE_PROVIDER_SITE_OTHER): Payer: BLUE CROSS/BLUE SHIELD | Admitting: Internal Medicine

## 2015-09-27 VITALS — BP 149/88 | HR 71 | Temp 98.2°F | Ht 64.0 in | Wt 169.0 lb

## 2015-09-27 DIAGNOSIS — M180 Bilateral primary osteoarthritis of first carpometacarpal joints: Secondary | ICD-10-CM | POA: Diagnosis not present

## 2015-09-27 DIAGNOSIS — J449 Chronic obstructive pulmonary disease, unspecified: Secondary | ICD-10-CM | POA: Diagnosis not present

## 2015-09-27 DIAGNOSIS — Z23 Encounter for immunization: Secondary | ICD-10-CM

## 2015-09-27 DIAGNOSIS — K047 Periapical abscess without sinus: Secondary | ICD-10-CM | POA: Diagnosis not present

## 2015-09-27 MED ORDER — HYDROCODONE-ACETAMINOPHEN 5-325 MG PO TABS: 1.0000 | ORAL_TABLET | Freq: Four times a day (QID) | ORAL | Status: DC | PRN

## 2015-09-27 MED ORDER — AMOXICILLIN-POT CLAVULANATE 875-125 MG PO TABS: 1.0000 | ORAL_TABLET | Freq: Two times a day (BID) | ORAL | Status: DC

## 2015-09-27 MED ORDER — MELOXICAM 15 MG PO TABS: 15.0000 mg | ORAL_TABLET | Freq: Every day | ORAL | Status: DC

## 2015-09-27 NOTE — Progress Notes (Signed)
Pre visit review using our clinic review tool, if applicable. No additional management support is needed unless otherwise documented below in the visit note. 

## 2015-09-27 NOTE — Assessment & Plan Note (Signed)
Dental abscess noted. Encouraged her to follow up with dentist. Will start Augmentin and prn Hydrocodone for pain. Discussed risks of these medications. Follow up prn.

## 2015-09-27 NOTE — Assessment & Plan Note (Signed)
Continue Meloxicam. Encouraged physical therapy/occupational therapy.

## 2015-09-27 NOTE — Progress Notes (Signed)
Subjective:    Patient ID: Allison Brennan, female    DOB: 04-Jun-1966, 50 y.o.   MRN: [id number]  HPI  49YO female presents for follow up.  Last seen 2/27 for bilateral thumb pain. Started on Meloxicam. Minimal improvement really with this.  COPD - No recent change in cough or dyspnea. Uses Proair about once daily. No change in smoking.  Severe pain in right upper jaw at site of dental cavity. Taking multiple BC powders per day with no improvement.   Wt Readings from Last 3 Encounters:  09/27/15 169 lb (76.658 kg)  08/30/15 171 lb 2 oz (77.622 kg)  08/16/15 170 lb (77.111 kg)   BP Readings from Last 3 Encounters:  09/27/15 149/88  08/30/15 146/74  08/16/15 142/82    Past Medical History  Diagnosis Date  . Back pain, lumbosacral   . Hypertension   . PPD positive, treated 1998  . Headache(784.0)   . COPD (chronic obstructive pulmonary disease) (HCC)     no pulmonologist   Family History  Problem Relation Age of Onset  . Diverticulitis Mother   . Deep vein thrombosis Mother   . Cancer Maternal Aunt     brain  . Arthritis Maternal Grandmother    Past Surgical History  Procedure Laterality Date  . Partial hysterectomy  1999    pelvic pain  . Cesarean section      x2  . Knee arthroscopy  03/13/2012    Procedure: ARTHROSCOPY KNEE;  Surgeon: Loanne Drilling, MD;  Location: [facility];  Service: Orthopedics;  Laterality: Right;  debridementt right medial meniscus/chrondroplasty  . Exostectectomy toe Right 02/12/2015    Procedure: EXOSTECTECTOMY TOE;  Surgeon: Linus Galas, MD;  Location: [facility] ORS;  Service: Podiatry;  Laterality: Right;  . Closed reduction finger with percutaneous pinning Left 06/17/2015    Procedure: CLOSED REDUCTION FINGER WITH PERCUTANEOUS PINNING little finger;  Surgeon: Kennedy Bucker, MD;  Location: [facility] ORS;  Service: Orthopedics;  Laterality: Left;   Social History   Social History  . Marital Status: Married    Spouse Name: N/A  . Number of  Children: 2  . Years of Education: N/A   Occupational History  . [employer]    Social History Main Topics  . Smoking status: Current Every Day Smoker -- 1.00 packs/day for 28 years  . Smokeless tobacco: Never Used     Comment: Decline Cessation information/SLS  . Alcohol Use: Yes     Comment: 1-2 drinks week  . Drug Use: No  . Sexual Activity: Not Asked   Other Topics Concern  . None   Social History Narrative   Lives alone in [place], no pets. Children live nearby. Works in [employer] at [employer].      Regular Exercise -  NO   Daily Caffeine Use:  6 - 8 sodas    Review of Systems  Constitutional: Negative for fever, chills, appetite change, fatigue and unexpected weight change.  HENT: Positive for dental problem. Negative for congestion, postnasal drip, rhinorrhea, sinus pressure, sneezing, sore throat and trouble swallowing.   Eyes: Negative for visual disturbance.  Respiratory: Negative for cough and shortness of breath.   Cardiovascular: Negative for chest pain, palpitations and leg swelling.  Gastrointestinal: Negative for nausea, vomiting, abdominal pain, diarrhea and constipation.  Musculoskeletal: Positive for myalgias and arthralgias.  Skin: Negative for color change and rash.  Hematological: Negative for adenopathy. Does not bruise/bleed easily.  Psychiatric/Behavioral: Negative for sleep disturbance and dysphoric mood. The  patient is not nervous/anxious.        Objective:    BP 149/88 mmHg  Pulse 71  Temp(Src) 98.2 F (36.8 C) (Oral)  Ht 5\' 4"  (1.626 m)  Wt 169 lb (76.658 kg)  BMI 28.99 kg/m2  SpO2 97% Physical Exam  Constitutional: She is oriented to person, place, and time. She appears well-developed and well-nourished. No distress.  HENT:  Head: Normocephalic and atraumatic.  Right Ear: External ear normal.  Left Ear: External ear normal.  Nose: Nose normal.  Mouth/Throat: Oropharynx is clear and moist. Dental abscesses and dental caries present.  No oropharyngeal exudate.    Eyes: Conjunctivae are normal. Pupils are equal, round, and reactive to light. Right eye exhibits no discharge. Left eye exhibits no discharge. No scleral icterus.  Neck: Normal range of motion. Neck supple. No tracheal deviation present. No thyromegaly present.  Cardiovascular: Normal rate, regular rhythm, normal heart sounds and intact distal pulses.  Exam reveals no gallop and no friction rub.   No murmur heard. Pulmonary/Chest: Effort normal and breath sounds normal. No respiratory distress. She has no wheezes. She has no rales. She exhibits no tenderness.  Musculoskeletal: Normal range of motion. She exhibits no edema or tenderness.  Lymphadenopathy:    She has no cervical adenopathy.  Neurological: She is alert and oriented to person, place, and time. No cranial nerve deficit. She exhibits normal muscle tone. Coordination normal.  Skin: Skin is warm and dry. No rash noted. She is not diaphoretic. No erythema. No pallor.  Psychiatric: She has a normal mood and affect. Her behavior is normal. Judgment and thought content normal.          Assessment & Plan:   Problem List Items Addressed This Visit      Unprioritized   CMC arthritis, thumb, degenerative    Continue Meloxicam. Encouraged physical therapy/occupational therapy.      Relevant Medications   meloxicam (MOBIC) 15 MG tablet   HYDROcodone-acetaminophen (NORCO/VICODIN) 5-325 MG tablet   COPD (chronic obstructive pulmonary disease) (HCC) - Primary    Encouraged smoking cessation. Will continue Proair.      Relevant Orders   Comprehensive metabolic panel   Dental abscess    Dental abscess noted. Encouraged her to follow up with dentist. Will start Augmentin and prn Hydrocodone for pain. Discussed risks of these medications. Follow up prn.      Relevant Medications   amoxicillin-clavulanate (AUGMENTIN) 875-125 MG tablet       Return in about 6 months (around 03/29/2016) for  Physical.  Ronna PolioJennifer Kamica Florance, MD Internal Medicine [facility]

## 2015-09-27 NOTE — Patient Instructions (Addendum)
Labs today.  Let us know if you would like to see Occupational Therapy for your hand pain.  Hep A and B vaccine #2 today.  Start Augmentin for dental abscess. Use Hydrocodone as needed for severe pain.  Follow up in 6 months.

## 2015-09-27 NOTE — Addendum Note (Signed)
Addended by: Marchia MeiersEASTWOOD, Graysin Luczynski M on: 09/27/2015 04:06 PM   Modules accepted: Orders

## 2015-09-27 NOTE — Assessment & Plan Note (Signed)
Encouraged smoking cessation. Will continue Proair.

## 2015-09-28 LAB — COMPREHENSIVE METABOLIC PANEL
ALT: 13 U/L (ref 0–35)
AST: 15 U/L (ref 0–37)
Albumin: 4.1 g/dL (ref 3.5–5.2)
Alkaline Phosphatase: 80 U/L (ref 39–117)
BUN: 10 mg/dL (ref 6–23)
CO2: 29 mEq/L (ref 19–32)
Calcium: 10.1 mg/dL (ref 8.4–10.5)
Chloride: 100 mEq/L (ref 96–112)
Creatinine, Ser: 0.72 mg/dL (ref 0.40–1.20)
GFR: 90.59 mL/min (ref >60.00)
Glucose, Bld: 83 mg/dL (ref 70–99)
Potassium: 3.9 mEq/L (ref 3.5–5.1)
Sodium: 139 mEq/L (ref 135–145)
Total Bilirubin: 0.3 mg/dL (ref 0.2–1.2)
Total Protein: 7.6 g/dL (ref 6.0–8.3)

## 2016-02-01 ENCOUNTER — Encounter: Payer: Self-pay | Admitting: Emergency Medicine

## 2016-02-01 ENCOUNTER — Emergency Department: Payer: BLUE CROSS/BLUE SHIELD

## 2016-02-01 ENCOUNTER — Emergency Department
Admission: EM | Admit: 2016-02-01 | Discharge: 2016-02-01 | Disposition: A | Discharge status: Home / Self Care | Payer: BLUE CROSS/BLUE SHIELD | Attending: Student | Admitting: Student

## 2016-02-01 DIAGNOSIS — Z79899 Other long term (current) drug therapy: Secondary | ICD-10-CM | POA: Insufficient documentation

## 2016-02-01 DIAGNOSIS — Z791 Long term (current) use of non-steroidal anti-inflammatories (NSAID): Secondary | ICD-10-CM | POA: Insufficient documentation

## 2016-02-01 DIAGNOSIS — J449 Chronic obstructive pulmonary disease, unspecified: Secondary | ICD-10-CM | POA: Diagnosis not present

## 2016-02-01 DIAGNOSIS — I10 Essential (primary) hypertension: Secondary | ICD-10-CM | POA: Insufficient documentation

## 2016-02-01 DIAGNOSIS — M25562 Pain in left knee: Secondary | ICD-10-CM

## 2016-02-01 DIAGNOSIS — F17200 Nicotine dependence, unspecified, uncomplicated: Secondary | ICD-10-CM | POA: Insufficient documentation

## 2016-02-01 MED ORDER — NAPROXEN 500 MG PO TABS: 500.0000 mg | ORAL_TABLET | Freq: Two times a day (BID) | ORAL | 0 refills | Status: DC

## 2016-02-01 MED ORDER — CYCLOBENZAPRINE HCL 10 MG PO TABS: 10.0000 mg | ORAL_TABLET | Freq: Three times a day (TID) | ORAL | 0 refills | Status: DC | PRN

## 2016-02-01 NOTE — ED Triage Notes (Signed)
Reports pain in left knee after bowling several nights ago.  Ambulates to triage well.

## 2016-02-01 NOTE — ED Notes (Signed)
See triage note   Left knee pain for 2-3 days  Denies any specific injury but noticed pain after bowling  Ambulates with slight limp
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
## 2016-03-31 ENCOUNTER — Encounter: Payer: Self-pay | Admitting: Internal Medicine

## 2016-04-03 ENCOUNTER — Other Ambulatory Visit (HOSPITAL_COMMUNITY)
Admission: RE | Admit: 2016-04-03 | Discharge: 2016-04-03 | Disposition: A | Discharge status: Home / Self Care | Payer: BLUE CROSS/BLUE SHIELD | Source: Ambulatory Visit | Attending: Family | Admitting: Family

## 2016-04-03 ENCOUNTER — Encounter: Payer: Self-pay | Admitting: Family

## 2016-04-03 ENCOUNTER — Ambulatory Visit (INDEPENDENT_AMBULATORY_CARE_PROVIDER_SITE_OTHER): Payer: BLUE CROSS/BLUE SHIELD | Admitting: Family

## 2016-04-03 VITALS — BP 144/80 | HR 86 | Temp 97.9°F | Ht 64.0 in | Wt 179.6 lb

## 2016-04-03 DIAGNOSIS — J449 Chronic obstructive pulmonary disease, unspecified: Secondary | ICD-10-CM | POA: Diagnosis not present

## 2016-04-03 DIAGNOSIS — Z1151 Encounter for screening for human papillomavirus (HPV): Secondary | ICD-10-CM | POA: Diagnosis not present

## 2016-04-03 DIAGNOSIS — Z01419 Encounter for gynecological examination (general) (routine) without abnormal findings: Secondary | ICD-10-CM | POA: Insufficient documentation

## 2016-04-03 DIAGNOSIS — Z0000 Encounter for general adult medical examination without abnormal findings: Secondary | ICD-10-CM | POA: Diagnosis not present

## 2016-04-03 DIAGNOSIS — Z1231 Encounter for screening mammogram for malignant neoplasm of breast: Secondary | ICD-10-CM

## 2016-04-03 DIAGNOSIS — J441 Chronic obstructive pulmonary disease with (acute) exacerbation: Secondary | ICD-10-CM | POA: Diagnosis not present

## 2016-04-03 DIAGNOSIS — L739 Follicular disorder, unspecified: Secondary | ICD-10-CM

## 2016-04-03 LAB — HM PAP SMEAR: HM Pap smear: NEGATIVE

## 2016-04-03 MED ORDER — BUDESONIDE-FORMOTEROL FUMARATE 160-4.5 MCG/ACT IN AERO: 2.0000 | INHALATION_SPRAY | Freq: Two times a day (BID) | RESPIRATORY_TRACT | 3 refills | Status: DC

## 2016-04-03 MED ORDER — MUPIROCIN 2 % EX OINT: 1.0000 "application " | TOPICAL_OINTMENT | Freq: Two times a day (BID) | CUTANEOUS | 0 refills | Status: DC

## 2016-04-03 NOTE — Assessment & Plan Note (Signed)
Worsening. No adventitious lung sounds. Afebrile.Prescribed symbicort. Patient will f/u in 3 months or sooner if symptoms worsen. Encouraged smoking cessation.

## 2016-04-03 NOTE — Progress Notes (Signed)
Subjective:    Patient ID: Allison Brennan, female    DOB: 13-Oct-1965, 50 y.o.   MRN: [id number]  CC: Allison Brennan is a 50 y.o. female who presents today for physical exam.    HPI: Patient presents for CPE.   COPD- Current smoker. Starting to vape. Wheezing at night. Some productive cough at night. No SOB, fever, hemoptysis. Had been on singulair in the past.   She does note occasional bumps in hair new vagina. Not draining. No fever.     Colorectal Cancer Screening: Due Breast Cancer Screening: Mammogram Due, last 2016. Scheduled. Cervical Cancer Screening: Due; h/o partial hysterectomy; no h/o GYN cancer.  Bone Health screening/DEXA for 65+: No increased fracture risk. Defer screening at this time.  Immunizations       Tetanus - UTD        Pneumococcal - Done Hepatitis C screening - Negative HIV Screening- Negative Labs: Screening labs today. Exercise: None.  Alcohol use: Daily. Smoking/tobacco use: smoker.  Regular dental exams: In need of dental exam. Wears seat belt: Yes.  HISTORY:  Past Medical History:  Diagnosis Date  . Back pain, lumbosacral   . COPD (chronic obstructive pulmonary disease) (HCC)    no pulmonologist  . HYQMVHQI(696.2)   . Hypertension   . PPD positive, treated 1998    Past Surgical History:  Procedure Laterality Date  . CESAREAN SECTION     x2  . CLOSED REDUCTION FINGER WITH PERCUTANEOUS PINNING Left 06/17/2015   Procedure: CLOSED REDUCTION FINGER WITH PERCUTANEOUS PINNING little finger;  Surgeon: Kennedy Bucker, MD;  Location: [facility];  Service: Orthopedics;  Laterality: Left;  . EXOSTECTECTOMY TOE Right 02/12/2015   Procedure: EXOSTECTECTOMY TOE;  Surgeon: Linus Galas, MD;  Location: [facility];  Service: Podiatry;  Laterality: Right;  . KNEE ARTHROSCOPY  03/13/2012   Procedure: ARTHROSCOPY KNEE;  Surgeon: Loanne Drilling, MD;  Location: [facility];  Service: Orthopedics;  Laterality: Right;  debridementt right medial meniscus/chrondroplasty    . PARTIAL HYSTERECTOMY  1999   pelvic pain   Family History  Problem Relation Age of Onset  . Diverticulitis Mother   . Deep vein thrombosis Mother   . Arthritis Maternal Grandmother   . Osteoporosis Maternal Grandmother   . Cancer Maternal Aunt     brain      ALLERGIES: Lisinopril  Current Outpatient Prescriptions on File Prior to Visit  Medication Sig Dispense Refill  . PROAIR HFA 108 (90 Base) MCG/ACT inhaler   0   No current facility-administered medications on file prior to visit.     Social History  Substance Use Topics  . Smoking status: Current Every Day Smoker    Packs/day: 1.00    Years: 28.00  . Smokeless tobacco: Never Used     Comment: Decline Cessation information/SLS  . Alcohol use Yes     Comment: 1-2 drinks beer every day    Review of Systems  Constitutional: Negative for chills, fever and unexpected weight change.  HENT: Negative for congestion.   Respiratory: Positive for cough and wheezing. Negative for shortness of breath.   Cardiovascular: Negative for chest pain, palpitations and leg swelling.  Gastrointestinal: Negative for nausea and vomiting.  Musculoskeletal: Negative for arthralgias and myalgias.  Skin: Positive for wound. Negative for rash.  Neurological: Negative for headaches.  Hematological: Negative for adenopathy.  Psychiatric/Behavioral: Negative for confusion.      Objective:    BP (!) 144/80   Pulse 86   Temp 97.9 F (  36.6 C) (Oral)   Ht 5\' 4"  (1.626 m)   Wt 179 lb 9.6 oz (81.5 kg)   SpO2 96%   BMI 30.83 kg/m   BP Readings from Last 3 Encounters:  04/03/16 (!) 144/80  02/01/16 (!) 165/88  09/27/15 (!) 149/88   Wt Readings from Last 3 Encounters:  04/03/16 179 lb 9.6 oz (81.5 kg)  02/01/16 180 lb (81.6 kg)  09/27/15 169 lb (76.7 kg)    Physical Exam  Constitutional: She appears well-developed and well-nourished.  Eyes: Conjunctivae are normal.  Neck: No thyroid mass and no thyromegaly present.   Cardiovascular: Normal rate, regular rhythm, normal heart sounds and normal pulses.   Pulmonary/Chest: Effort normal and breath sounds normal. She has no wheezes. She has no rhonchi. She has no rales. Right breast exhibits no inverted nipple, no mass, no nipple discharge, no skin change and no tenderness. Left breast exhibits no inverted nipple, no mass, no nipple discharge, no skin change and no tenderness. Breasts are symmetrical.  No masses or asymmetry appreciated during CBE.  Abdominal:    Genitourinary: Uterus is not enlarged, not fixed and not tender. Cervix exhibits no motion tenderness, no discharge and no friability. Right adnexum displays no mass, no tenderness and no fullness. Left adnexum displays no mass, no tenderness and no fullness.  Genitourinary Comments: Pap performed. No CMT. Unable to appreciated ovaries.  Lymphadenopathy:       Head (right side): No submental, no submandibular, no tonsillar, no preauricular, no posterior auricular and no occipital adenopathy present.       Head (left side): No submental, no submandibular, no tonsillar, no preauricular, no posterior auricular and no occipital adenopathy present.       Right cervical: No superficial cervical, no deep cervical and no posterior cervical adenopathy present.      Left cervical: No superficial cervical, no deep cervical and no posterior cervical adenopathy present.    She has no axillary adenopathy.       Right axillary: No pectoral and no lateral adenopathy present.       Left axillary: No pectoral and no lateral adenopathy present. Neurological: She is alert.  Skin: Skin is warm and dry.  Psychiatric: She has a normal mood and affect. Her speech is normal and behavior is normal. Thought content normal.  Vitals reviewed.      Assessment & Plan:   Problem List Items Addressed This Visit      Respiratory   COPD (chronic obstructive pulmonary disease) (HCC)    Worsening. No adventitious lung sounds.  Afebrile.Prescribed symbicort. Patient will f/u in 3 months or sooner if symptoms worsen. Encouraged smoking cessation.       Relevant Medications   budesonide-formoterol (SYMBICORT) 160-4.5 MCG/ACT inhaler     Musculoskeletal and Integument   Folliculitis    New. Localized. 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  Advised warm compresses.        Relevant Medications   mupirocin ointment (BACTROBAN) 2 %     Other   Routine general medical examination at a health care facility    Referral placed for colonoscopy. Mammogram has been scheduled. Pap and CBE done today. Discussed stopping Pap after today as patient has had normal and a h/o of partial hysterectomy. She is UTD on immunizations. Counseled on use of alcohol. Congratulated patient on effort to cut back on smoking via vape, however encouraged all together cutting back. Declined any medication at this time.  Other Visit Diagnoses    Routine physical examination    -  Primary   Relevant Medications   budesonide-formoterol (SYMBICORT) 160-4.5 MCG/ACT inhaler   Other Relevant Orders   CBC with Differential/Platelet   Comprehensive metabolic panel   Lipid panel   TSH   VITAMIN D 25 Hydroxy (Vit-D Deficiency, Fractures)   Cytology - PAP   Encounter for screening mammogram for breast cancer       Relevant Orders   MM Digital Screening   COPD exacerbation (HCC)       Relevant Medications   budesonide-formoterol (SYMBICORT) 160-4.5 MCG/ACT inhaler       I have discontinued Ms. Carradine FLUoxetine, meloxicam, amoxicillin-clavulanate, HYDROcodone-acetaminophen, naproxen, and cyclobenzaprine. I am also having her start on mupirocin ointment. Additionally, I am having her maintain her PROAIR HFA and budesonide-formoterol.   Meds ordered this encounter  Medications  . budesonide-formoterol (SYMBICORT) 160-4.5 MCG/ACT inhaler    Sig: Inhale 2 puffs into the lungs 2 (two) times daily.      Dispense:  1 Inhaler    Refill:  3    Order Specific Question:   Supervising Provider    Answer:   TULLO, TERESA L [[id number]]  . mupirocin ointment (BACTROBAN) 2 %    Sig: Place 1 application into the nose 2 (two) times daily.    Dispense:  22 g    Refill:  0    Order Specific Question:   Supervising Provider    Answer:   Sherlene Shams [[id number]]    Return precautions given.   Risks, benefits, and alternatives of the medications and treatment plan prescribed today were discussed, and patient expressed understanding.   Education regarding symptom management and diagnosis given to patient on AVS.   Continue to follow with Rennie Plowman, FNP for routine health maintenance.   Allison Brennan and I agreed with plan.   Rennie Plowman, FNP

## 2016-04-03 NOTE — Assessment & Plan Note (Addendum)
Referral placed for colonoscopy. Mammogram has been scheduled. Pap and CBE done today. Discussed stopping Pap after today as patient has had normal and a h/o of partial hysterectomy. She is UTD on immunizations. Counseled on use of alcohol. Congratulated patient on effort to cut back on smoking via vape, however encouraged all together cutting back. Declined any medication at this time.

## 2016-04-03 NOTE — Assessment & Plan Note (Signed)
New. Localized. 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  Advised warm compresses.

## 2016-04-03 NOTE — Patient Instructions (Addendum)
Pleasure meeting you.   Please consider cutting back on smoking for your health and especially COPD  Health Maintenance, Female Adopting a healthy lifestyle and getting preventive care can go a long way to promote health and wellness. Talk with your health care provider about what schedule of regular examinations is right for you. This is a good chance for you to check in with your provider about disease prevention and staying healthy. In between checkups, there are plenty of things you can do on your own. Experts have done a lot of research about which lifestyle changes and preventive measures are most likely to keep you healthy. Ask your health care provider for more information. WEIGHT AND DIET  Eat a healthy diet  Be sure to include plenty of vegetables, fruits, low-fat dairy products, and lean protein.  Do not eat a lot of foods high in solid fats, added sugars, or salt.  Get regular exercise. This is one of the most important things you can do for your health.  Most adults should exercise for at least 150 minutes each week. The exercise should increase your heart rate and make you sweat (moderate-intensity exercise).  Most adults should also do strengthening exercises at least twice a week. This is in addition to the moderate-intensity exercise.  Maintain a healthy weight  Body mass index (BMI) is a measurement that can be used to identify possible weight problems. It estimates body fat based on height and weight. Your health care provider can help determine your BMI and help you achieve or maintain a healthy weight.  For females 21 years of age and older:   A BMI below 18.5 is considered underweight.  A BMI of 18.5 to 24.9 is normal.  A BMI of 25 to 29.9 is considered overweight.  A BMI of 30 and above is considered obese.  Watch levels of cholesterol and blood lipids  You should start having your blood tested for lipids and cholesterol at 52 years of age, then have this  test every 5 years.  You may need to have your cholesterol levels checked more often if:  Your lipid or cholesterol levels are high.  You are older than 52 years of age.  You are at high risk for heart disease.  CANCER SCREENING   Lung Cancer  Lung cancer screening is recommended for adults 25-45 years old who are at high risk for lung cancer because of a history of smoking.  A yearly low-dose CT scan of the lungs is recommended for people who:  Currently smoke.  Have quit within the past 15 years.  Have at least a 30-pack-year history of smoking. A pack year is smoking an average of one pack of cigarettes a day for 1 year.  Yearly screening should continue until it has been 15 years since you quit.  Yearly screening should stop if you develop a health problem that would prevent you from having lung cancer treatment.  Breast Cancer  Practice breast self-awareness. This means understanding how your breasts normally appear and feel.  It also means doing regular breast self-exams. Let your health care provider know about any changes, no matter how small.  If you are in your 20s or 30s, you should have a clinical breast exam (CBE) by a health care provider every 1-3 years as part of a regular health exam.  If you are 75 or older, have a CBE every year. Also consider having a breast X-ray (mammogram) every year.  If you have  If you have a family history of breast cancer, talk to your health care provider about genetic screening.  If you are at high risk for breast cancer, talk to your health care provider about having an MRI and a mammogram every year.  Breast cancer gene (BRCA) assessment is recommended for women who have family members with BRCA-related cancers. BRCA-related cancers include:  Breast.  Ovarian.  Tubal.  Peritoneal cancers.  Results of the assessment will determine the need for genetic counseling and BRCA1 and BRCA2 testing. Cervical Cancer Your health  care provider may recommend that you be screened regularly for cancer of the pelvic organs (ovaries, uterus, and vagina). This screening involves a pelvic examination, including checking for microscopic changes to the surface of your cervix (Pap test). You may be encouraged to have this screening done every 3 years, beginning at age 21.  For women ages 30-65, health care providers may recommend pelvic exams and Pap testing every 3 years, or they may recommend the Pap and pelvic exam, combined with testing for human papilloma virus (HPV), every 5 years. Some types of HPV increase your risk of cervical cancer. Testing for HPV may also be done on women of any age with unclear Pap test results.  Other health care providers may not recommend any screening for nonpregnant women who are considered low risk for pelvic cancer and who do not have symptoms. Ask your health care provider if a screening pelvic exam is right for you.  If you have had past treatment for cervical cancer or a condition that could lead to cancer, you need Pap tests and screening for cancer for at least 20 years after your treatment. If Pap tests have been discontinued, your risk factors (such as having a new sexual partner) need to be reassessed to determine if screening should resume. Some women have medical problems that increase the chance of getting cervical cancer. In these cases, your health care provider may recommend more frequent screening and Pap tests. Colorectal Cancer  This type of cancer can be detected and often prevented.  Routine colorectal cancer screening usually begins at 52 years of age and continues through 52 years of age.  Your health care provider may recommend screening at an earlier age if you have risk factors for colon cancer.  Your health care provider may also recommend using home test kits to check for hidden blood in the stool.  A small camera at the end of a tube can be used to examine your colon  directly (sigmoidoscopy or colonoscopy). This is done to check for the earliest forms of colorectal cancer.  Routine screening usually begins at age 50.  Direct examination of the colon should be repeated every 5-10 years through 52 years of age. However, you may need to be screened more often if early forms of precancerous polyps or small growths are found. Skin Cancer  Check your skin from head to toe regularly.  Tell your health care provider about any new moles or changes in moles, especially if there is a change in a mole's shape or color.  Also tell your health care provider if you have a mole that is larger than the size of a pencil eraser.  Always use sunscreen. Apply sunscreen liberally and repeatedly throughout the day.  Protect yourself by wearing long sleeves, pants, a wide-brimmed hat, and sunglasses whenever you are outside. HEART DISEASE, DIABETES, AND HIGH BLOOD PRESSURE   High blood pressure causes heart disease and increases the risk   blood pressure is more likely to develop in:  People who have blood pressure in the high end of the normal range (130-139/85-89 mm Hg).  People who are overweight or obese.  People who are African American.  If you are 18-39 years of age, have your blood pressure checked every 3-5 years. If you are 40 years of age or older, have your blood pressure checked every year. You should have your blood pressure measured twice--once when you are at a hospital or clinic, and once when you are not at a hospital or clinic. Record the average of the two measurements. To check your blood pressure when you are not at a hospital or clinic, you can use:  An automated blood pressure machine at a pharmacy.  A home blood pressure monitor.  If you are between 55 years and 79 years old, ask your health care provider if you should take aspirin to prevent strokes.  Have regular diabetes screenings. This involves taking a blood sample to check your  fasting blood sugar level.  If you are at a normal weight and have a low risk for diabetes, have this test once every three years after 52 years of age.  If you are overweight and have a high risk for diabetes, consider being tested at a younger age or more often. PREVENTING INFECTION  Hepatitis B  If you have a higher risk for hepatitis B, you should be screened for this virus. You are considered at high risk for hepatitis B if:  You were born in a country where hepatitis B is common. Ask your health care provider which countries are considered high risk.  Your parents were born in a high-risk country, and you have not been immunized against hepatitis B (hepatitis B vaccine).  You have HIV or AIDS.  You use needles to inject street drugs.  You live with someone who has hepatitis B.  You have had sex with someone who has hepatitis B.  You get hemodialysis treatment.  You take certain medicines for conditions, including cancer, organ transplantation, and autoimmune conditions. Hepatitis C  Blood testing is recommended for:  Everyone born from 1945 through 1965.  Anyone with known risk factors for hepatitis C. Sexually transmitted infections (STIs)  You should be screened for sexually transmitted infections (STIs) including gonorrhea and chlamydia if:  You are sexually active and are younger than 52 years of age.  You are older than 52 years of age and your health care provider tells you that you are at risk for this type of infection.  Your sexual activity has changed since you were last screened and you are at an increased risk for chlamydia or gonorrhea. Ask your health care provider if you are at risk.  If you do not have HIV, but are at risk, it may be recommended that you take a prescription medicine daily to prevent HIV infection. This is called pre-exposure prophylaxis (PrEP). You are considered at risk if:  You are sexually active and do not regularly use condoms or  know the HIV status of your partner(s).  You take drugs by injection.  You are sexually active with a partner who has HIV. Talk with your health care provider about whether you are at high risk of being infected with HIV. If you choose to begin PrEP, you should first be tested for HIV. You should then be tested every 3 months for as long as you are taking PrEP.  PREGNANCY   If you are   If you are premenopausal and you may become pregnant, ask your health care provider about preconception counseling.  If you may become pregnant, take 400 to 800 micrograms (mcg) of folic acid every day.  If you want to prevent pregnancy, talk to your health care provider about birth control (contraception). OSTEOPOROSIS AND MENOPAUSE   Osteoporosis is a disease in which the bones lose minerals and strength with aging. This can result in serious bone fractures. Your risk for osteoporosis can be identified using a bone density scan.  If you are 65 years of age or older, or if you are at risk for osteoporosis and fractures, ask your health care provider if you should be screened.  Ask your health care provider whether you should take a calcium or vitamin D supplement to lower your risk for osteoporosis.  Menopause may have certain physical symptoms and risks.  Hormone replacement therapy may reduce some of these symptoms and risks. Talk to your health care provider about whether hormone replacement therapy is right for you.  HOME CARE INSTRUCTIONS   Schedule regular health, dental, and eye exams.  Stay current with your immunizations.   Do not use any tobacco products including cigarettes, chewing tobacco, or electronic cigarettes.  If you are pregnant, do not drink alcohol.  If you are breastfeeding, limit how much and how often you drink alcohol.  Limit alcohol intake to no more than 1 drink per day for nonpregnant women. One drink equals 12 ounces of beer, 5 ounces of wine, or 1 ounces of hard  liquor.  Do not use street drugs.  Do not share needles.  Ask your health care provider for help if you need support or information about quitting drugs.  Tell your health care provider if you often feel depressed.  Tell your health care provider if you have ever been abused or do not feel safe at home.   This information is not intended to replace advice given to you by your health care provider. Make sure you discuss any questions you have with your health care provider.   Document Released: 01/02/2011 Document Revised: 07/10/2014 Document Reviewed: 05/21/2013 Elsevier Interactive Patient Education 2016 [employer]  

## 2016-04-03 NOTE — Progress Notes (Signed)
Pre visit review using our clinic review tool, if applicable. No additional management support is needed unless otherwise documented below in the visit note. 

## 2016-04-06 LAB — CYTOLOGY - PAP

## 2016-04-06 NOTE — Progress Notes (Signed)
Has been abstracted.

## 2016-04-10 ENCOUNTER — Other Ambulatory Visit (INDEPENDENT_AMBULATORY_CARE_PROVIDER_SITE_OTHER): Payer: BLUE CROSS/BLUE SHIELD

## 2016-04-10 DIAGNOSIS — Z0000 Encounter for general adult medical examination without abnormal findings: Secondary | ICD-10-CM | POA: Diagnosis not present

## 2016-04-10 LAB — LIPID PANEL
Cholesterol: 190 mg/dL (ref 0–200)
HDL: 38 mg/dL — ABNORMAL LOW (ref >39.00)
NonHDL: 151.54
Total CHOL/HDL Ratio: 5
Triglycerides: 387 mg/dL — ABNORMAL HIGH (ref 0.0–149.0)
VLDL: 77.4 mg/dL — ABNORMAL HIGH (ref 0.0–40.0)

## 2016-04-10 LAB — CBC WITH DIFFERENTIAL/PLATELET
Basophils Absolute: 0.1 10*3/uL (ref 0.0–0.1)
Basophils Relative: 0.9 % (ref 0.0–3.0)
Eosinophils Absolute: 0.3 10*3/uL (ref 0.0–0.7)
Eosinophils Relative: 3.4 % (ref 0.0–5.0)
HCT: 44.4 % (ref 36.0–46.0)
Hemoglobin: 15.1 g/dL — ABNORMAL HIGH (ref 12.0–15.0)
Lymphocytes Relative: 20.7 % (ref 12.0–46.0)
Lymphs Abs: 2.1 10*3/uL (ref 0.7–4.0)
MCHC: 33.9 g/dL (ref 30.0–36.0)
MCV: 91.9 fl (ref 78.0–100.0)
Monocytes Absolute: 0.6 10*3/uL (ref 0.1–1.0)
Monocytes Relative: 6.4 % (ref 3.0–12.0)
Neutro Abs: 6.8 10*3/uL (ref 1.4–7.7)
Neutrophils Relative %: 68.6 % (ref 43.0–77.0)
Platelets: 246 10*3/uL (ref 150.0–400.0)
RBC: 4.83 Mil/uL (ref 3.87–5.11)
RDW: 13.5 % (ref 11.5–15.5)
WBC: 9.9 10*3/uL (ref 4.0–10.5)

## 2016-04-10 LAB — LDL CHOLESTEROL, DIRECT: Direct LDL: 105 mg/dL

## 2016-04-10 LAB — COMPREHENSIVE METABOLIC PANEL
ALT: 11 U/L (ref 0–35)
AST: 12 U/L (ref 0–37)
Albumin: 3.8 g/dL (ref 3.5–5.2)
Alkaline Phosphatase: 81 U/L (ref 39–117)
BUN: 11 mg/dL (ref 6–23)
CO2: 31 mEq/L (ref 19–32)
Calcium: 9.7 mg/dL (ref 8.4–10.5)
Chloride: 101 mEq/L (ref 96–112)
Creatinine, Ser: 0.73 mg/dL (ref 0.40–1.20)
GFR: 88.97 mL/min (ref >60.00)
Glucose, Bld: 95 mg/dL (ref 70–99)
Potassium: 4.5 mEq/L (ref 3.5–5.1)
Sodium: 139 mEq/L (ref 135–145)
Total Bilirubin: 0.3 mg/dL (ref 0.2–1.2)
Total Protein: 7.2 g/dL (ref 6.0–8.3)

## 2016-04-10 LAB — VITAMIN D 25 HYDROXY (VIT D DEFICIENCY, FRACTURES): VITD: 12.65 ng/mL — ABNORMAL LOW (ref 30.00–100.00)

## 2016-04-10 LAB — TSH: TSH: 1.12 u[IU]/mL (ref 0.35–4.50)

## 2016-04-18 ENCOUNTER — Emergency Department: Payer: BLUE CROSS/BLUE SHIELD

## 2016-04-18 ENCOUNTER — Encounter: Payer: Self-pay | Admitting: Emergency Medicine

## 2016-04-18 ENCOUNTER — Emergency Department
Admission: EM | Admit: 2016-04-18 | Discharge: 2016-04-18 | Disposition: A | Discharge status: Home / Self Care | Payer: BLUE CROSS/BLUE SHIELD | Attending: Emergency Medicine | Admitting: Emergency Medicine

## 2016-04-18 ENCOUNTER — Telehealth: Payer: Self-pay | Admitting: *Deleted

## 2016-04-18 DIAGNOSIS — I10 Essential (primary) hypertension: Secondary | ICD-10-CM | POA: Insufficient documentation

## 2016-04-18 DIAGNOSIS — Y9389 Activity, other specified: Secondary | ICD-10-CM | POA: Insufficient documentation

## 2016-04-18 DIAGNOSIS — X503XXA Overexertion from repetitive movements, initial encounter: Secondary | ICD-10-CM | POA: Insufficient documentation

## 2016-04-18 DIAGNOSIS — F17200 Nicotine dependence, unspecified, uncomplicated: Secondary | ICD-10-CM | POA: Diagnosis not present

## 2016-04-18 DIAGNOSIS — Y929 Unspecified place or not applicable: Secondary | ICD-10-CM | POA: Diagnosis not present

## 2016-04-18 DIAGNOSIS — J449 Chronic obstructive pulmonary disease, unspecified: Secondary | ICD-10-CM | POA: Diagnosis not present

## 2016-04-18 DIAGNOSIS — M62838 Other muscle spasm: Secondary | ICD-10-CM | POA: Insufficient documentation

## 2016-04-18 DIAGNOSIS — Z0000 Encounter for general adult medical examination without abnormal findings: Secondary | ICD-10-CM

## 2016-04-18 DIAGNOSIS — R51 Headache: Secondary | ICD-10-CM | POA: Diagnosis not present

## 2016-04-18 DIAGNOSIS — J441 Chronic obstructive pulmonary disease with (acute) exacerbation: Secondary | ICD-10-CM

## 2016-04-18 DIAGNOSIS — M542 Cervicalgia: Secondary | ICD-10-CM | POA: Diagnosis not present

## 2016-04-18 DIAGNOSIS — F4323 Adjustment disorder with mixed anxiety and depressed mood: Secondary | ICD-10-CM | POA: Diagnosis not present

## 2016-04-18 DIAGNOSIS — Y990 Civilian activity done for income or pay: Secondary | ICD-10-CM | POA: Insufficient documentation

## 2016-04-18 LAB — BASIC METABOLIC PANEL
Anion gap: 8 (ref 5–15)
BUN: 8 mg/dL (ref 6–20)
CO2: 25 mmol/L (ref 22–32)
Calcium: 9.3 mg/dL (ref 8.9–10.3)
Chloride: 105 mmol/L (ref 101–111)
Creatinine, Ser: 0.68 mg/dL (ref 0.44–1.00)
GFR calc Af Amer: >60 mL/min (ref >60)
GFR calc non Af Amer: >60 mL/min (ref >60)
Glucose, Bld: 109 mg/dL — ABNORMAL HIGH (ref 65–99)
Potassium: 4.3 mmol/L (ref 3.5–5.1)
Sodium: 138 mmol/L (ref 135–145)

## 2016-04-18 LAB — TROPONIN I: Troponin I: <0.03 ng/mL (ref <0.03)

## 2016-04-18 LAB — CBC
HCT: 44.5 % (ref 35.0–47.0)
Hemoglobin: 15.5 g/dL (ref 12.0–16.0)
MCH: 32.3 pg (ref 26.0–34.0)
MCHC: 34.9 g/dL (ref 32.0–36.0)
MCV: 92.7 fL (ref 80.0–100.0)
Platelets: 217 10*3/uL (ref 150–440)
RBC: 4.81 MIL/uL (ref 3.80–5.20)
RDW: 13.9 % (ref 11.5–14.5)
WBC: 8 10*3/uL (ref 3.6–11.0)

## 2016-04-18 MED ORDER — CYCLOBENZAPRINE HCL 10 MG PO TABS: 10.0000 mg | ORAL_TABLET | Freq: Three times a day (TID) | ORAL | 0 refills | Status: DC | PRN

## 2016-04-18 MED ORDER — IBUPROFEN 800 MG PO TABS: 800.0000 mg | ORAL_TABLET | Freq: Three times a day (TID) | ORAL | 0 refills | Status: DC | PRN
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
## 2016-04-18 NOTE — ED Triage Notes (Signed)
Pt to ed with c/o headache and neck pain that started on Sunday.  Pt reports pain all over face today.

## 2016-04-18 NOTE — Discharge Instructions (Signed)
You were evaluated for pain that I suspect is due to muscle spasms. Your exam and evaluation are reassuring in the emergency department stay.  Return to the emergency department for any worsening condition such as worsening pain, weakness or numbness, tingling, skin rash, trouble swallowing, or any other symptoms concerning to you.

## 2016-04-18 NOTE — Telephone Encounter (Signed)
Pt requested a medication refill for Symbicort  Pharmacy [facility] [place]

## 2016-04-19 MED ORDER — BUDESONIDE-FORMOTEROL FUMARATE 160-4.5 MCG/ACT IN AERO: 2.0000 | INHALATION_SPRAY | Freq: Two times a day (BID) | RESPIRATORY_TRACT | 3 refills | Status: DC

## 2016-04-19 NOTE — Telephone Encounter (Signed)
Medication has been refilled.

## 2016-04-21 ENCOUNTER — Telehealth: Payer: Self-pay | Admitting: Family

## 2016-04-21 DIAGNOSIS — J441 Chronic obstructive pulmonary disease with (acute) exacerbation: Secondary | ICD-10-CM

## 2016-04-21 DIAGNOSIS — Z0000 Encounter for general adult medical examination without abnormal findings: Secondary | ICD-10-CM

## 2016-04-21 NOTE — Telephone Encounter (Signed)
Pt called and stated the pharmacy does not have an rx for the budesonide-formoterol (SYMBICORT) 160-4.5 MCG/ACT inhaler. Can we please resend. Thank you!  Pharmacy - [facility] [address] - [place] ([place]), [place] - [address]   Call pt @ [phone]

## 2016-04-25 MED ORDER — BUDESONIDE-FORMOTEROL FUMARATE 160-4.5 MCG/ACT IN AERO: 2.0000 | INHALATION_SPRAY | Freq: Two times a day (BID) | RESPIRATORY_TRACT | 3 refills | Status: DC

## 2016-04-25 NOTE — Telephone Encounter (Signed)
Medication has been refilled.

## 2016-05-02 ENCOUNTER — Telehealth: Payer: Self-pay | Admitting: Family

## 2016-05-02 ENCOUNTER — Ambulatory Visit
Admission: RE | Admit: 2016-05-02 | Discharge: 2016-05-02 | Disposition: A | Discharge status: Home / Self Care | Payer: BLUE CROSS/BLUE SHIELD | Source: Ambulatory Visit | Attending: Family | Admitting: Family

## 2016-05-02 DIAGNOSIS — J441 Chronic obstructive pulmonary disease with (acute) exacerbation: Secondary | ICD-10-CM

## 2016-05-02 DIAGNOSIS — Z1231 Encounter for screening mammogram for malignant neoplasm of breast: Secondary | ICD-10-CM | POA: Insufficient documentation

## 2016-05-02 DIAGNOSIS — Z0000 Encounter for general adult medical examination without abnormal findings: Secondary | ICD-10-CM

## 2016-05-02 NOTE — Telephone Encounter (Signed)
PT called and stated that [facility] says that her prescription for budesonide-formoterol (SYMBICORT) 160-4.5 MCG/ACT inhaler is not there. Can we please resend. Thank you!  Call pt @ [phone]  Pharmacy - [facility] - [place] ([place]), [place] - [address]

## 2016-05-03 MED ORDER — BUDESONIDE-FORMOTEROL FUMARATE 160-4.5 MCG/ACT IN AERO: 2.0000 | INHALATION_SPRAY | Freq: Two times a day (BID) | RESPIRATORY_TRACT | 3 refills | Status: DC

## 2016-05-03 NOTE — Telephone Encounter (Signed)
Pt stated that she has gone down to pharmacy and state that they don't have it.

## 2016-05-03 NOTE — Telephone Encounter (Signed)
Medication was refilled 24OCT2017.

## 2016-05-03 NOTE — Telephone Encounter (Signed)
Medication has been refilled a second time. Pharmacy did not receive last refill.

## 2016-07-05 ENCOUNTER — Ambulatory Visit (INDEPENDENT_AMBULATORY_CARE_PROVIDER_SITE_OTHER): Payer: BLUE CROSS/BLUE SHIELD

## 2016-07-05 ENCOUNTER — Ambulatory Visit (INDEPENDENT_AMBULATORY_CARE_PROVIDER_SITE_OTHER): Payer: BLUE CROSS/BLUE SHIELD | Admitting: Family

## 2016-07-05 ENCOUNTER — Encounter: Payer: Self-pay | Admitting: Family

## 2016-07-05 VITALS — BP 142/90 | HR 73 | Temp 97.6°F | Resp 14 | Wt 183.8 lb

## 2016-07-05 DIAGNOSIS — J449 Chronic obstructive pulmonary disease, unspecified: Secondary | ICD-10-CM | POA: Diagnosis not present

## 2016-07-05 DIAGNOSIS — I10 Essential (primary) hypertension: Secondary | ICD-10-CM | POA: Diagnosis not present

## 2016-07-05 DIAGNOSIS — M79645 Pain in left finger(s): Secondary | ICD-10-CM

## 2016-07-05 DIAGNOSIS — M79644 Pain in right finger(s): Secondary | ICD-10-CM

## 2016-07-05 MED ORDER — ALBUTEROL SULFATE (2.5 MG/3ML) 0.083% IN NEBU: 2.5000 mg | INHALATION_SOLUTION | Freq: Four times a day (QID) | RESPIRATORY_TRACT | 1 refills | Status: DC | PRN

## 2016-07-05 MED ORDER — GUAIFENESIN ER 600 MG PO TB12: 1200.0000 mg | ORAL_TABLET | Freq: Two times a day (BID) | ORAL | 3 refills | Status: DC

## 2016-07-05 MED ORDER — HYDROCOD POLST-CPM POLST ER 10-8 MG/5ML PO SUER: 5.0000 mL | Freq: Every evening | ORAL | 0 refills | Status: DC | PRN

## 2016-07-05 MED ORDER — AMLODIPINE BESYLATE 5 MG PO TABS: 5.0000 mg | ORAL_TABLET | Freq: Every day | ORAL | 3 refills | Status: DC

## 2016-07-05 NOTE — Progress Notes (Signed)
Pre visit review using our clinic review tool, if applicable. No additional management support is needed unless otherwise documented below in the visit note. 

## 2016-07-05 NOTE — Progress Notes (Signed)
Subjective:    Patient ID: Allison Brennan, female    DOB: 04-12-1966, 51 y.o.   MRN: [id number]  CC: Allison Brennan is a 51 y.o. female who presents today for follow up.   HPI: COPD- Notes wheezing, productive coughing. Describes chest tightness when cough x one week. Describes left back pain 'like a rib has broke or pulled muscle' 'only when coughing. No fever,  Chills.  H/O htn- came off of medications due to cost. Denies exertional chest pain or pressure, numbness or tingling radiating to left arm or jaw, palpitations, dizziness, frequent headaches, changes in vision, or shortness of breath.   Thumb pain- worsening. Works making labels and cutting them.  Had bilateral CMC joint injections from Dr. Katrinka Blazing 08/2015. Topical voltaren with some relief.         HISTORY:  Past Medical History:  Diagnosis Date  . Back pain, lumbosacral   . COPD (chronic obstructive pulmonary disease) (HCC)    no pulmonologist  . [id number](478.2)   . Hypertension   . PPD positive, treated 1998   Past Surgical History:  Procedure Laterality Date  . CESAREAN SECTION     x2  . CLOSED REDUCTION FINGER WITH PERCUTANEOUS PINNING Left 06/17/2015   Procedure: CLOSED REDUCTION FINGER WITH PERCUTANEOUS PINNING little finger;  Surgeon: Kennedy Bucker, MD;  Location: [facility];  Service: Orthopedics;  Laterality: Left;  . EXOSTECTECTOMY TOE Right 02/12/2015   Procedure: EXOSTECTECTOMY TOE;  Surgeon: Linus Galas, MD;  Location: [facility];  Service: Podiatry;  Laterality: Right;  . KNEE ARTHROSCOPY  03/13/2012   Procedure: ARTHROSCOPY KNEE;  Surgeon: Loanne Drilling, MD;  Location: [facility];  Service: Orthopedics;  Laterality: Right;  debridementt right medial meniscus/chrondroplasty  . PARTIAL HYSTERECTOMY  1999   pelvic pain   Family History  Problem Relation Age of Onset  . Diverticulitis Mother   . Deep vein thrombosis Mother   . Arthritis Maternal Grandmother   . Osteoporosis Maternal Grandmother   . Cancer  Maternal Aunt     brain  . Breast cancer Neg Hx     Allergies: Lisinopril Current Outpatient Prescriptions on File Prior to Visit  Medication Sig Dispense Refill  . budesonide-formoterol (SYMBICORT) 160-4.5 MCG/ACT inhaler Inhale 2 puffs into the lungs 2 (two) times daily. 1 Inhaler 3  . PROAIR HFA 108 (90 Base) MCG/ACT inhaler   0   No current facility-administered medications on file prior to visit.     Social History  Substance Use Topics  . Smoking status: Current Every Day Smoker    Packs/day: 1.00    Years: 28.00  . Smokeless tobacco: Never Used     Comment: Decline Cessation information/SLS  . Alcohol use Yes     Comment: 1-2 drinks beer every day    Review of Systems  Constitutional: Negative for chills and fever.  HENT: Positive for congestion. Negative for ear pain, sinus pain and sinus pressure.   Respiratory: Positive for cough, chest tightness and wheezing. Negative for shortness of breath.   Cardiovascular: Negative for chest pain and palpitations.  Gastrointestinal: Negative for nausea and vomiting.  Musculoskeletal: Negative for joint swelling.      Objective:    BP (!) 142/90   Pulse 73   Temp 97.6 F (36.4 C) (Oral)   Resp 14   Wt 183 lb 12.8 oz (83.4 kg)   SpO2 96%   BMI 31.55 kg/m  BP Readings from Last 3 Encounters:  07/05/16 (!) 142/90  04/18/16 Marland Kitchen)  182/103  04/03/16 (!) 144/80   Wt Readings from Last 3 Encounters:  07/05/16 183 lb 12.8 oz (83.4 kg)  04/18/16 179 lb (81.2 kg)  04/03/16 179 lb 9.6 oz (81.5 kg)    Physical Exam  Constitutional: She appears well-developed and well-nourished.  Eyes: Conjunctivae are normal.  Cardiovascular: Normal rate, regular rhythm, normal heart sounds and normal pulses.   Pulmonary/Chest: Effort normal and breath sounds normal. She has no wheezes. She has no rhonchi. She has no rales.  Musculoskeletal:  Bilateral CMC tenderness. No erythema, swelling.   Neurological: She is alert.  Skin: Skin is  warm and dry.  Psychiatric: She has a normal mood and affect. Her speech is normal and behavior is normal. Thought content normal.  Vitals reviewed.      Assessment & Plan:   Problem List Items Addressed This Visit      Cardiovascular and Mediastinum   Essential hypertension    DBP elevated. Trial amlodipine; ACE caused cough. F/u one week      Relevant Medications   amLODipine (NORVASC) 5 MG tablet     Respiratory   COPD (chronic obstructive pulmonary disease) (HCC) - Primary    Afebrile. No adventitious lung sounds. Pending chest x-ray. Cough syrup at bedtime, given albuterol nebulizer solution. Education provided on maintenance, rescue inhalers. Pending PFTs      Relevant Medications   albuterol (PROVENTIL) (2.5 MG/3ML) 0.083% nebulizer solution   guaiFENesin (MUCINEX) 600 MG 12 hr tablet   chlorpheniramine-HYDROcodone (TUSSIONEX PENNKINETIC ER) 10-8 MG/5ML SUER   Other Relevant Orders   Pulmonary function test   DG Chest 2 View     Other   Bilateral thumb pain    Symptoms consistent with arthritis. Some relief with Voltaren gel and encouraged patient to continue using this. Patient does not want to go back to [facility] to see Dr Katrinka Blazing, I advised her to walk in at [facility] since local which she can receive joint injections. Will follow.           I have discontinued Ms. Lisby mupirocin ointment, ibuprofen, and cyclobenzaprine. I am also having her start on albuterol, guaiFENesin, chlorpheniramine-HYDROcodone, and amLODipine. Additionally, I am having her maintain her PROAIR HFA and budesonide-formoterol.   Meds ordered this encounter  Medications  . albuterol (PROVENTIL) (2.5 MG/3ML) 0.083% nebulizer solution    Sig: Take 3 mLs (2.5 mg total) by nebulization every 6 (six) hours as needed for wheezing or shortness of breath.    Dispense:  150 mL    Refill:  1    Order Specific Question:   Supervising Provider    Answer:   Darrick Huntsman, TERESA L [[id number]]  .  guaiFENesin (MUCINEX) 600 MG 12 hr tablet    Sig: Take 2 tablets (1,200 mg total) by mouth 2 (two) times daily.    Dispense:  28 tablet    Refill:  3    Order Specific Question:   Supervising Provider    Answer:   Duncan Dull L [[id number]]  . chlorpheniramine-HYDROcodone (TUSSIONEX PENNKINETIC ER) 10-8 MG/5ML SUER    Sig: Take 5 mLs by mouth at bedtime as needed for cough.    Dispense:  140 mL    Refill:  0    Order Specific Question:   Supervising Provider    Answer:   Duncan Dull L [[id number]]  . amLODipine (NORVASC) 5 MG tablet    Sig: Take 1 tablet (5 mg total) by mouth daily.    Dispense:  90 tablet  Refill:  3    Order Specific Question:   Supervising Provider    Answer:   Sherlene ShamsULLO, TERESA L [[id number]]    Return precautions given.   Risks, benefits, and alternatives of the medications and treatment plan prescribed today were discussed, and patient expressed understanding.   Education regarding symptom management and diagnosis given to patient on AVS.  Continue to follow with Rennie PlowmanMargaret Kaelin Holford, FNP for routine health maintenance.   Allison SongAngela R Elwell and I agreed with plan.   Rennie PlowmanMargaret Serinity Ware, FNP

## 2016-07-05 NOTE — Patient Instructions (Addendum)
mucinex with lots of water  Please take cough medication at night only as needed. As we discussed, I do not recommend dosing throughout the day as coughing is a protective mechanism . It also helps to break up thick mucous.  Do not take cough suppressants with alcohol as can lead to trouble breathing. Advise caution if taking cough suppressant and operating machinery ( i.e driving a car) as you may feel very tired.   Use albuterol every 6 hours for first 24 hours to get good medication into the lungs and loosen congestion; after, you may use as needed and eventually stop all together when cough resolves.   Continue using voltaren gel for thumbs.   Over-the-counter medications you may try for arthritic pain include:   ThermaCare patches   Capsaicin cream   Icy hot  If your symptom do not improve, you may go to walk in orthopedic clinic for joint injection. Information below:  [facility] [address]  M-F 1-7:30pm  [phone]  If there is no improvement in your symptoms, or if there is any worsening of symptoms, or if you have any additional concerns, please return for re-evaluation; or, if we are closed, consider going to the Emergency Room for evaluation if symptoms urgent.   F/u one week for blood pressure

## 2016-07-05 NOTE — Assessment & Plan Note (Signed)
Symptoms consistent with arthritis. Some relief with Voltaren gel and encouraged patient to continue using this. [facility], I advised her to walk in at [facility] since local which she can receive joint injections. Will follow.

## 2016-07-05 NOTE — Assessment & Plan Note (Signed)
Afebrile. No adventitious lung sounds. Pending chest x-ray. Cough syrup at bedtime, given albuterol nebulizer solution. Education provided on maintenance, rescue inhalers. Pending PFTs

## 2016-07-05 NOTE — Assessment & Plan Note (Signed)
DBP elevated. Trial amlodipine; ACE caused cough. F/u one week

## 2016-07-06 ENCOUNTER — Telehealth: Payer: Self-pay | Admitting: Family

## 2016-07-06 DIAGNOSIS — E785 Hyperlipidemia, unspecified: Secondary | ICD-10-CM

## 2016-07-06 MED ORDER — PRAVASTATIN SODIUM 40 MG PO TABS: 40.0000 mg | ORAL_TABLET | Freq: Every day | ORAL | 2 refills | Status: DC

## 2016-07-06 NOTE — Telephone Encounter (Signed)
Call pt cxr doesn't show PNA which is great news.   Let me know if she doesn't get better with mucinex and PRN cough syrup. Please encourage use on inhalers  Also CXR shows cholesterol; although recent labs show moderate cholesterol, since we have seen it on xray, I would like her to start a medication for this. Let me know if she muscle cramps and that is the most common side effect.   Also, ask her how her BP is now that she is  Back on amlodipine?

## 2016-07-06 NOTE — Telephone Encounter (Signed)
Left message for patient to return call back.  

## 2016-07-07 NOTE — Telephone Encounter (Signed)
Pt called back but is unable to hear phone at work. She will call back later for the results.

## 2016-07-10 ENCOUNTER — Telehealth: Payer: Self-pay | Admitting: *Deleted

## 2016-07-10 NOTE — Telephone Encounter (Signed)
Patient requested Xray results if available  Pt contact [phone]

## 2016-07-10 NOTE — Telephone Encounter (Signed)
Left message for patient to return call back.  

## 2016-07-13 ENCOUNTER — Ambulatory Visit (INDEPENDENT_AMBULATORY_CARE_PROVIDER_SITE_OTHER): Payer: BLUE CROSS/BLUE SHIELD

## 2016-07-13 VITALS — BP 132/84 | HR 69 | Resp 18

## 2016-07-13 DIAGNOSIS — I10 Essential (primary) hypertension: Secondary | ICD-10-CM

## 2016-07-13 NOTE — Telephone Encounter (Signed)
Mail letter including my message below-  Please advise f/u for BP since she is back on amlodipine

## 2016-07-13 NOTE — Telephone Encounter (Signed)
Letter has been mailed.

## 2016-07-13 NOTE — Progress Notes (Addendum)
Patient comes in for 1 week blood pressure check.  She is complaint with taking blood pressure medication. Patient has no way to manage blood pressure at home.  Please advise.  Kristen, BP at goal ( less than 140/90). May continue current regimen.    Please note patient is in compliance with taking blood pressure medications.  KB,CMA

## 2016-07-13 NOTE — Telephone Encounter (Signed)
Left message for patient to return call back.  

## 2016-07-18 ENCOUNTER — Telehealth: Payer: Self-pay | Admitting: *Deleted

## 2016-07-18 NOTE — Telephone Encounter (Signed)
Patient call returned.  See clinical note for reference DOS 07/13/16

## 2016-07-18 NOTE — Progress Notes (Signed)
Left message to call.

## 2016-07-18 NOTE — Progress Notes (Signed)
Patient advised and verbalized an understanding  

## 2016-07-18 NOTE — Telephone Encounter (Signed)
Pt requested a call [phone]

## 2016-08-01 ENCOUNTER — Ambulatory Visit (INDEPENDENT_AMBULATORY_CARE_PROVIDER_SITE_OTHER): Payer: BLUE CROSS/BLUE SHIELD | Admitting: Family

## 2016-08-01 ENCOUNTER — Encounter: Payer: Self-pay | Admitting: Family

## 2016-08-01 VITALS — BP 142/78 | HR 80 | Temp 97.7°F | Ht 64.0 in | Wt 181.6 lb

## 2016-08-01 DIAGNOSIS — J00 Acute nasopharyngitis [common cold]: Secondary | ICD-10-CM

## 2016-08-01 DIAGNOSIS — M25511 Pain in right shoulder: Secondary | ICD-10-CM

## 2016-08-01 MED ORDER — IBUPROFEN 600 MG PO TABS: 600.0000 mg | ORAL_TABLET | Freq: Three times a day (TID) | ORAL | 0 refills | Status: DC | PRN

## 2016-08-01 MED ORDER — FLUTICASONE PROPIONATE 50 MCG/ACT NA SUSP: 2.0000 | Freq: Every day | NASAL | 2 refills | Status: DC

## 2016-08-01 NOTE — Progress Notes (Signed)
Pre visit review using our clinic review tool, if applicable. No additional management support is needed unless otherwise documented below in the visit note. 

## 2016-08-01 NOTE — Assessment & Plan Note (Signed)
Trial of Flonase. Patient will let me know if she is not better.

## 2016-08-01 NOTE — Patient Instructions (Addendum)
May try claritin or zyrtec for nasal discharge  As discussed, let's treat conservatively first  alternate between ice and heat. Short course of ibuprofen. Exercises below.   If no improvement, please let me know and we'll place referral to orthopedics.  Shoulder Impingement Syndrome Rehab Ask your health care provider which exercises are safe for you. Do exercises exactly as told by your health care provider and adjust them as directed. It is normal to feel mild stretching, pulling, tightness, or discomfort as you do these exercises, but you should stop right away if you feel sudden pain or your pain gets worse.Do not begin these exercises until told by your health care provider. Stretching and range of motion exercise This exercise warms up your muscles and joints and improves the movement and flexibility of your shoulder. This exercise also helps to relieve pain and stiffness. Exercise A: Passive horizontal adduction 1. Sit or stand and pull your left / right elbow across your chest, toward your other shoulder. Stop when you feel a gentle stretch in the back of your shoulder and upper arm.  Keep your arm at shoulder height.  Keep your arm as close to your body as you comfortably can. 2. Hold for __________ seconds. 3. Slowly return to the starting position. Repeat __________ times. Complete this exercise __________ times a day. Strengthening exercises These exercises build strength and endurance in your shoulder. Endurance is the ability to use your muscles for a long time, even after they get tired. Exercise B: External rotation, isometric 1. Stand or sit in a doorway, facing the door frame. 2. Bend your left / right elbow and place the back of your wrist against the door frame. Only your wrist should be touching the frame. Keep your upper arm at your side. 3. Gently press your wrist against the door frame, as if you are trying to push your arm away from your abdomen.  Avoid shrugging  your shoulder while you press your hand against the door frame. Keep your shoulder blade tucked down toward the middle of your back. 4. Hold for __________ seconds. 5. Slowly release the tension, and relax your muscles completely before you do the exercise again. Repeat __________ times. Complete this exercise __________ times a day. Exercise C: Internal rotation, isometric 1. Stand or sit in a doorway, facing the door frame. 2. Bend your left / right elbow and place the inside of your wrist against the door frame. Only your wrist should be touching the frame. Keep your upper arm at your side. 3. Gently press your wrist against the door frame, as if you are trying to push your arm toward your abdomen.  Avoid shrugging your shoulder while you press your hand against the door frame. Keep your shoulder blade tucked down toward the middle of your back. 4. Hold for __________ seconds. 5. Slowly release the tension, and relax your muscles completely before you do the exercise again. Repeat __________ times. Complete this exercise __________ times a day. Exercise D: Scapular protraction, supine 1. Lie on your back on a firm surface. Hold a __________ weight in your left / right hand. 2. Raise your left / right arm straight into the air so your hand is directly above your shoulder joint. 3. Push the weight into the air so your shoulder lifts off of the surface that you are lying on. Do not move your head, neck, or back. 4. Hold for __________ seconds. 5. Slowly return to the starting position. Let your muscles  relax completely before you repeat this exercise. Repeat __________ times. Complete this exercise __________ times a day. Exercise E: Scapular retraction 1. Sit in a stable chair without armrests, or stand. 2. Secure an exercise band to a stable object in front of you so the band is at shoulder height. 3. Hold one end of the exercise band in each hand. Your palms should face down. 4. Squeeze  your shoulder blades together and move your elbows slightly behind you. Do not shrug your shoulders while you do this. 5. Hold for __________ seconds. 6. Slowly return to the starting position. Repeat __________ times. Complete this exercise __________ times a day. Exercise F: Shoulder extension 1. Sit in a stable chair without armrests, or stand. 2. Secure an exercise band to a stable object in front of you where the band is above shoulder height. 3. Hold one end of the exercise band in each hand. 4. Straighten your elbows and lift your hands up to shoulder height. 5. Squeeze your shoulder blades together and pull your hands down to the sides of your thighs. Stop when your hands are straight down by your sides. Do not let your hands go behind your body. 6. Hold for __________ seconds. 7. Slowly return to the starting position. Repeat __________ times. Complete this exercise __________ times a day. This information is not intended to replace advice given to you by your health care provider. Make sure you discuss any questions you have with your health care provider. Document Released: 06/19/2005 Document Revised: 02/24/2016 Document Reviewed: 05/22/2015 Elsevier Interactive Patient Education  2017 [employer].

## 2016-08-01 NOTE — Assessment & Plan Note (Signed)
New. Symptoms c/w impingement and/or rotator cuff tendinopathy No gross deficits on exam and we jointly agreed initial conservative therapy. NSAIDs. Exercises given.

## 2016-08-01 NOTE — Progress Notes (Signed)
Subjective:    Patient ID: Allison SongAngela R Brennan, female    DOB: 1966/01/10, 51 y.o.   MRN: [id number]  CC: Allison Brennan is a 51 y.o. female who presents today for an acute visit.    HPI: CC: right shoulder pain 3 days x3 days ago when throwing out the trash. Constant, dull ache. Tried BC powder without relief. Worse when lift arms such as combing hair hurts.   Also states at end of visit, she continues to have clear runny nasal discharge. Tried mucinex without releif.  No fever, cough, chills, wheezing.         HISTORY:  Past Medical History:  Diagnosis Date  . Back pain, lumbosacral   . COPD (chronic obstructive pulmonary disease) (HCC)    no pulmonologist  . [id number](478.2Headache(784.0)   . Hypertension   . PPD positive, treated 1998   Past Surgical History:  Procedure Laterality Date  . CESAREAN SECTION     x2  . CLOSED REDUCTION FINGER WITH PERCUTANEOUS PINNING Left 06/17/2015   Procedure: CLOSED REDUCTION FINGER WITH PERCUTANEOUS PINNING little finger;  Surgeon: Kennedy BuckerMichael Menz, MD;  Location: [facility];  Service: Orthopedics;  Laterality: Left;  . EXOSTECTECTOMY TOE Right 02/12/2015   Procedure: EXOSTECTECTOMY TOE;  Surgeon: Linus Galasodd Cline, MD;  Location: [facility];  Service: Podiatry;  Laterality: Right;  . KNEE ARTHROSCOPY  03/13/2012   Procedure: ARTHROSCOPY KNEE;  Surgeon: Loanne DrillingFrank V Aluisio, MD;  Location: [facility];  Service: Orthopedics;  Laterality: Right;  debridementt right medial meniscus/chrondroplasty  . PARTIAL HYSTERECTOMY  1999   pelvic pain   Family History  Problem Relation Age of Onset  . Diverticulitis Mother   . Deep vein thrombosis Mother   . Arthritis Maternal Grandmother   . Osteoporosis Maternal Grandmother   . Cancer Maternal Aunt     brain  . Breast cancer Neg Hx     Allergies: Lisinopril Current Outpatient Prescriptions on File Prior to Visit  Medication Sig Dispense Refill  . albuterol (PROVENTIL) (2.5 MG/3ML) 0.083% nebulizer solution Take 3 mLs (2.5 mg  total) by nebulization every 6 (six) hours as needed for wheezing or shortness of breath. 150 mL 1  . amLODipine (NORVASC) 5 MG tablet Take 1 tablet (5 mg total) by mouth daily. 90 tablet 3  . budesonide-formoterol (SYMBICORT) 160-4.5 MCG/ACT inhaler Inhale 2 puffs into the lungs 2 (two) times daily. 1 Inhaler 3  . pravastatin (PRAVACHOL) 40 MG tablet Take 1 tablet (40 mg total) by mouth daily. 90 tablet 2  . PROAIR HFA 108 (90 Base) MCG/ACT inhaler   0   No current facility-administered medications on file prior to visit.     Social History  Substance Use Topics  . Smoking status: Current Every Day Smoker    Packs/day: 1.00    Years: 28.00  . Smokeless tobacco: Never Used     Comment: Decline Cessation information/SLS  . Alcohol use Yes     Comment: 1-2 drinks beer every day    Review of Systems  Constitutional: Negative for chills and fever.  HENT: Positive for rhinorrhea.   Respiratory: Negative for cough.   Cardiovascular: Negative for chest pain and palpitations.  Gastrointestinal: Negative for nausea and vomiting.  Musculoskeletal: Negative for back pain and joint swelling.      Objective:    BP (!) 142/78   Pulse 80   Temp 97.7 F (36.5 C) (Oral)   Ht 5\' 4"  (1.626 m)   Wt 181 lb 9.6 oz (82.4 kg)  SpO2 96%   BMI 31.17 kg/m    Physical Exam  Constitutional: She appears well-developed and well-nourished.  Eyes: Conjunctivae are normal.  Cardiovascular: Normal rate, regular rhythm, normal heart sounds and normal pulses.   Pulmonary/Chest: Effort normal and breath sounds normal. She has no wheezes. She has no rhonchi. She has no rales.  Musculoskeletal:       Right shoulder: She exhibits tenderness. She exhibits normal range of motion, no bony tenderness, no swelling, no pain, no spasm, normal pulse and normal strength.  Right Shoulder:   No asymmetry of shoulders when comparing right and left.No pain with palpation over glenohumeral joint lines, Barrow joint, AC  joint, or bicipital groove.  Pain with internal rotation.  pain with resisted lateral extension when overhead.  Negative active painful arc sign. Negative passive arc ( Neer's). Negative drop arm. No pain, swelling, or ecchymosis noted over long head of biceps. Bicep pain with resisted flexion and extension.   Strength and sensation normal BUE's.   Neurological: She is alert.  Skin: Skin is warm and dry.  Psychiatric: She has a normal mood and affect. Her speech is normal and behavior is normal. Thought content normal.  Vitals reviewed.      Assessment & Plan:   Problem List Items Addressed This Visit      Respiratory   Acute rhinitis    Trial of Flonase. Patient will let me know if she is not better.      Relevant Medications   fluticasone (FLONASE) 50 MCG/ACT nasal spray     Other   Acute pain of right shoulder - Primary    New. Symptoms c/w impingement and/or rotator cuff tendinopathy No gross deficits on exam and we jointly agreed initial conservative therapy. NSAIDs. Exercises given.       Relevant Medications   ibuprofen (ADVIL,MOTRIN) 600 MG tablet       I have discontinued Ms. Sobh guaiFENesin and chlorpheniramine-HYDROcodone. I am also having her start on fluticasone and ibuprofen. Additionally, I am having her maintain her PROAIR HFA, budesonide-formoterol, albuterol, amLODipine, and pravastatin.   Meds ordered this encounter  Medications  . fluticasone (FLONASE) 50 MCG/ACT nasal spray    Sig: Place 2 sprays into both nostrils daily.    Dispense:  16 g    Refill:  2    Order Specific Question:   Supervising Provider    Answer:   Darrick Huntsman, TERESA L [[id number]]  . ibuprofen (ADVIL,MOTRIN) 600 MG tablet    Sig: Take 1 tablet (600 mg total) by mouth every 8 (eight) hours as needed.    Dispense:  30 tablet    Refill:  0    Order Specific Question:   Supervising Provider    Answer:   Sherlene Shams [[id number]]    Return precautions given.   Risks, benefits, and  alternatives of the medications and treatment plan prescribed today were discussed, and patient expressed understanding.   Education regarding symptom management and diagnosis given to patient on AVS.  Continue to follow with Rennie Plowman, FNP for routine health maintenance.   Allison Brennan and I agreed with plan.   Rennie Plowman, FNP

## 2016-08-09 ENCOUNTER — Other Ambulatory Visit: Payer: Self-pay | Admitting: Family

## 2016-08-10 ENCOUNTER — Other Ambulatory Visit: Payer: Self-pay | Admitting: Family

## 2016-08-10 DIAGNOSIS — J449 Chronic obstructive pulmonary disease, unspecified: Secondary | ICD-10-CM

## 2016-08-10 NOTE — Progress Notes (Unsigned)
Melissa,  Decided to have pt evaluated by pulmonolgy and likely they will decide pfts  May d/c pft order  Thank you

## 2016-08-10 NOTE — Progress Notes (Signed)
Order d/c. Thanks, [employer] Corporationmelissa

## 2016-09-24 NOTE — Progress Notes (Signed)
[facility] [place] Pulmonary Medicine Consultation      Assessment and Plan:  The patient is a 51 year old smoker, who presents with chronic wheezing, dyspnea,, cough.  Chronic bronchitis/Asthma. --Will change symbicort to Trelegy inhaler once daily.  --PFT before next visit.   Coughing, dyspnea, wheezing.  --The symptoms are primarily due to chronic bronchitis/asthma as well as smoking. Explained that we can treat this with medications, but it not likely solve the underlying problem, she needs to stop smoking.   Nicotine Abuse.  --discussed the importance of smoking cessation. Spent > 3 mo in counseling. Her husband was on oxygen and died of lung cancer.  --She does not think that she is ready to quit.    Date: 09/24/2016  MRN# [id number] Allison Brennan [birth year]  Referring Physician: [facility].   Allison Brennan is a 51 y.o. old female seen in consultation for chief complaint of:    Chief Complaint  Patient presents with  . Advice Only    ref by [facility]: SOB at all times: Prod cough w/white mucus; wheezing    HPI:  She notes that for the past several years (>10) she has been having a lot of coughing and more recently has been wheezing. No particular times of year that make it worse. She is smoking 1 ppd she has never quit before, she has considered quitting but has never tried.  She has never been on inhalers, proair then changed to symbicort. She is on a nebulizer with albuterol which she uses once daily and feels that it helps.  She is using symbicort-160  2 puffs twice daily. She does not know if that helps.  She has no pets at home. 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  Her daughter smokes lives with her and smokes also. She coughs most of the day, worst at night. It wakes her from sleep.  She has taken a prescription cough syrup which she thinks helped.   She has been told that she snores loudly at night. She has never had  a sleep study, she notes th at she is very tired during the day. She goes to bed at 9-10 pm and wakes at 6 am, wakes feeling tired. On weekends she wakes at same time.   Reviewed images CXR 07/05/16; unremarkable, possible mild increased interstitial markings.  CBC 04/20/16; Abs Eos= 340.   PMHX:   Past Medical History:  Diagnosis Date  . Back pain, lumbosacral   . COPD (chronic obstructive pulmonary disease) (HCC)    no pulmonologist  . YQIHKVQQ(595.6)   . Hypertension   . PPD positive, treated 1998   Surgical Hx:  Past Surgical History:  Procedure Laterality Date  . CESAREAN SECTION     x2  . CLOSED REDUCTION FINGER WITH PERCUTANEOUS PINNING Left 06/17/2015   Procedure: CLOSED REDUCTION FINGER WITH PERCUTANEOUS PINNING little finger;  Surgeon: Kennedy Bucker, MD;  Location: [facility];  Service: Orthopedics;  Laterality: Left;  . EXOSTECTECTOMY TOE Right 02/12/2015   Procedure: EXOSTECTECTOMY TOE;  Surgeon: Linus Galas, MD;  Location: [facility];  Service: Podiatry;  Laterality: Right;  . KNEE ARTHROSCOPY  03/13/2012   Procedure: ARTHROSCOPY KNEE;  Surgeon: Loanne Drilling, MD;  Location: [facility];  Service: Orthopedics;  Laterality: Right;  debridementt right medial meniscus/chrondroplasty  . PARTIAL HYSTERECTOMY  1999   pelvic pain   Family Hx:  Family History  Problem Relation Age of Onset  . Diverticulitis  Mother   . Deep vein thrombosis Mother   . Arthritis Maternal Grandmother   . Osteoporosis Maternal Grandmother   . Cancer Maternal Aunt     brain  . Breast cancer Neg Hx    Social Hx:   Social History  Substance Use Topics  . Smoking status: Current Every Day Smoker    Packs/day: 1.00    Years: 28.00  . Smokeless tobacco: Never Used     Comment: Decline Cessation information/SLS  . Alcohol use Yes     Comment: 1-2 drinks beer every day   Medication:   Reviewed.    Allergies:  Lisinopril  Review of Systems: Gen:  Denies  fever, sweats, chills HEENT: Denies  blurred vision, double vision.  Cvc:  No dizziness, chest pain. Resp:   Denies cough or sputum production, shortness of breath Gi: Denies swallowing difficulty, stomach pain. Gu:  Denies bladder incontinence, burning urine Ext:   No Joint pain, stiffness. Skin: No skin rash,  hives  Endoc:  No polyuria, polydipsia. Psych: No depression, insomnia. Other:  All other systems were reviewed with the patient and were negative other that what is mentioned in the HPI.   Physical Examination:   VS: BP 140/88 (BP Location: Left Arm, Cuff Size: Normal)   Pulse 86   Wt 183 lb (83 kg)   SpO2 96%   BMI 31.41 kg/m   General Appearance: No distress  Neuro:without focal findings,  speech normal,  HEENT: PERRLA, EOM intact.   Pulmonary: normal breath sounds, No wheezing.  CardiovascularNormal S1,S2.  No m/r/g.   Abdomen: Benign, Soft, non-tender. Renal:  No costovertebral tenderness  GU:  No performed at this time. Endoc: No evident thyromegaly, no signs of acromegaly. Skin:   warm, no rashes, no ecchymosis  Extremities: normal, no cyanosis, clubbing.  Other findings:    LABORATORY PANEL:   CBC No results for input(s): WBC, HGB, HCT, PLT in the last 168 hours. ------------------------------------------------------------------------------------------------------------------  Chemistries  No results for input(s): NA, K, CL, CO2, GLUCOSE, BUN, CREATININE, CALCIUM, MG, AST, ALT, ALKPHOS, BILITOT in the last 168 hours.  Invalid input(s): GFRCGP ------------------------------------------------------------------------------------------------------------------  Cardiac Enzymes No results for input(s): TROPONINI in the last 168 hours. ------------------------------------------------------------  RADIOLOGY:  No results found.     Thank  you for the consultation and for allowing [facility] to assist in the care of your patient. Our recommendations are noted  above.  Please contact us if we can be of further service.   Wells Guileseep Carlita Whitcomb, MD.  Board Certified in Internal Medicine, Pulmonary Medicine, Critical Care Medicine, and Sleep Medicine.  [facility] Office Number: [phone]  Santiago Gladavid Kasa, M.D.  Billy Fischeravid Simonds, M.D  09/24/2016

## 2016-09-25 ENCOUNTER — Encounter: Payer: Self-pay | Admitting: Internal Medicine

## 2016-09-25 ENCOUNTER — Ambulatory Visit (INDEPENDENT_AMBULATORY_CARE_PROVIDER_SITE_OTHER): Payer: BLUE CROSS/BLUE SHIELD | Admitting: Internal Medicine

## 2016-09-25 VITALS — BP 140/88 | HR 86 | Wt 183.0 lb

## 2016-09-25 DIAGNOSIS — J440 Chronic obstructive pulmonary disease with acute lower respiratory infection: Secondary | ICD-10-CM

## 2016-09-25 DIAGNOSIS — F17210 Nicotine dependence, cigarettes, uncomplicated: Secondary | ICD-10-CM | POA: Diagnosis not present

## 2016-09-25 DIAGNOSIS — J4540 Moderate persistent asthma, uncomplicated: Secondary | ICD-10-CM

## 2016-09-25 MED ORDER — BENZONATATE 200 MG PO CAPS: 200.0000 mg | ORAL_CAPSULE | Freq: Three times a day (TID) | ORAL | 0 refills | Status: DC | PRN

## 2016-09-25 MED ORDER — FLUTICASONE-UMECLIDIN-VILANT 100-62.5-25 MCG/INH IN AEPB: 1.0000 | INHALATION_SPRAY | Freq: Every day | RESPIRATORY_TRACT | 11 refills | Status: DC

## 2016-09-25 NOTE — Patient Instructions (Addendum)
--  stop symbicort, start Trelegy inhaler once daily.   --Use nebulizer as needed.   --Full lung function test before next visit.   --Will start tessalon (benzonatate) pills  200 mg three times daily.   --Quitting smoking is the most important thing that you can do for your health.  --Quitting smoking will have greater affect on your health than any medicine that we can give you.

## 2016-09-25 NOTE — Addendum Note (Signed)
Addended by: Meyer CoryAHMAD, MISTY R on: 09/25/2016 10:52 AM   Modules accepted: Orders

## 2016-10-15 ENCOUNTER — Emergency Department
Admission: EM | Admit: 2016-10-15 | Discharge: 2016-10-15 | Disposition: A | Discharge status: Home / Self Care | Payer: BLUE CROSS/BLUE SHIELD | Attending: Emergency Medicine | Admitting: Emergency Medicine

## 2016-10-15 DIAGNOSIS — J449 Chronic obstructive pulmonary disease, unspecified: Secondary | ICD-10-CM | POA: Diagnosis not present

## 2016-10-15 DIAGNOSIS — I10 Essential (primary) hypertension: Secondary | ICD-10-CM | POA: Insufficient documentation

## 2016-10-15 DIAGNOSIS — Z79899 Other long term (current) drug therapy: Secondary | ICD-10-CM | POA: Diagnosis not present

## 2016-10-15 DIAGNOSIS — M546 Pain in thoracic spine: Secondary | ICD-10-CM | POA: Insufficient documentation

## 2016-10-15 DIAGNOSIS — F17200 Nicotine dependence, unspecified, uncomplicated: Secondary | ICD-10-CM | POA: Insufficient documentation

## 2016-10-15 MED ORDER — NAPROXEN 500 MG PO TABS: 500.0000 mg | ORAL_TABLET | Freq: Two times a day (BID) | ORAL | 0 refills | Status: DC

## 2016-10-15 MED ORDER — METHOCARBAMOL 500 MG PO TABS: 500.0000 mg | ORAL_TABLET | Freq: Four times a day (QID) | ORAL | 0 refills | Status: DC

## 2016-10-15 MED ORDER — OMEPRAZOLE 10 MG PO CPDR: 10.0000 mg | DELAYED_RELEASE_CAPSULE | Freq: Every day | ORAL | 0 refills | Status: DC

## 2016-10-15 NOTE — ED Notes (Signed)
FIRST NURSE NOTE: C/o low back and side pain worse over the past few days.

## 2016-10-15 NOTE — ED Triage Notes (Signed)
Pt to ED with c/o of lower back pain that she states started yesterday morning around 2:00. Pt states it is a dull pain that increases when she "burps". Pt ambulatory to triage.
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
## 2016-12-21 ENCOUNTER — Ambulatory Visit: Payer: BLUE CROSS/BLUE SHIELD | Attending: Internal Medicine

## 2016-12-21 DIAGNOSIS — J4540 Moderate persistent asthma, uncomplicated: Secondary | ICD-10-CM | POA: Insufficient documentation

## 2016-12-21 MED ORDER — ALBUTEROL SULFATE (2.5 MG/3ML) 0.083% IN NEBU
2.5000 mg | INHALATION_SOLUTION | Freq: Once | RESPIRATORY_TRACT | Status: AC
  Administered 2016-12-21: 2.5 mg via RESPIRATORY_TRACT
  Filled 2016-12-21: qty 3

## 2016-12-25 NOTE — Progress Notes (Signed)
[facility] Medicine Consultation      Assessment and Plan:  The patient is a 51 year old smoker, who presents with chronic wheezing, dyspnea, cough.  Chronic bronchitis/Asthma. --ContinueTrelegy inhaler once daily.    Coughing, dyspnea, wheezing.  --The symptoms are primarily due to chronic bronchitis/asthma as well as smoking. Explained that we can treat this with medications, but it not likely solve the underlying problem, she needs to stop smoking.   Nicotine Abuse.  --discussed the importance of smoking cessation. Spent > 3 min in counseling. Her husband was on oxygen and died of lung cancer.  --She does not think that she is ready to quit.    Date: 12/25/2016  MRN# [id number] Allison Brennan [birth year]  Referring Physician: [facility].   Allison Brennan is a 51 y.o. old female seen in consultation for chief complaint of:    Chief Complaint  Patient presents with  . Shortness of Breath    f/u PFT  . Cough    pt still has clear mucus    HPI:  The patient is a 51 yo female smoker, at last visit she has continued withTrilogy inhaler. She has continued to cough, she has continued to cough. She has continued to smoke ppd.  She has taken a prescription cough syrup which she thinks helped. She does notice that she is breathing better with the inhaler than before.   She has been told that she snores loudly at night. She has never had a sleep study, she notes th at she is very tired during the day. She goes to bed at 9-10 pm and wakes at 6 am, wakes feeling tired. On weekends she wakes at same time. She is no longer using proair as rescue, she ran out.   CXR 07/05/16; unremarkable, possible mild increased interstitial markings.  CBC 04/20/16; Abs Eos= 340.   EKG tracings reviewed 12/21/16; ratio equals 71%, FEV1 = 50%, there is no change with bronchodilator. TLC = 3%, DLCO = 99%. Flow volume loop shows obstructive pattern. Overall tests, suggestive of mild to  moderate objective lung disease.  Medication:   Reviewed.    Allergies:  Lisinopril  Review of Systems: Gen:  Denies  fever, sweats, chills HEENT: Denies blurred vision, double vision.  Cvc:  No dizziness, chest pain. Resp:   Denies cough or sputum production, shortness of breath Gi: Denies swallowing difficulty, stomach pain. Gu:  Denies bladder incontinence, burning urine Ext:   No Joint pain, stiffness. Skin: No skin rash,  hives  Endoc:  No polyuria, polydipsia. Psych: No depression, insomnia. Other:  All other systems were reviewed with the patient and were negative other that what is mentioned in the HPI.   Physical Examination:   VS: BP 120/86 (BP Location: Left Arm, Cuff Size: Large)   Pulse 74   Resp 16   Ht 5\' 4"  (1.626 m)   Wt 185 lb 9.6 oz (84.2 kg)   SpO2 98%   BMI 31.86 kg/m   General Appearance: No distress  Neuro:without focal findings,  speech normal,  HEENT: PERRLA, EOM intact.   Pulmonary: normal breath sounds, No wheezing.  CardiovascularNormal S1,S2.  No m/r/g.   Abdomen: Benign, Soft, non-tender. Renal:  No costovertebral tenderness  GU:  No performed at this time. Endoc: No evident thyromegaly, no signs of acromegaly. Skin:   warm, no rashes, no ecchymosis  Extremities: normal, no cyanosis, clubbing.  Other findings:    LABORATORY PANEL:   CBC No results for input(s):  WBC, HGB, HCT, PLT in the last 168 hours. ------------------------------------------------------------------------------------------------------------------  Chemistries  No results for input(s): NA, K, CL, CO2, GLUCOSE, BUN, CREATININE, CALCIUM, MG, AST, ALT, ALKPHOS, BILITOT in the last 168 hours.  Invalid input(s): GFRCGP ------------------------------------------------------------------------------------------------------------------  Cardiac Enzymes No results for input(s): TROPONINI in the last 168  hours. ------------------------------------------------------------  RADIOLOGY:  No results found.     Thank  you for the consultation and for allowing [facility], Critical Care to assist in the care of your patient. Our recommendations are noted above.  Please contact us if we can be of further service.   Wells Guiles, MD.  Board Certified in Internal Medicine, Pulmonary Medicine, Critical Care Medicine, and Sleep Medicine.  [place] [facility] Office Number: [phone]  Santiago Glad, M.D.  Billy Fischer, M.D  12/25/2016

## 2016-12-26 ENCOUNTER — Encounter: Payer: Self-pay | Admitting: *Deleted

## 2016-12-26 ENCOUNTER — Ambulatory Visit (INDEPENDENT_AMBULATORY_CARE_PROVIDER_SITE_OTHER): Payer: BLUE CROSS/BLUE SHIELD | Admitting: Internal Medicine

## 2016-12-26 VITALS — BP 120/86 | HR 74 | Resp 16 | Ht 64.0 in | Wt 185.6 lb

## 2016-12-26 DIAGNOSIS — J4540 Moderate persistent asthma, uncomplicated: Secondary | ICD-10-CM

## 2016-12-26 DIAGNOSIS — F17210 Nicotine dependence, cigarettes, uncomplicated: Secondary | ICD-10-CM

## 2016-12-26 MED ORDER — ALBUTEROL SULFATE HFA 108 (90 BASE) MCG/ACT IN AERS: 2.0000 | INHALATION_SPRAY | RESPIRATORY_TRACT | 11 refills | Status: DC | PRN

## 2016-12-26 NOTE — Patient Instructions (Signed)
--  Quitting smoking is the most important thing that you can do for your health.  --Quitting smoking will have greater affect on your health than any medicine that we can give you.    

## 2017-01-08 ENCOUNTER — Encounter: Payer: Self-pay | Admitting: *Deleted

## 2017-01-08 ENCOUNTER — Telehealth: Payer: Self-pay | Admitting: Family

## 2017-01-08 NOTE — Telephone Encounter (Signed)
Call pt  Reviewed note from pulmonology  She states she snores - would she like sleep study for sleep study?  I will order if she says yes

## 2017-01-08 NOTE — Telephone Encounter (Signed)
Left voicemail to check mychart

## 2017-01-09 NOTE — Telephone Encounter (Signed)
Pt declined sleep study

## 2017-02-12 DIAGNOSIS — M19079 Primary osteoarthritis, unspecified ankle and foot: Secondary | ICD-10-CM | POA: Diagnosis not present

## 2017-02-12 DIAGNOSIS — M79671 Pain in right foot: Secondary | ICD-10-CM | POA: Diagnosis not present

## 2017-04-05 ENCOUNTER — Other Ambulatory Visit: Payer: Self-pay | Admitting: Family

## 2017-04-05 DIAGNOSIS — E785 Hyperlipidemia, unspecified: Secondary | ICD-10-CM

## 2017-04-19 ENCOUNTER — Other Ambulatory Visit: Payer: Self-pay | Admitting: Family

## 2017-04-19 DIAGNOSIS — E785 Hyperlipidemia, unspecified: Secondary | ICD-10-CM

## 2017-07-04 ENCOUNTER — Other Ambulatory Visit: Payer: Self-pay | Admitting: Family

## 2017-07-04 DIAGNOSIS — I10 Essential (primary) hypertension: Secondary | ICD-10-CM

## 2017-08-06 NOTE — Progress Notes (Signed)
[facility] Pulmonary Medicine Consultation      Assessment and Plan:  The patient is a 52 year old smoker, who presents with chronic wheezing, dyspnea, cough.  Chronic bronchitis/Asthma. -Currently not well controlled due to noncompliance with medications and continued smoking. --ContinueTrelegy inhaler once daily.  I have reordered the medication.  Coughing, dyspnea, wheezing.  --The symptoms are primarily due to chronic bronchitis/asthma as well as smoking. Explained that we can treat this with medications, but it not likely solve the underlying problem, she needs to stop smoking.   Nicotine Abuse.  --discussed the importance of smoking cessation. Spent > 3 min in counseling. Her husband was on oxygen and died of lung cancer.  --She does  think that she is ready to quit, she was prescribed nicotine patches, and we discussed setting a quit date.  Excessive daytime sleepiness. -Symptoms and signs of obstructive sleep apnea. - We will send for sleep study.  Orders Placed This Encounter  Procedures  . Home sleep test   Meds ordered this encounter  Medications  . nicotine (NICOTINE STEP 1) 21 mg/24hr patch    Sig: Place 1 patch (21 mg total) onto the skin daily.    Dispense:  28 patch    Refill:  0  . Fluticasone-Umeclidin-Vilant (TRELEGY ELLIPTA) 100-62.5-25 MCG/INH AEPB    Sig: Inhale 1 applicator into the lungs daily. Rinse mouth after use.    Dispense:  1 each    Refill:  10   Return in about 3 months (around 11/04/2017).    Date: 08/06/2017  MRN# [id number] Allison Brennan 11/06/1965  Referring Physician: [facility] primary [facility].   Allison Brennan is a 52 y.o. old female seen in consultation for chief complaint of:    Chief Complaint  Patient presents with  . Asthma    pt c/o chest pain since 5:30  . Wheezing    2 weeks  . Cough    white mucus x 2 weeks    HPI:  The patient is a 52 yo female smoker with asthma and chronic bronchitis,, at last visit she has  asked to continue with trilogy inhaler. She feels that her breathing has been doing worse, she has not refilled her meds, and now she is wheezing.  She is smoking 1 ppd, and she is considering quitting.   She continues to cough occasionally.   She has been told that she snores loudly at night. She has never had a sleep study, she notes th at she is very tired during the day. She goes to bed at 9-10 pm and wakes at 6 am, wakes feeling tired. On weekends she wakes at same time. She is no longer using proair as rescue, she ran out.   CXR 07/05/16; unremarkable, possible mild increased interstitial markings.  CBC 04/20/16; Abs Eos= 340.   EKG tracings reviewed 12/21/16; ratio equals 71%, FEV1 = 50%, there is no change with bronchodilator. TLC = 3%, DLCO = 99%. Flow volume loop shows obstructive pattern. Overall tests, suggestive of mild to moderate objective lung disease.  Medication:   Reviewed.    Allergies:  Lisinopril  Review of Systems: Gen:  Denies  fever, sweats, chills HEENT: Denies blurred vision, double vision.  Cvc:  No dizziness, chest pain. Resp:   Denies cough or sputum production, shortness of breath Gi: Denies swallowing difficulty, stomach pain. Gu:  Denies bladder incontinence, burning urine Ext:   No Joint pain, stiffness. Skin: No skin rash,  hives  Endoc:  No polyuria, polydipsia.  Psych: No depression, insomnia. Other:  All other systems were reviewed with the patient and were negative other that what is mentioned in the HPI.   Physical Examination:   VS: BP (!) 170/118 (BP Location: Left Arm, Cuff Size: Large)   Pulse 66   Resp 16   Ht 5\' 4"  (1.626 m)   Wt 184 lb (83.5 kg)   SpO2 95%   BMI 31.58 kg/m   General Appearance: No distress  Neuro:without focal findings,  speech normal,  HEENT: PERRLA, EOM intact.  Mallampati 3 Pulmonary: normal breath sounds, scattered bilateral wheezing. CardiovascularNormal S1,S2.  No m/r/g.   Abdomen: Benign, Soft,  non-tender. Renal:  No costovertebral tenderness  GU:  No performed at this time. Endoc: No evident thyromegaly, no signs of acromegaly. Skin:   warm, no rashes, no ecchymosis  Extremities: normal, no cyanosis, clubbing.  Other findings:    LABORATORY PANEL:   CBC No results for input(s): WBC, HGB, HCT, PLT in the last 168 hours. ------------------------------------------------------------------------------------------------------------------  Chemistries  No results for input(s): NA, K, CL, CO2, GLUCOSE, BUN, CREATININE, CALCIUM, MG, AST, ALT, ALKPHOS, BILITOT in the last 168 hours.  Invalid input(s): GFRCGP ------------------------------------------------------------------------------------------------------------------  Cardiac Enzymes No results for input(s): TROPONINI in the last 168 hours. ------------------------------------------------------------  RADIOLOGY:  No results found.     Thank  you for the consultation and for allowing [facility] to assist in the care of your patient. Our recommendations are noted above.  Please contact us if we can be of further service.   Wells Guileseep Whittany Parish, MD.  Board Certified in Internal Medicine, Pulmonary Medicine, Critical Care Medicine, and Sleep Medicine.  [place] [facility] Office Number: [phone]  Santiago Gladavid Kasa, M.D.  Billy Fischeravid Simonds, M.D

## 2017-08-07 ENCOUNTER — Other Ambulatory Visit: Payer: Self-pay | Admitting: Family

## 2017-08-07 ENCOUNTER — Telehealth: Payer: Self-pay | Admitting: Family

## 2017-08-07 ENCOUNTER — Encounter: Payer: Self-pay | Admitting: Internal Medicine

## 2017-08-07 ENCOUNTER — Ambulatory Visit (INDEPENDENT_AMBULATORY_CARE_PROVIDER_SITE_OTHER): Payer: BLUE CROSS/BLUE SHIELD | Admitting: Internal Medicine

## 2017-08-07 VITALS — BP 170/118 | HR 66 | Resp 16 | Ht 64.0 in | Wt 184.0 lb

## 2017-08-07 DIAGNOSIS — I10 Essential (primary) hypertension: Secondary | ICD-10-CM

## 2017-08-07 DIAGNOSIS — J4540 Moderate persistent asthma, uncomplicated: Secondary | ICD-10-CM

## 2017-08-07 DIAGNOSIS — J440 Chronic obstructive pulmonary disease with acute lower respiratory infection: Secondary | ICD-10-CM | POA: Diagnosis not present

## 2017-08-07 MED ORDER — FLUTICASONE-UMECLIDIN-VILANT 100-62.5-25 MCG/INH IN AEPB: 1.0000 | INHALATION_SPRAY | Freq: Every day | RESPIRATORY_TRACT | 11 refills | Status: DC

## 2017-08-07 MED ORDER — NICOTINE 21 MG/24HR TD PT24: 21.0000 mg | MEDICATED_PATCH | Freq: Every day | TRANSDERMAL | 0 refills | Status: DC

## 2017-08-07 MED ORDER — FLUTICASONE-UMECLIDIN-VILANT 100-62.5-25 MCG/INH IN AEPB: 1.0000 | INHALATION_SPRAY | Freq: Every day | RESPIRATORY_TRACT | 10 refills | Status: DC

## 2017-08-07 NOTE — Patient Instructions (Addendum)
Use Trelegy inhaler daily.  Will send for sleep study.   --Quitting smoking is the most important thing that you can do for your health.  --Quitting smoking will have greater affect on your health than any medicine that we can give you.   --The best way to quit is to set a quit date, usually a day that has meaning like someone's birthday.  --Start any medication prescribed for quitting one week before you quit date. Then toss out the cigarettes on your quit date.  --If you start smoking again, start from scratch--set another quit day and try again!

## 2017-08-07 NOTE — Telephone Encounter (Signed)
Pt states the pharmacy states that the pt has no refills, contact the pharmacy to resolve, pt cannot get any refills

## 2017-08-07 NOTE — Addendum Note (Signed)
Addended by: Janean SarkSNIPES, SONYA K on: 08/07/2017 11:51 AM   Modules accepted: Orders

## 2017-08-07 NOTE — Telephone Encounter (Signed)
Patient called [phone], left VM [facility] 90 tabs/2 refills and Amlodipine sent on 07/04/17 90 tabs/2 refills. Patient told to call back if she has any problems with the medications being filled.

## 2017-08-07 NOTE — Telephone Encounter (Signed)
Copied from CRM [id number]. Topic: Quick Communication - Rx Refill/Question >> Aug 07, 2017  3:03 PM Oneal GroutSebastian, Jennifer S wrote: Medication:  pravastatin (PRAVACHOL) 40 MG tablet and amLODipine (NORVASC) 5 MG tablet    Has the patient contacted their pharmacy? Yes.     (Agent: If no, request that the patient contact the pharmacy for the refill.)   Preferred Pharmacy (with phone number or street name): [facility] [address]   Agent: Please be advised that RX refills may take up to 3 business days. We ask that you follow-up with your pharmacy.

## 2017-08-08 NOTE — Telephone Encounter (Signed)
Please advise 

## 2017-08-08 NOTE — Telephone Encounter (Signed)
Pt returned call,  She is at work, best time to get her will be at lunch  11am - 1130 am

## 2017-08-08 NOTE — Telephone Encounter (Signed)
Tried to reach patient by phone left message to call office. 

## 2017-08-10 ENCOUNTER — Telehealth: Payer: Self-pay | Admitting: Family

## 2017-08-10 MED ORDER — AMLODIPINE BESYLATE 5 MG PO TABS: 5.0000 mg | ORAL_TABLET | Freq: Every day | ORAL | 0 refills | Status: DC

## 2017-08-10 NOTE — Telephone Encounter (Signed)
Pt calling about her medication refill  - please call -

## 2017-08-10 NOTE — Addendum Note (Signed)
Addended by: Sherlene ShamsULLO, Lindzey Zent L on: 08/10/2017 02:59 PM   Modules accepted: Orders

## 2017-08-10 NOTE — Telephone Encounter (Addendum)
Pt  States  Has  Had medication refill  Concerns   Several  Encounters recently  Regarding  The  Issue.   3  Way  Conversation    With   [employer]ristin    From  [employer]  Office      Initiated   To  Address  The  Concern

## 2017-08-10 NOTE — Telephone Encounter (Addendum)
Spoke to patient she has been out of medication since October BP has been running high 160/100 due she has been out of medication . Appointment scheduled for 08/29/17 with Claris CheMargaret.  Can we refill until 30 days ?  Last office visit 08/01/16 acute Claris CheMargaret

## 2017-08-10 NOTE — Telephone Encounter (Addendum)
Yes, I  refilled the amlodiine

## 2017-08-13 ENCOUNTER — Ambulatory Visit: Payer: BLUE CROSS/BLUE SHIELD | Admitting: Internal Medicine

## 2017-08-13 ENCOUNTER — Encounter: Payer: Self-pay | Admitting: *Deleted

## 2017-08-13 ENCOUNTER — Telehealth: Payer: Self-pay | Admitting: Family

## 2017-08-13 ENCOUNTER — Encounter: Payer: Self-pay | Admitting: Internal Medicine

## 2017-08-13 VITALS — BP 146/100 | HR 74 | Temp 97.7°F | Resp 16 | Wt 187.5 lb

## 2017-08-13 DIAGNOSIS — I10 Essential (primary) hypertension: Secondary | ICD-10-CM | POA: Diagnosis not present

## 2017-08-13 DIAGNOSIS — J069 Acute upper respiratory infection, unspecified: Secondary | ICD-10-CM

## 2017-08-13 DIAGNOSIS — J029 Acute pharyngitis, unspecified: Secondary | ICD-10-CM

## 2017-08-13 DIAGNOSIS — B9789 Other viral agents as the cause of diseases classified elsewhere: Secondary | ICD-10-CM | POA: Diagnosis not present

## 2017-08-13 LAB — POC INFLUENZA A&B (BINAX/QUICKVUE)
Influenza A, POC: NEGATIVE
Influenza B, POC: NEGATIVE

## 2017-08-13 MED ORDER — METHYLPREDNISOLONE ACETATE 80 MG/ML IJ SUSP
80.0000 mg | Freq: Once | INTRAMUSCULAR | Status: AC
  Administered 2017-08-13: 80 mg via INTRAMUSCULAR

## 2017-08-13 MED ORDER — AMLODIPINE BESYLATE 5 MG PO TABS: 5.0000 mg | ORAL_TABLET | Freq: Every day | ORAL | 0 refills | Status: DC

## 2017-08-13 NOTE — Addendum Note (Signed)
Addended by: Smitty KnudsenSUTHERLAND, Kawanna Christley K on: 08/13/2017 04:39 PM   Modules accepted: Orders

## 2017-08-13 NOTE — Addendum Note (Signed)
Addended by: Smitty KnudsenSUTHERLAND, Alyaan Budzynski K on: 08/13/2017 04:31 PM   Modules accepted: Orders

## 2017-08-13 NOTE — Progress Notes (Signed)
HPI  Pt presents to the clinic today with c/o headache, nasal congestion, sore throat and cough. This started 3 days ago. She describes the headache as pressure. She is blowing yellow mucous out of her nose. She denies difficulty swallowing. The cough his productive of yellow mucous. She has associated wheezing but denies shortness of breath. She denies fever, but has had chills and body aches. She has taken [facility] without any relief. She does have COPD, and continues to smoke. She has not had sick contacts. She does not take her flu test.  She would also like a refill of her blood pressure medication. She has been out for weeks. Her BP today is 146/100.  Review of Systems      Past Medical History:  Diagnosis Date  . Back pain, lumbosacral   . COPD (chronic obstructive pulmonary disease) (HCC)    no pulmonologist  . VWUJWJXB(147.8Headache(784.0)   . Hypertension   . PPD positive, treated 1998    Family History  Problem Relation Age of Onset  . Diverticulitis Mother   . Deep vein thrombosis Mother   . Arthritis Maternal Grandmother   . Osteoporosis Maternal Grandmother   . Cancer Maternal Aunt        brain  . Breast cancer Neg Hx     Social History   Socioeconomic History  . Marital status: Widowed    Spouse name: Not on file  . Number of children: 2  . Years of education: Not on file  . Highest education level: Not on file  Social Needs  . Financial resource strain: Not on file  . Food insecurity - worry: Not on file  . Food insecurity - inability: Not on file  . Transportation needs - medical: Not on file  . Transportation needs - non-medical: Not on file  Occupational History  . 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 Smoking status: Current Every Day Smoker    Packs/day: 1.00    Years: 28.00    Pack years: 28.00  . Smokeless tobacco: Never Used  . Tobacco comment: Decline Cessation information/SLS  Substance and Sexual Activity  . Alcohol use:  Yes    Comment: 1-2 drinks beer every day  . Drug use: No  . Sexual activity: Not Currently  Other Topics Concern  . Not on file  Social History Narrative   [place], [place]. Both children living with her.       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       Regular Exercise -  NO   Daily Caffeine Use:  6 - 8 sodas   Diet-    Allergies  Allergen Reactions  . Lisinopril Cough     Constitutional: Positive headache, fatigue. Denies fever or abrupt weight changes.  HEENT:  Positive nasal congestion, sore throat. Denies eye redness, eye pain, pressure behind the eyes, facial pain, nasal congestion, ear pain, ringing in the ears, wax buildup, runny nose or bloody nose. Respiratory: Positive cough. Denies difficulty breathing or shortness of breath.  Cardiovascular: Denies chest pain, chest tightness, palpitations or swelling in the hands or feet.   No other specific complaints in a complete review of systems (except as listed in HPI above).  Objective:   BP (!) 146/100 (BP Location: Left Arm, Patient Position: Sitting, Cuff Size: Large)   Pulse 74   Temp 97.7 F (36.5 C) (Oral)   Resp 16   Wt 187 lb  8 oz (85 kg)   SpO2 96%   BMI 32.18 kg/m  Wt Readings from Last 3 Encounters:  08/13/17 187 lb 8 oz (85 kg)  08/07/17 184 lb (83.5 kg)  12/26/16 185 lb 9.6 oz (84.2 kg)     General: Appears her stated age, in NAD. HEENT: Head: normal shape and size, no sinus tenderness noted; Ears: Tm's gray and intact, normal light reflex; Nose: mucosa pink and moist, septum midline; Throat/Mouth: + PND. Teeth present, mucosa erythematous and moist, no exudate noted, no lesions or ulcerations noted.  Neck: No cervical lymphadenopathy.  Heart: RRR, no murmur, rubs or gallops noted. Pulmonary/Chest: Normal effort and positive vesicular breath sounds. No respiratory distress. No wheezes, rales or ronchi noted.       Assessment & Plan:   Viral Upper Respiratory Infection with  Cough:  Rapid Flu: negative 80 mg Depo IM today Get some rest and drink plenty of water Do salt water gargles for the sore throat Start Flonase and Zyrtec OTC Delsym as needed for cough  HTN:   Uncontrolled Refilled Amlodipine 5 mg daily Discussed DASH diet and exercise for weight loss  RTC in 3 weeks for follow up HTN   Larna Capelle, NP

## 2017-08-13 NOTE — Patient Instructions (Signed)

## 2017-08-13 NOTE — Telephone Encounter (Signed)
Pt is requesting to transfer care to [facility]. Is this ok?

## 2017-08-13 NOTE — Addendum Note (Signed)
Addended by: Lorre MunroeBAITY, REGINA W on: 08/13/2017 04:34 PM   Modules accepted: Orders

## 2017-08-13 NOTE — Telephone Encounter (Signed)
okay

## 2017-08-14 NOTE — Telephone Encounter (Signed)
Fine with me. Will need establish care appt

## 2017-08-29 ENCOUNTER — Ambulatory Visit: Payer: Self-pay | Admitting: Family

## 2017-09-01 ENCOUNTER — Encounter: Payer: Self-pay | Admitting: Emergency Medicine

## 2017-09-01 ENCOUNTER — Emergency Department
Admission: EM | Admit: 2017-09-01 | Discharge: 2017-09-01 | Disposition: A | Discharge status: Home / Self Care | Payer: BLUE CROSS/BLUE SHIELD | Attending: Emergency Medicine | Admitting: Emergency Medicine

## 2017-09-01 ENCOUNTER — Other Ambulatory Visit: Payer: Self-pay

## 2017-09-01 DIAGNOSIS — J449 Chronic obstructive pulmonary disease, unspecified: Secondary | ICD-10-CM | POA: Diagnosis not present

## 2017-09-01 DIAGNOSIS — M62838 Other muscle spasm: Secondary | ICD-10-CM | POA: Insufficient documentation

## 2017-09-01 DIAGNOSIS — F17210 Nicotine dependence, cigarettes, uncomplicated: Secondary | ICD-10-CM | POA: Insufficient documentation

## 2017-09-01 DIAGNOSIS — M62830 Muscle spasm of back: Secondary | ICD-10-CM | POA: Diagnosis not present

## 2017-09-01 DIAGNOSIS — Z79899 Other long term (current) drug therapy: Secondary | ICD-10-CM | POA: Insufficient documentation

## 2017-09-01 DIAGNOSIS — I10 Essential (primary) hypertension: Secondary | ICD-10-CM | POA: Diagnosis not present

## 2017-09-01 DIAGNOSIS — M7918 Myalgia, other site: Secondary | ICD-10-CM | POA: Diagnosis present

## 2017-09-01 LAB — URINALYSIS, COMPLETE (UACMP) WITH MICROSCOPIC
Bacteria, UA: NONE SEEN
Bilirubin Urine: NEGATIVE
Glucose, UA: NEGATIVE mg/dL
Hgb urine dipstick: NEGATIVE
Ketones, ur: NEGATIVE mg/dL
Leukocytes, UA: NEGATIVE
Nitrite: NEGATIVE
Protein, ur: NEGATIVE mg/dL
RBC / HPF: NONE SEEN RBC/hpf (ref 0–5)
Specific Gravity, Urine: 1.003 — ABNORMAL LOW (ref 1.005–1.030)
pH: 7 (ref 5.0–8.0)

## 2017-09-01 MED ORDER — MELOXICAM 15 MG PO TABS: 15.0000 mg | ORAL_TABLET | Freq: Every day | ORAL | 1 refills | Status: AC

## 2017-09-01 MED ORDER — ORPHENADRINE CITRATE ER 100 MG PO TB12: 100.0000 mg | ORAL_TABLET | Freq: Two times a day (BID) | ORAL | 1 refills | Status: AC

## 2017-09-01 MED ORDER — KETOROLAC TROMETHAMINE 30 MG/ML IJ SOLN
30.0000 mg | Freq: Once | INTRAMUSCULAR | Status: AC
  Administered 2017-09-01: 30 mg via INTRAMUSCULAR
  Filled 2017-09-01: qty 1
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
## 2017-09-01 NOTE — ED Triage Notes (Signed)
States neck and back pain x 2 days. Denies injury.

## 2017-09-01 NOTE — ED Notes (Signed)
Pt reports having neck and back pain for past 2 days with no injury.  Pt is A&Ox4, in NAD.  Ambulatory from triage.

## 2017-09-01 NOTE — ED Notes (Signed)
Pt discharged to home.  Family member driving.  Discharge instructions reviewed.  Verbalized understanding.  No questions or concerns at this time.  Teach back verified.  Pt in NAD.  No items left in ED.   

## 2017-09-10 ENCOUNTER — Encounter: Payer: Self-pay | Admitting: Internal Medicine

## 2017-09-10 ENCOUNTER — Ambulatory Visit: Payer: BLUE CROSS/BLUE SHIELD | Admitting: Internal Medicine

## 2017-09-10 VITALS — BP 132/78 | HR 76 | Temp 98.2°F | Wt 178.0 lb

## 2017-09-10 DIAGNOSIS — M545 Low back pain: Secondary | ICD-10-CM | POA: Diagnosis not present

## 2017-09-10 DIAGNOSIS — G8929 Other chronic pain: Secondary | ICD-10-CM | POA: Diagnosis not present

## 2017-09-10 DIAGNOSIS — E7800 Pure hypercholesterolemia, unspecified: Secondary | ICD-10-CM | POA: Diagnosis not present

## 2017-09-10 DIAGNOSIS — I10 Essential (primary) hypertension: Secondary | ICD-10-CM

## 2017-09-10 DIAGNOSIS — J449 Chronic obstructive pulmonary disease, unspecified: Secondary | ICD-10-CM

## 2017-09-10 DIAGNOSIS — E785 Hyperlipidemia, unspecified: Secondary | ICD-10-CM | POA: Insufficient documentation

## 2017-09-10 LAB — COMPREHENSIVE METABOLIC PANEL
ALT: 21 U/L (ref 0–35)
AST: 15 U/L (ref 0–37)
Albumin: 4.1 g/dL (ref 3.5–5.2)
Alkaline Phosphatase: 88 U/L (ref 39–117)
BUN: 13 mg/dL (ref 6–23)
CO2: 30 mEq/L (ref 19–32)
Calcium: 9.8 mg/dL (ref 8.4–10.5)
Chloride: 100 mEq/L (ref 96–112)
Creatinine, Ser: 0.78 mg/dL (ref 0.40–1.20)
GFR: 81.97 mL/min (ref >60.00)
Glucose, Bld: 88 mg/dL (ref 70–99)
Potassium: 4 mEq/L (ref 3.5–5.1)
Sodium: 136 mEq/L (ref 135–145)
Total Bilirubin: 0.3 mg/dL (ref 0.2–1.2)
Total Protein: 7.6 g/dL (ref 6.0–8.3)

## 2017-09-10 LAB — LIPID PANEL
Cholesterol: 227 mg/dL — ABNORMAL HIGH (ref 0–200)
HDL: 42.5 mg/dL (ref >39.00)
Total CHOL/HDL Ratio: 5
Triglycerides: 413 mg/dL — ABNORMAL HIGH (ref 0.0–149.0)

## 2017-09-10 LAB — LDL CHOLESTEROL, DIRECT: Direct LDL: 136 mg/dL

## 2017-09-10 NOTE — Patient Instructions (Signed)
Fat and Cholesterol Restricted Diet Getting too much fat and cholesterol in your diet may cause health problems. Following this diet helps keep your fat and cholesterol at normal levels. This can keep you from getting sick. What types of fat should I choose?  Choose monosaturated and polyunsaturated fats. These are found in foods such as olive oil, canola oil, flaxseeds, walnuts, almonds, and seeds.  Eat more omega-3 fats. Good choices include salmon, mackerel, sardines, tuna, flaxseed oil, and ground flaxseeds.  Limit saturated fats. These are in animal products such as meats, butter, and cream. They can also be in plant products such as palm oil, palm kernel oil, and coconut oil.  Avoid foods with partially hydrogenated oils in them. These contain trans fats. Examples of foods that have trans fats are stick margarine, some tub margarines, cookies, crackers, and other baked goods. What general guidelines do I need to follow?  Check food labels. Look for the words "trans fat" and "saturated fat."  When preparing a meal: ? Fill half of your plate with vegetables and green salads. ? Fill one fourth of your plate with whole grains. Look for the word "whole" as the first word in the ingredient list. ? Fill one fourth of your plate with lean protein foods.  Eat more foods that have fiber, like apples, carrots, beans, peas, and barley.  Eat more home-cooked foods. Eat less at restaurants and buffets.  Limit or avoid alcohol.  Limit foods high in starch and sugar.  Limit fried foods.  Cook foods without frying them. Baking, boiling, grilling, and broiling are all great options.  Lose weight if you are overweight. Losing even a small amount of weight can help your overall health. It can also help prevent diseases such as diabetes and heart disease. What foods can I eat? Grains Whole grains, such as whole wheat or whole grain breads, crackers, cereals, and pasta. Unsweetened oatmeal,  bulgur, barley, quinoa, or brown rice. Corn or whole wheat flour tortillas. Vegetables Fresh or frozen vegetables (raw, steamed, roasted, or grilled). Green salads. Fruits All fresh, canned (in natural juice), or frozen fruits. Meat and Other Protein Products Ground beef (85% or leaner), grass-fed beef, or beef trimmed of fat. Skinless chicken or turkey. Ground chicken or turkey. Pork trimmed of fat. All fish and seafood. Eggs. Dried beans, peas, or lentils. Unsalted nuts or seeds. Unsalted canned or dry beans. Dairy Low-fat dairy products, such as skim or 1% milk, 2% or reduced-fat cheeses, low-fat ricotta or cottage cheese, or plain low-fat yogurt. Fats and Oils Tub margarines without trans fats. Light or reduced-fat mayonnaise and salad dressings. Avocado. Olive, canola, sesame, or safflower oils. Natural peanut or almond butter (choose ones without added sugar and oil). The items listed above may not be a complete list of recommended foods or beverages. Contact your dietitian for more options. What foods are not recommended? Grains White bread. White pasta. White rice. Cornbread. Bagels, pastries, and croissants. Crackers that contain trans fat. Vegetables White potatoes. Corn. Creamed or fried vegetables. Vegetables in a cheese sauce. Fruits Dried fruits. Canned fruit in light or heavy syrup. Fruit juice. Meat and Other Protein Products Fatty cuts of meat. Ribs, chicken wings, bacon, sausage, bologna, salami, chitterlings, fatback, hot dogs, bratwurst, and packaged luncheon meats. Liver and organ meats. Dairy Whole or 2% milk, cream, half-and-half, and cream cheese. Whole milk cheeses. Whole-fat or sweetened yogurt. Full-fat cheeses. Nondairy creamers and whipped toppings. Processed cheese, cheese spreads, or cheese curds. Sweets and Desserts Corn   syrup, sugars, honey, and molasses. Candy. Jam and jelly. Syrup. Sweetened cereals. Cookies, pies, cakes, donuts, muffins, and ice  cream. Fats and Oils Butter, stick margarine, lard, shortening, ghee, or bacon fat. Coconut, palm kernel, or palm oils. Beverages Alcohol. Sweetened drinks (such as sodas, lemonade, and fruit drinks or punches). The items listed above may not be a complete list of foods and beverages to avoid. Contact your dietitian for more information. This information is not intended to replace advice given to you by your health care provider. Make sure you discuss any questions you have with your health care provider. Document Released: 12/19/2011 Document Revised: 02/24/2016 Document Reviewed: 09/18/2013 Elsevier Interactive Patient Education  2018 [employer]  

## 2017-09-10 NOTE — Progress Notes (Signed)
HPI  Pt presents to the clinic today to establish care and for management of the conditions listed below. She is transferring care from Rennie PlowmanMargaret Arnett, NP.  COPD: Managed on Trelegy. She takes Albuterol 1-2 times per week. She does continue to smoke  Chronic Back Pain: She has had xrays and MRI's more than 10 years ago. She does not take anything for her back, she just deals with it.  HTN: Her BP today is 132/78. She is taking Amlodipine as prescribed. ECG from 04/2016 reviewed.  HLD: Her last LDL was 105, 05/2016. She has been out of Pravastatin for the last 5 months. She does not try to consume a low fat diet.   Flu: never Tetanus: 2008 Mammogram: 05/2016 Pap Smear: 04/2016 Colon Screening: 2004  Past Medical History:  Diagnosis Date  . Back pain, lumbosacral   . COPD (chronic obstructive pulmonary disease) (HCC)    no pulmonologist  . ONGEXBMW(413.2Headache(784.0)   . Hypertension   . PPD positive, treated 1998    Current Outpatient Medications  Medication Sig Dispense Refill  . albuterol (PROVENTIL HFA;VENTOLIN HFA) 108 (90 Base) MCG/ACT inhaler Inhale 2 puffs into the lungs every 4 (four) hours as needed for wheezing or shortness of breath. 1 Inhaler 11  . albuterol (PROVENTIL) (2.5 MG/3ML) 0.083% nebulizer solution Take 3 mLs (2.5 mg total) by nebulization every 6 (six) hours as needed for wheezing or shortness of breath. 150 mL 1  . amLODipine (NORVASC) 5 MG tablet Take 1 tablet (5 mg total) by mouth daily. 30 tablet 0  . fluticasone (FLONASE) 50 MCG/ACT nasal spray Place 2 sprays into both nostrils daily. 16 g 2  . Fluticasone-Umeclidin-Vilant (TRELEGY ELLIPTA) 100-62.5-25 MCG/INH AEPB Inhale 1 puff into the lungs daily. 1 each 11  . nicotine (NICOTINE STEP 1) 21 mg/24hr patch Place 1 patch (21 mg total) onto the skin daily. (Patient not taking: Reported on 09/10/2017) 28 patch 0  . pravastatin (PRAVACHOL) 40 MG tablet TAKE ONE TABLET BY MOUTH ONCE DAILY (Patient not taking: Reported on  09/10/2017) 90 tablet 2   No current facility-administered medications for this visit.     Allergies  Allergen Reactions  . Lisinopril Cough    Family History  Problem Relation Age of Onset  . Diverticulitis Mother   . Deep vein thrombosis Mother   . Arthritis Maternal Grandmother   . Osteoporosis Maternal Grandmother   . Cancer Maternal Aunt        brain  . Breast cancer Neg Hx     Social History   Socioeconomic History  . Marital status: Widowed    Spouse name: Not on file  . Number of children: 2  . Years of education: Not on file  . Highest education level: Not on file  Social Needs  . Financial resource strain: Not on file  . Food insecurity - worry: Not on file  . Food insecurity - inability: Not on file  . Transportation needs - medical: Not on file  . Transportation needs - non-medical: Not on file  Occupational History  . 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 Smoking status: Current Every Day Smoker    Packs/day: 1.00    Years: 28.00    Pack years: 28.00  . Smokeless tobacco: Never Used  . Tobacco comment: Decline Cessation information/SLS  Substance and Sexual Activity  . Alcohol use: Yes    Comment: 1-2 drinks beer every day  . Drug use: No  .  Sexual activity: Not Currently  Other Topics Concern  . Not on file  Social History Narrative   Lives alone in [place], no pets. Both children living with her.       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       Regular Exercise -  NO   Daily Caffeine Use:  6 - 8 sodas   Diet-    ROS:  Constitutional: Denies fever, malaise, fatigue, headache or abrupt weight changes.  HEENT: Denies eye pain, eye redness, ear pain, ringing in the ears, wax buildup, runny nose, nasal congestion, bloody nose, or sore throat. Respiratory: Denies difficulty breathing, shortness of breath, cough or sputum production.   Cardiovascular: Denies chest pain, chest tightness, palpitations or swelling in  the hands or feet.  Gastrointestinal: Denies abdominal pain, bloating, constipation, diarrhea or blood in the stool.  GU: Denies frequency, urgency, pain with urination, blood in urine, odor or discharge. Musculoskeletal: Pt reports chronic back pain. Denies decrease in range of motion, difficulty with gait, or joint swelling.  Skin: Denies redness, rashes, lesions or ulcercations.  Neurological: Denies dizziness, difficulty with memory, difficulty with speech or problems with balance and coordination.  Psych: Denies anxiety, depression, SI/HI.  No other specific complaints in a complete review of systems (except as listed in HPI above).  PE:  BP 132/78   Pulse 76   Temp 98.2 F (36.8 C) (Oral)   Wt 178 lb (80.7 kg)   SpO2 95%   BMI 30.55 kg/m   Wt Readings from Last 3 Encounters:  09/10/17 178 lb (80.7 kg)  09/01/17 184 lb (83.5 kg)  08/13/17 187 lb 8 oz (85 kg)    General: Appears older than her state age, in NAD. Cardiovascular: Normal rate and rhythm. S1,S2 noted. No carotid bruits noted. Pulmonary/Chest: Normal effort and positive vesicular breath sounds. No respiratory distress. No wheezes, rales or ronchi noted.  Abdomen: Soft and nontender. Normal bowel sounds. Musculoskeletal: Tender to palpation over the lumbar spine. No difficulty with gait. Neurological: Alert and oriented. Psychiatric: Mood and affect normal. Behavior is normal. Judgment and thought content normal.    BMET    Component Value Date/Time   NA 138 04/18/2016 1046   K 4.3 04/18/2016 1046   CL 105 04/18/2016 1046   CO2 25 04/18/2016 1046   GLUCOSE 109 (H) 04/18/2016 1046   BUN 8 04/18/2016 1046   CREATININE 0.68 04/18/2016 1046   CREATININE 0.60 05/29/2012 1521   CALCIUM 9.3 04/18/2016 1046   GFRNONAA >60 04/18/2016 1046   GFRAA >60 04/18/2016 1046    Lipid Panel     Component Value Date/Time   CHOL 190 04/10/2016 0812   TRIG 387.0 (H) 04/10/2016 0812   HDL 38.00 (L) 04/10/2016 0812    CHOLHDL 5 04/10/2016 0812   VLDL 77.4 (H) 04/10/2016 0812   LDLCALC 75 12/15/2011 0936    CBC    Component Value Date/Time   WBC 8.0 04/18/2016 1046   RBC 4.81 04/18/2016 1046   HGB 15.5 04/18/2016 1046   HCT 44.5 04/18/2016 1046   PLT 217 04/18/2016 1046   MCV 92.7 04/18/2016 1046   MCH 32.3 04/18/2016 1046   MCHC 34.9 04/18/2016 1046   RDW 13.9 04/18/2016 1046   LYMPHSABS 2.1 04/10/2016 0812   MONOABS 0.6 04/10/2016 0812   EOSABS 0.3 04/10/2016 0812   BASOSABS 0.1 04/10/2016 0812    Hgb A1C No results found for: HGBA1C   Assessment and Plan:

## 2017-09-10 NOTE — Assessment & Plan Note (Addendum)
CMET and Lipid profile today Encouraged her to consume a low fat diet Advised her I would let her know if she needed to restart Pravastatin

## 2017-09-10 NOTE — Assessment & Plan Note (Signed)
Controlled on Amlodipine Discussed DASH diet and exercise for weight loss

## 2017-09-10 NOTE — Assessment & Plan Note (Signed)
Encouraged core strengthening and exercise for weight loss

## 2017-09-10 NOTE — Assessment & Plan Note (Signed)
Discussed smoking cessation- she declines Continue Trelegy and Albuterol

## 2017-12-14 ENCOUNTER — Other Ambulatory Visit: Payer: Self-pay | Admitting: Internal Medicine

## 2017-12-14 DIAGNOSIS — I10 Essential (primary) hypertension: Secondary | ICD-10-CM

## 2017-12-17 MED ORDER — AMLODIPINE BESYLATE 5 MG PO TABS: 5.0000 mg | ORAL_TABLET | Freq: Every day | ORAL | 0 refills | Status: DC

## 2017-12-17 NOTE — Addendum Note (Signed)
Addended by: Roena MaladyEVONTENNO, MELANIE Y on: 12/17/2017 04:13 PM   Modules accepted: Orders

## 2017-12-17 NOTE — Addendum Note (Signed)
Addended by: Roena MaladyEVONTENNO, Ahja Martello Y on: 12/17/2017 04:35 PM   Modules accepted: Orders

## 2017-12-24 ENCOUNTER — Encounter: Payer: Self-pay | Admitting: Internal Medicine

## 2017-12-24 ENCOUNTER — Ambulatory Visit: Payer: BLUE CROSS/BLUE SHIELD | Admitting: Internal Medicine

## 2017-12-24 ENCOUNTER — Ambulatory Visit (INDEPENDENT_AMBULATORY_CARE_PROVIDER_SITE_OTHER)
Admission: RE | Admit: 2017-12-24 | Discharge: 2017-12-24 | Disposition: A | Discharge status: Home / Self Care | Payer: BLUE CROSS/BLUE SHIELD | Source: Ambulatory Visit | Attending: Internal Medicine | Admitting: Internal Medicine

## 2017-12-24 ENCOUNTER — Telehealth: Payer: Self-pay

## 2017-12-24 VITALS — BP 140/84 | HR 84 | Temp 98.1°F | Wt 183.0 lb

## 2017-12-24 DIAGNOSIS — M25461 Effusion, right knee: Secondary | ICD-10-CM

## 2017-12-24 DIAGNOSIS — M25561 Pain in right knee: Secondary | ICD-10-CM

## 2017-12-24 MED ORDER — PRAVASTATIN SODIUM 40 MG PO TABS: 40.0000 mg | ORAL_TABLET | Freq: Every day | ORAL | 0 refills | Status: DC

## 2017-12-24 MED ORDER — NAPROXEN 375 MG PO TABS
375.0000 mg | ORAL_TABLET | Freq: Two times a day (BID) | ORAL | 0 refills | Status: DC
Start: 2017-12-24 — End: 2019-01-01

## 2017-12-24 MED ORDER — NAPROXEN 375 MG PO TABS: 375.0000 mg | ORAL_TABLET | Freq: Two times a day (BID) | ORAL | 0 refills | Status: DC

## 2017-12-24 NOTE — Telephone Encounter (Signed)
FYI Patient states that when calling about her Xray result she may not be able to pick up the phone due to been at work and that we can leave a detailed message on her phone [phone] if she does not answer. We do have DPR on file allowing us to do that.Consuella Lose-Araseli Sherry V Avary Pitsenbarger, RMA

## 2017-12-24 NOTE — Patient Instructions (Signed)

## 2017-12-24 NOTE — Progress Notes (Signed)
Subjective:    Patient ID: Allison Brennan, female    DOB: April 17, 1966, 52 y.o.   MRN: [id number]  HPI  Pt presents to the clinic today with c/o right knee pain and swelling. This started 3-4 months ago. She describes the pain as in the front of her knee, but notices a pulling sensation in the back of her knee. The pain is worse with weight bearing. She had a MRI in 2013 which showed:  IMPRESSION:   1. There is a tear of the posterior horn of the medial meniscus. The lateral meniscus appears intact.  2. The ACL and PCL are intact. The medial and lateral collateral ligaments also appear intact.  3. I do not see significant degenerative change of the articular cartilages. The bone marrow edema is within the limits of normal for age.   She had a MCL repair in 2014. She denies any recent injury to the area. She has tried ice and Ibuprofen with minimal relief .  Review of Systems  Past Medical History:  Diagnosis Date  . Back pain, lumbosacral   . COPD (chronic obstructive pulmonary disease) (HCC)    no pulmonologist  . WUJWJXBJ(478.2Headache(784.0)   . Hypertension   . PPD positive, treated 1998    Current Outpatient Medications  Medication Sig Dispense Refill  . albuterol (PROVENTIL HFA;VENTOLIN HFA) 108 (90 Base) MCG/ACT inhaler Inhale 2 puffs into the lungs every 4 (four) hours as needed for wheezing or shortness of breath. 1 Inhaler 11  . albuterol (PROVENTIL) (2.5 MG/3ML) 0.083% nebulizer solution Take 3 mLs (2.5 mg total) by nebulization every 6 (six) hours as needed for wheezing or shortness of breath. 150 mL 1  . amLODipine (NORVASC) 5 MG tablet Take 1 tablet (5 mg total) by mouth daily. 90 tablet 0  . fluticasone (FLONASE) 50 MCG/ACT nasal spray Place 2 sprays into both nostrils daily. 16 g 2  . Fluticasone-Umeclidin-Vilant (TRELEGY ELLIPTA) 100-62.5-25 MCG/INH AEPB Inhale 1 puff into the lungs daily. 1 each 11  . nicotine (NICOTINE STEP 1) 21 mg/24hr patch Place 1 patch (21 mg total) onto  the skin daily. (Patient not taking: Reported on 09/10/2017) 28 patch 0  . pravastatin (PRAVACHOL) 40 MG tablet TAKE ONE TABLET BY MOUTH ONCE DAILY (Patient not taking: Reported on 09/10/2017) 90 tablet 2   No current facility-administered medications for this visit.     Allergies  Allergen Reactions  . Lisinopril Cough    Family History  Problem Relation Age of Onset  . Diverticulitis Mother   . Deep vein thrombosis Mother   . Arthritis Maternal Grandmother   . Osteoporosis Maternal Grandmother   . Cancer Maternal Aunt        brain  . Breast cancer Neg Hx     Social History   Socioeconomic History  . Marital status: Widowed    Spouse name: Not on file  . Number of children: 2  . Years of education: Not on file  . Highest education level: Not on file  Occupational History  . 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 Financial resource strain: Not on file  . Food insecurity:    Worry: Not on file    Inability: Not on file  . Transportation needs:    Medical: Not on file    Non-medical: Not on file  Tobacco Use  . Smoking status: Current Every Day Smoker    Packs/day: 1.00    Years: 28.00  Pack years: 28.00  . Smokeless tobacco: Never Used  . Tobacco comment: Decline Cessation information/SLS  Substance and Sexual Activity  . Alcohol use: Yes    Alcohol/week: 1.2 oz    Types: 2 Cans of beer per week    Comment: 1-2 drinks beer every day  . Drug use: No  . Sexual activity: Not Currently  Lifestyle  . Physical activity:    Days per week: Not on file    Minutes per session: Not on file  . Stress: Not on file  Relationships  . Social connections:    Talks on phone: Not on file    Gets together: Not on file    Attends religious service: Not on file    Active member of club or organization: Not on file    Attends meetings of clubs or organizations: Not on file    Relationship status: Not on file  . Intimate partner violence:    Fear of  current or ex partner: Not on file    Emotionally abused: Not on file    Physically abused: Not on file    Forced sexual activity: Not on file  Other Topics Concern  . Not on file  Social History Narrative   Lives alone in [place], no pets. Both children living with her.       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       Regular Exercise -  NO   Daily Caffeine Use:  6 - 8 sodas   Diet-     Constitutional: Denies fever, malaise, fatigue, headache or abrupt weight changes.  Musculoskeletal: Pt reports right knee pain and swelling. Denies decrease in range of motion, difficulty with gait, muscle pain or joint pain and swelling.  Skin: Denies redness, rashes, lesions or ulcercations.    No other specific complaints in a complete review of systems (except as listed in HPI above).     Objective:   Physical Exam   BP 140/84   Pulse 84   Temp 98.1 F (36.7 C) (Oral)   Wt 183 lb (83 kg)   SpO2 95%   BMI 31.41 kg/m  Wt Readings from Last 3 Encounters:  12/24/17 183 lb (83 kg)  09/10/17 178 lb (80.7 kg)  09/01/17 184 lb (83.5 kg)    General: Appears her stated age,  in NAD. Musculoskeletal: Normal flexion and extension of the left knee. No joint swelling noted. Pain with palpation over the medial knee. Negative Lachman's, negative McMurray. Strength 5/5 BLE. Gait slow and steady without device. Neurological: Alert and oriented. Sensation intact to BLE.   BMET    Component Value Date/Time   NA 136 09/10/2017 1510   K 4.0 09/10/2017 1510   CL 100 09/10/2017 1510   CO2 30 09/10/2017 1510   GLUCOSE 88 09/10/2017 1510   BUN 13 09/10/2017 1510   CREATININE 0.78 09/10/2017 1510   CREATININE 0.60 05/29/2012 1521   CALCIUM 9.8 09/10/2017 1510   GFRNONAA >60 04/18/2016 1046   GFRAA >60 04/18/2016 1046    Lipid Panel     Component Value Date/Time   CHOL 227 (H) 09/10/2017 1510   TRIG (H) 09/10/2017 1510    413.0 Triglyceride is over 400; calculations on Lipids are  invalid.   HDL 42.50 09/10/2017 1510   CHOLHDL 5 09/10/2017 1510   VLDL 77.4 (H) 04/10/2016 0812   LDLCALC 75 12/15/2011 0936    CBC    Component Value Date/Time   WBC 8.0 04/18/2016 1046  RBC 4.81 04/18/2016 1046   HGB 15.5 04/18/2016 1046   HCT 44.5 04/18/2016 1046   PLT 217 04/18/2016 1046   MCV 92.7 04/18/2016 1046   MCH 32.3 04/18/2016 1046   MCHC 34.9 04/18/2016 1046   RDW 13.9 04/18/2016 1046   LYMPHSABS 2.1 04/10/2016 0812   MONOABS 0.6 04/10/2016 0812   EOSABS 0.3 04/10/2016 0812   BASOSABS 0.1 04/10/2016 0812    Hgb A1C No results found for: HGBA1C         Assessment & Plan:   Right Knee Pain and Swelling:  Xray right knee today eRx for Naproxen 375 mg BID, avoid other NSAID's, take with food Knee exercises given, discussed possible PT May need MRI vs ortho referral  Will follow up after xray, return precautions discussed Nicki Reaper, NP

## 2017-12-25 NOTE — Telephone Encounter (Signed)
Pt called to check status of imaging results, call when ready

## 2017-12-25 NOTE — Telephone Encounter (Signed)
This has not been resulted yet, I will call when I get the results

## 2017-12-26 NOTE — Telephone Encounter (Signed)
See result note.  

## 2018-01-22 ENCOUNTER — Telehealth: Payer: Self-pay

## 2018-01-22 DIAGNOSIS — M25561 Pain in right knee: Secondary | ICD-10-CM

## 2018-01-22 NOTE — Telephone Encounter (Signed)
Copied from CRM [id number]. Topic: Referral - Request >> Jan 22, 2018  3:36 PM [name], Lumin L wrote: Reason for CRM: Patient calling to request a different referral for her knee. I don't see any referral in her chart. Please advise.

## 2018-01-22 NOTE — Telephone Encounter (Signed)
Patient called back after getting letter in the mail and stated she wants to see an orthopedic doctor for right knee pain as per suggestion from Nicki Reaperegina Baity, NP. Please order. Thank Neta Mendsyou-Anastasiya V Hopkins, RMA

## 2018-01-23 NOTE — Telephone Encounter (Signed)
Referral placed.

## 2018-01-23 NOTE — Telephone Encounter (Signed)
See other phone noted dated today 

## 2018-01-23 NOTE — Addendum Note (Signed)
Addended by: Lorre MunroeBAITY, REGINA W on: 01/23/2018 02:24 PM   Modules accepted: Orders

## 2018-03-28 ENCOUNTER — Telehealth: Payer: Self-pay | Admitting: Internal Medicine

## 2018-03-28 DIAGNOSIS — I10 Essential (primary) hypertension: Secondary | ICD-10-CM

## 2018-03-28 NOTE — Telephone Encounter (Signed)
Copied from CRM [id number]. Topic: Quick Communication - Rx Refill/Question >> Mar 28, 2018  2:21 PM Jens Som A wrote: Medication: amLODipine (NORVASC) 5 MG tablet [[id number]]   Has the patient contacted their pharmacy? Yes  (Agent: If no, request that the patient contact the pharmacy for the refill.) (Agent: If yes, when and what did the pharmacy advise?) [facility] [address]), [place] - [address] [place]) [place] [address] Phone: [phone] Fax: [phone]   Preferred Pharmacy (with phone number or street name):   Agent: Please be advised that RX refills may take up to 3 business days. We ask that you follow-up with your pharmacy.

## 2018-04-01 MED ORDER — AMLODIPINE BESYLATE 5 MG PO TABS: 5.0000 mg | ORAL_TABLET | Freq: Every day | ORAL | 0 refills | Status: DC

## 2018-04-01 NOTE — Telephone Encounter (Signed)
Rx sent through e-scribe  

## 2018-04-01 NOTE — Addendum Note (Signed)
Addended by: Roena Malady on: 04/01/2018 05:25 PM   Modules accepted: Orders

## 2018-05-27 ENCOUNTER — Encounter: Payer: Self-pay | Admitting: Internal Medicine

## 2018-05-27 DIAGNOSIS — Z0289 Encounter for other administrative examinations: Secondary | ICD-10-CM

## 2018-07-09 ENCOUNTER — Other Ambulatory Visit: Payer: Self-pay | Admitting: Internal Medicine

## 2018-07-09 DIAGNOSIS — I10 Essential (primary) hypertension: Secondary | ICD-10-CM

## 2018-07-10 MED ORDER — AMLODIPINE BESYLATE 5 MG PO TABS: 5.0000 mg | ORAL_TABLET | Freq: Every day | ORAL | 0 refills | Status: DC

## 2018-07-16 ENCOUNTER — Other Ambulatory Visit: Payer: Self-pay | Admitting: Internal Medicine

## 2018-07-16 DIAGNOSIS — I10 Essential (primary) hypertension: Secondary | ICD-10-CM

## 2018-07-25 ENCOUNTER — Other Ambulatory Visit: Payer: Self-pay | Admitting: Internal Medicine

## 2018-07-25 DIAGNOSIS — I10 Essential (primary) hypertension: Secondary | ICD-10-CM

## 2018-07-26 MED ORDER — AMLODIPINE BESYLATE 5 MG PO TABS: 5.0000 mg | ORAL_TABLET | Freq: Every day | ORAL | 0 refills | Status: DC

## 2018-08-05 ENCOUNTER — Telehealth: Payer: Self-pay | Admitting: Internal Medicine

## 2018-08-05 DIAGNOSIS — I10 Essential (primary) hypertension: Secondary | ICD-10-CM

## 2018-08-05 NOTE — Telephone Encounter (Signed)
Need refill for   Amlodipine    Sent to [facility]

## 2018-08-07 MED ORDER — AMLODIPINE BESYLATE 5 MG PO TABS: 5.0000 mg | ORAL_TABLET | Freq: Every day | ORAL | 1 refills | Status: DC

## 2018-08-07 NOTE — Addendum Note (Signed)
Addended by: Roena Malady on: 08/07/2018 12:46 PM   Modules accepted: Orders

## 2018-08-08 NOTE — Telephone Encounter (Signed)
For some reason the Rx kept printing, Rx faxed

## 2018-09-18 ENCOUNTER — Telehealth: Payer: Self-pay | Admitting: Internal Medicine

## 2018-09-18 NOTE — Telephone Encounter (Signed)
Left message asking pt to call office please r/s 3/27 appointment with regina baity

## 2018-09-23 ENCOUNTER — Ambulatory Visit: Payer: Self-pay | Admitting: Internal Medicine

## 2018-09-26 ENCOUNTER — Encounter: Payer: Self-pay | Admitting: Internal Medicine

## 2018-09-26 ENCOUNTER — Ambulatory Visit: Payer: Self-pay | Admitting: Internal Medicine

## 2018-09-26 ENCOUNTER — Other Ambulatory Visit: Payer: Self-pay

## 2018-09-26 NOTE — Progress Notes (Deleted)
Virtual Visit via Telephone Note  I connected with Allison Brennan on 09/26/18 at 12:15 PM EDT by telephone and verified that I am speaking with the correct person using two identifiers.   I discussed the limitations, risks, security and privacy concerns of performing an evaluation and management service by telephone and the availability of in person appointments. I also discussed with the patient that there may be a patient responsible charge related to this service. The patient expressed understanding and agreed to proceed.  Allison Brennan was at home, and I was in my office at [facility] during this phone call.   History of Present Illness: Pt due for routine follow up of chronic conditions.  HTN: She is not monitoring her blood pressure at home. She is not taking any antihypertensive medications at this time.   HLD: Her last LDL was 136, triglycerides 413, 08/2017. She is taking Pravastatin and Fish Oil as prescribed. She denies myalgias. She tries to consume a low fat diet.  COPD: She denies chronic cough or SOB. She is taking Trelegy as prescribed and Albuterol as needed. She follows with Dr. Nicholos Johns. PFT's from 12/2016 reviewed. She does continue to smoke.   Chronic Low Back  Pain: Chronic but stable with prn Naproxen. MRI lumbar spine from 03/2012 reviewed.     Observations/Objective: Alert and oriented x 3. NAD. No obvious cough or SOB. No co/o pain at this time.   Assessment and Plan:  HTN:  Unable to check BP at this time Advised to monitor at home and call me with readings Reinforced DASH diet and exercise for weight loss  HLD:  Encouraged her to consume a lab only appt Will have her come to drive thru for Lipid, CMET  Continue Pravastatin and Fish Oil for now  COPD:  Encouraged smoking cessation Continue Trelegy and Albuterol She will continue to follow with Dr. Nicholos Johns  Chronic Low Back Pain:  Discussed exercise for weight loss and core  strengthening Encouraged stretching at home Continue Naproxen prn Will have her come to drive thru for CMET Follow Up Instructions:    I discussed the assessment and treatment plan with the patient. The patient was provided an opportunity to ask questions and all were answered. The patient agreed with the plan and demonstrated an understanding of the instructions.   The patient was advised to call back or seek an in-person evaluation if the symptoms worsen or if the condition fails to improve as anticipated.  I provided 12 minutes of non-face-to-face time during this encounter.   Nicki Reaper, NP

## 2018-09-27 ENCOUNTER — Encounter: Payer: Self-pay | Admitting: Internal Medicine

## 2018-10-22 ENCOUNTER — Other Ambulatory Visit: Payer: Self-pay | Admitting: Internal Medicine

## 2018-10-22 MED ORDER — ALBUTEROL SULFATE HFA 108 (90 BASE) MCG/ACT IN AERS: 2.0000 | INHALATION_SPRAY | RESPIRATORY_TRACT | 0 refills | Status: DC | PRN

## 2018-10-22 NOTE — Telephone Encounter (Signed)
Patient needs a refill on her Albuterol Inhaler.. She needs a refill on Amlodipine.  She's been out of both medications for months.  Patient said when her cpx was cancelled in March, she asked for a refill on the medications. Patient uses [facility] [address]. Patient said both of her ankles are swollen.

## 2018-10-29 ENCOUNTER — Other Ambulatory Visit: Payer: Self-pay | Admitting: Internal Medicine

## 2018-10-29 ENCOUNTER — Encounter: Payer: Self-pay | Admitting: Primary Care

## 2018-10-29 ENCOUNTER — Ambulatory Visit (INDEPENDENT_AMBULATORY_CARE_PROVIDER_SITE_OTHER): Payer: Self-pay | Admitting: Primary Care

## 2018-10-29 DIAGNOSIS — R197 Diarrhea, unspecified: Secondary | ICD-10-CM | POA: Insufficient documentation

## 2018-10-29 NOTE — Assessment & Plan Note (Signed)
Suspect viral GI etiology, likely from pizza from take out. Today she appears stable, resting comfortably during visit. Recommended she work on hydration with water and advance diet slowly as tolerated. Also discussed that if diarrhea is present after another 24 hours to then try Imodium. She agrees to notify me if she develops fevers, increased pain, blood in the stools. Consider labs if diarrhea persists.

## 2018-10-29 NOTE — Patient Instructions (Signed)
Continue to work on hydration with water and low calorie gatorade.  You can try Imodium or Pepto Bismol for diarrhea/stomach upset Thursday this week if your diarrhea continues.  Please call me if you develop fevers, increased abdominal pain, blood in the stools, persistent diarrhea.  Take a look at the information below.  It was a pleasure meeting you! Mayra Reel, NP-C   Parke Simmers Diet A bland diet consists of foods that are often soft and do not have a lot of fat, fiber, or extra seasonings. Foods without fat, fiber, or seasoning are easier for the body to digest. They are also less likely to irritate your mouth, throat, stomach, and other parts of your digestive system. A bland diet is sometimes called a BRAT diet. What is my plan? Your health care provider or food and nutrition specialist (dietitian) may recommend specific changes to your diet to prevent symptoms or to treat your symptoms. These changes may include:  Eating small meals often.  Cooking food until it is soft enough to chew easily.  Chewing your food well.  Drinking fluids slowly.  Not eating foods that are very spicy, sour, or fatty.  Not eating citrus fruits, such as oranges and grapefruit. What do I need to know about this diet?  Eat a variety of foods from the bland diet food list.  Do not follow a bland diet longer than needed.  Ask your health care provider whether you should take vitamins or supplements. What foods can I eat? Grains  Hot cereals, such as cream of wheat. Rice. Bread, crackers, or tortillas made from refined white flour. Vegetables Canned or cooked vegetables. Mashed or boiled potatoes. Fruits  Bananas. Applesauce. Other types of cooked or canned fruit with the skin and seeds removed, such as canned peaches or pears. Meats and other proteins  Scrambled eggs. Creamy peanut butter or other nut butters. Lean, well-cooked meats, such as chicken or fish. Tofu. Soups or broths. Dairy  Low-fat dairy products, such as milk, cottage cheese, or yogurt. Beverages  Water. Herbal tea. Apple juice. Fats and oils Mild salad dressings. Canola or olive oil. Sweets and desserts Pudding. Custard. Fruit gelatin. Ice cream. The items listed above may not be a complete list of recommended foods and beverages. Contact a dietitian for more options. What foods are not recommended? Grains Whole grain breads and cereals. Vegetables Raw vegetables. Fruits Raw fruits, especially citrus, berries, or dried fruits. Dairy Whole fat dairy foods. Beverages Caffeinated drinks. Alcohol. Seasonings and condiments Strongly flavored seasonings or condiments. Hot sauce. Salsa. Other foods Spicy foods. Fried foods. Sour foods, such as pickled or fermented foods. Foods with high sugar content. Foods high in fiber. The items listed above may not be a complete list of foods and beverages to avoid. Contact a dietitian for more information. Summary  A bland diet consists of foods that are often soft and do not have a lot of fat, fiber, or extra seasonings.  Foods without fat, fiber, or seasoning are easier for the body to digest.  Check with your health care provider to see how long you should follow this diet plan. It is not meant to be followed for long periods. This information is not intended to replace advice given to you by your health care provider. Make sure you discuss any questions you have with your health care provider. Document Released: 10/11/2015 Document Revised: 07/18/2017 Document Reviewed: 07/18/2017 Elsevier Interactive Patient Education  2019 [employer].

## 2018-10-29 NOTE — Progress Notes (Signed)
Subjective:    Patient ID: Allison Brennan, female    DOB: 11-20-1965, 53 y.o.   MRN: [id number]  HPI  Virtual Visit via Video Note  I connected with Allison Brennan on 10/29/18 at  3:40 PM EDT by a video enabled telemedicine application and verified that I am speaking with the correct person using two identifiers.   I discussed the limitations of evaluation and management by telemedicine and the availability of in person appointments. The patient expressed understanding and agreed to proceed. She is at home, I am in the office.  History of Present Illness:  Ms. Detoro is a 53 year old female with a history of hypertension, COPD, hyperlipidemia who presents today with a chief complaint of diarrhea.   Her symptoms began three days ago with "upset stomach" several hours after eating pizza from take out. Yesterday she began to have diarrhea and has had over 10 episodes daily yesterday and today. Stools are both watery and loose. She also reports esophageal reflux and nausea. Today she continues to feel abdominal cramping located at the periumbilical region. She's also noticed chills for two days, has not checked her temperature.  She denies vomiting, bloody stools, diaphoresis.  She's not taken anything OTC for her symptoms except for Tums with some improvement. She is eating and drinking well but will have diarrhea soon after she eats. Overall today she's feeling the same.   Observations/Objective:  Appears well, not sickly. Sitting up comfortably. Skin dry. Alert and oriented.   Assessment and Plan:  Suspect viral GI etiology, likely from pizza from take out. Today she appears stable, resting comfortably during visit. Recommended she work on hydration with water and advance diet slowly as tolerated. Also discussed that if diarrhea is present after another 24 hours to then try Imodium. She agrees to notify me if she develops fevers, increased pain, blood in the stools. Consider labs  if diarrhea persists.   Follow Up Instructions:  Continue to work on hydration with water and low calorie gatorade.  You can try Imodium or Pepto Bismol for diarrhea/stomach upset Thursday this week if your diarrhea continues.  Please call me if you develop fevers, increased abdominal pain, blood in the stools, persistent diarrhea.  Take a look at the information below.  It was a pleasure meeting you! Mayra Reel, NP-C    I discussed the assessment and treatment plan with the patient. The patient was provided an opportunity to ask questions and all were answered. The patient agreed with the plan and demonstrated an understanding of the instructions.   The patient was advised to call back or seek an in-person evaluation if the symptoms worsen or if the condition fails to improve as anticipated.     Doreene Nest, NP    Review of Systems  Constitutional: Positive for chills. Negative for fever.  Respiratory: Negative for shortness of breath.   Cardiovascular: Negative for chest pain.  Gastrointestinal: Positive for abdominal pain, diarrhea and nausea. Negative for blood in stool and vomiting.       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    Employer: [employer]  Social Network engineer strain: Not on file   Food insecurity:    Worry: Not on file    Inability: Not on file   Transportation needs:    Medical: Not on file    Non-medical: Not on file  Tobacco Use   Smoking status: Current Every Day Smoker    Packs/day: 1.00    Years:  28.00    Pack years: 28.00   Smokeless tobacco: Never Used   Tobacco comment: Decline Cessation information/SLS  Substance and Sexual Activity   Alcohol use: Yes    Alcohol/week: 2.0 standard drinks    Types: 2 Cans of beer per week    Comment: 1-2 drinks beer every day   Drug use: No   Sexual activity: Not Currently  Lifestyle   Physical activity:    Days per week: Not on file    Minutes per session: Not on file   Stress: Not on file  Relationships   Social connections:    Talks on phone: Not on file    Gets together: Not on file    Attends religious service: Not on file    Active member of club or organization: Not on file    Attends meetings of clubs or organizations: Not on file    Relationship status: Not on file   Intimate partner violence:    Fear of current or ex partner: Not on file    Emotionally abused: Not on file    Physically abused: Not on file    Forced sexual activity: Not on file  Other Topics Concern   Not on file  Social History Narrative   Lives alone in [place], no pets. Both children living with her.       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       Regular Exercise -  NO   Daily Caffeine Use:  6 - 8 sodas   Diet-    Past Surgical History:  Procedure Laterality Date   CESAREAN SECTION     x2   CLOSED REDUCTION FINGER WITH PERCUTANEOUS PINNING Left 06/17/2015   Procedure: CLOSED REDUCTION FINGER WITH PERCUTANEOUS PINNING little finger;  Surgeon: Kennedy Bucker, MD;  Location: [facility];  Service: Orthopedics;  Laterality: Left;   EXOSTECTECTOMY TOE Right 02/12/2015   Procedure: EXOSTECTECTOMY TOE;  Surgeon: Linus Galas, MD;  Location: [facility];  Service: Podiatry;  Laterality: Right;   KNEE ARTHROSCOPY  03/13/2012   Procedure: ARTHROSCOPY KNEE;  Surgeon: Loanne Drilling, MD;  Location: [facility];  Service: Orthopedics;  Laterality: Right;  debridementt right medial meniscus/chrondroplasty   PARTIAL HYSTERECTOMY  1999   pelvic pain     Family History  Problem Relation Age of Onset   Diverticulitis Mother    Deep vein thrombosis Mother    Arthritis Maternal Grandmother    Osteoporosis Maternal Grandmother    Cancer Maternal Aunt        brain   Breast cancer Neg Hx     Allergies  Allergen Reactions   Lisinopril Cough    Current Outpatient Medications on File Prior to Visit  Medication Sig Dispense Refill   albuterol (PROVENTIL) (2.5 MG/3ML) 0.083% nebulizer solution Take 3 mLs (2.5 mg total) by nebulization every 6 (six) hours as needed for wheezing or shortness of breath. 150 mL 1   albuterol (VENTOLIN HFA)  108 (90 Base) MCG/ACT inhaler Inhale 2 puffs into the lungs every 4 (four) hours as needed for wheezing or shortness of breath. 18 g 0   amLODipine (NORVASC) 5 MG tablet Take 1 tablet by mouth once daily 90 tablet 0   fluticasone (FLONASE) 50 MCG/ACT nasal spray Place 2 sprays into both nostrils daily. 16 g 2   Fluticasone-Umeclidin-Vilant (TRELEGY ELLIPTA) 100-62.5-25 MCG/INH AEPB Inhale 1 puff into the lungs daily. 1 each 11   naproxen (NAPROSYN) 375 MG tablet Take 1 tablet (375 mg total) by mouth 2 (two) times daily with a meal. 30 tablet 0   Omega-3 Fatty Acids (FISH OIL) 1000 MG CAPS Take 1 capsule by mouth daily.     pravastatin (PRAVACHOL) 40 MG tablet Take 1 tablet (40 mg total) by mouth daily. 90 tablet 0   No current facility-administered medications on file prior to visit.     There were no vitals taken for this visit.   Objective:   Physical Exam  Constitutional: She is oriented to person, place, and time. She appears well-nourished. She does not have a sickly appearance. She does not appear ill.  Resting comfortably during visit  Respiratory: Effort normal.  GI: Soft.  Neurological: She is alert and oriented to person, place, and time.  Skin: Skin is dry.  Psychiatric: She has a normal mood and affect.           Assessment & Plan:

## 2018-11-06 ENCOUNTER — Other Ambulatory Visit: Payer: Self-pay | Admitting: Internal Medicine

## 2018-11-08 MED ORDER — AMLODIPINE BESYLATE 5 MG PO TABS: 5.0000 mg | ORAL_TABLET | Freq: Every day | ORAL | 0 refills | Status: DC

## 2018-12-20 ENCOUNTER — Encounter: Payer: Self-pay | Admitting: Internal Medicine

## 2018-12-20 ENCOUNTER — Ambulatory Visit (INDEPENDENT_AMBULATORY_CARE_PROVIDER_SITE_OTHER): Payer: BC Managed Care – PPO | Admitting: Internal Medicine

## 2018-12-20 ENCOUNTER — Other Ambulatory Visit: Payer: Self-pay

## 2018-12-20 VITALS — BP 142/82 | HR 86 | Temp 98.3°F | Ht 63.75 in | Wt 184.5 lb

## 2018-12-20 DIAGNOSIS — J449 Chronic obstructive pulmonary disease, unspecified: Secondary | ICD-10-CM | POA: Diagnosis not present

## 2018-12-20 DIAGNOSIS — Z0000 Encounter for general adult medical examination without abnormal findings: Secondary | ICD-10-CM | POA: Diagnosis not present

## 2018-12-20 DIAGNOSIS — E785 Hyperlipidemia, unspecified: Secondary | ICD-10-CM | POA: Diagnosis not present

## 2018-12-20 DIAGNOSIS — Z23 Encounter for immunization: Secondary | ICD-10-CM

## 2018-12-20 DIAGNOSIS — J00 Acute nasopharyngitis [common cold]: Secondary | ICD-10-CM

## 2018-12-20 DIAGNOSIS — W548XXA Other contact with dog, initial encounter: Secondary | ICD-10-CM

## 2018-12-20 DIAGNOSIS — I10 Essential (primary) hypertension: Secondary | ICD-10-CM

## 2018-12-20 DIAGNOSIS — E559 Vitamin D deficiency, unspecified: Secondary | ICD-10-CM | POA: Diagnosis not present

## 2018-12-20 DIAGNOSIS — Z0001 Encounter for general adult medical examination with abnormal findings: Secondary | ICD-10-CM

## 2018-12-20 DIAGNOSIS — E782 Mixed hyperlipidemia: Secondary | ICD-10-CM

## 2018-12-20 MED ORDER — AMLODIPINE BESYLATE 5 MG PO TABS: 5.0000 mg | ORAL_TABLET | Freq: Every day | ORAL | 0 refills | Status: DC

## 2018-12-20 MED ORDER — FLUTICASONE PROPIONATE 50 MCG/ACT NA SUSP: 2.0000 | Freq: Every day | NASAL | 2 refills | Status: DC

## 2018-12-20 MED ORDER — ALBUTEROL SULFATE HFA 108 (90 BASE) MCG/ACT IN AERS: 2.0000 | INHALATION_SPRAY | RESPIRATORY_TRACT | 0 refills | Status: DC | PRN

## 2018-12-20 MED ORDER — PREMARIN 0.625 MG/GM VA CREA: 1.0000 | TOPICAL_CREAM | VAGINAL | 12 refills | Status: DC

## 2018-12-20 MED ORDER — FLUTICASONE-SALMETEROL 100-50 MCG/DOSE IN AEPB: 1.0000 | INHALATION_SPRAY | Freq: Two times a day (BID) | RESPIRATORY_TRACT | 3 refills | Status: DC

## 2018-12-20 NOTE — Patient Instructions (Signed)
Health Maintenance for Postmenopausal Women Menopause is a normal process in which your reproductive ability comes to an end. This process happens gradually over a span of months to years, usually between the ages of 62 and 89. Menopause is complete when you have missed 12 consecutive menstrual periods. It is important to talk with your health care provider about some of the most common conditions that affect postmenopausal women, such as heart disease, cancer, and bone loss (osteoporosis). Adopting a healthy lifestyle and getting preventive care can help to promote your health and wellness. Those actions can also lower your chances of developing some of these common conditions. What should I know about menopause? During menopause, you may experience a number of symptoms, such as:  Moderate-to-severe hot flashes.  Night sweats.  Decrease in sex drive.  Mood swings.  Headaches.  Tiredness.  Irritability.  Memory problems.  Insomnia. Choosing to treat or not to treat menopausal changes is an individual decision that you make with your health care provider. What should I know about hormone replacement therapy and supplements? Hormone therapy products are effective for treating symptoms that are associated with menopause, such as hot flashes and night sweats. Hormone replacement carries certain risks, especially as you become older. If you are thinking about using estrogen or estrogen with progestin treatments, discuss the benefits and risks with your health care provider. What should I know about heart disease and stroke? Heart disease, heart attack, and stroke become more likely as you age. This may be due, in part, to the hormonal changes that your body experiences during menopause. These can affect how your body processes dietary fats, triglycerides, and cholesterol. Heart attack and stroke are both medical emergencies. There are many things that you can do to help prevent heart disease  and stroke:  Have your blood pressure checked at least every 1-2 years. High blood pressure causes heart disease and increases the risk of stroke.  If you are 79-72 years old, ask your health care provider if you should take aspirin to prevent a heart attack or a stroke.  Do not use any tobacco products, including cigarettes, chewing tobacco, or electronic cigarettes. If you need help quitting, ask your health care provider.  It is important to eat a healthy diet and maintain a healthy weight. ? Be sure to include plenty of vegetables, fruits, low-fat dairy products, and lean protein. ? Avoid eating foods that are high in solid fats, added sugars, or salt (sodium).  Get regular exercise. This is one of the most important things that you can do for your health. ? Try to exercise for at least 150 minutes each week. The type of exercise that you do should increase your heart rate and make you sweat. This is known as moderate-intensity exercise. ? Try to do strengthening exercises at least twice each week. Do these in addition to the moderate-intensity exercise.  Know your numbers.Ask your health care provider to check your cholesterol and your blood glucose. Continue to have your blood tested as directed by your health care provider.  What should I know about cancer screening? There are several types of cancer. Take the following steps to reduce your risk and to catch any cancer development as early as possible. Breast Cancer  Practice breast self-awareness. ? This means understanding how your breasts normally appear and feel. ? It also means doing regular breast self-exams. Let your health care provider know about any changes, no matter how small.  If you are 40 or  older, have a clinician do a breast exam (clinical breast exam or CBE) every year. Depending on your age, family history, and medical history, it may be recommended that you also have a yearly breast X-ray (mammogram).  If you  have a family history of breast cancer, talk with your health care provider about genetic screening.  If you are at high risk for breast cancer, talk with your health care provider about having an MRI and a mammogram every year.  Breast cancer (BRCA) gene test is recommended for women who have family members with BRCA-related cancers. Results of the assessment will determine the need for genetic counseling and BRCA1 and for BRCA2 testing. BRCA-related cancers include these types: ? Breast. This occurs in males or females. ? Ovarian. ? Tubal. This may also be called fallopian tube cancer. ? Cancer of the abdominal or pelvic lining (peritoneal cancer). ? Prostate. ? Pancreatic. Cervical, Uterine, and Ovarian Cancer Your health care provider may recommend that you be screened regularly for cancer of the pelvic organs. These include your ovaries, uterus, and vagina. This screening involves a pelvic exam, which includes checking for microscopic changes to the surface of your cervix (Pap test).  For women ages 21-65, health care providers may recommend a pelvic exam and a Pap test every three years. For women ages 39-65, they may recommend the Pap test and pelvic exam, combined with testing for human papilloma virus (HPV), every five years. Some types of HPV increase your risk of cervical cancer. Testing for HPV may also be done on women of any age who have unclear Pap test results.  Other health care providers may not recommend any screening for nonpregnant women who are considered low risk for pelvic cancer and have no symptoms. Ask your health care provider if a screening pelvic exam is right for you.  If you have had past treatment for cervical cancer or a condition that could lead to cancer, you need Pap tests and screening for cancer for at least 20 years after your treatment. If Pap tests have been discontinued for you, your risk factors (such as having a new sexual partner) need to be reassessed  to determine if you should start having screenings again. Some women have medical problems that increase the chance of getting cervical cancer. In these cases, your health care provider may recommend that you have screening and Pap tests more often.  If you have a family history of uterine cancer or ovarian cancer, talk with your health care provider about genetic screening.  If you have vaginal bleeding after reaching menopause, tell your health care provider.  There are currently no reliable tests available to screen for ovarian cancer. Lung Cancer Lung cancer screening is recommended for adults 57-50 years old who are at high risk for lung cancer because of a history of smoking. A yearly low-dose CT scan of the lungs is recommended if you:  Currently smoke.  Have a history of at least 30 pack-years of smoking and you currently smoke or have quit within the past 15 years. A pack-year is smoking an average of one pack of cigarettes per day for one year. Yearly screening should:  Continue until it has been 15 years since you quit.  Stop if you develop a health problem that would prevent you from having lung cancer treatment. Colorectal Cancer  This type of cancer can be detected and can often be prevented.  Routine colorectal cancer screening usually begins at age 12 and continues through  age 63.  If you have risk factors for colon cancer, your health care provider may recommend that you be screened at an earlier age.  If you have a family history of colorectal cancer, talk with your health care provider about genetic screening.  Your health care provider may also recommend using home test kits to check for hidden blood in your stool.  A small camera at the end of a tube can be used to examine your colon directly (sigmoidoscopy or colonoscopy). This is done to check for the earliest forms of colorectal cancer.  Direct examination of the colon should be repeated every 5-10 years until  age 75. However, if early forms of precancerous polyps or small growths are found or if you have a family history or genetic risk for colorectal cancer, you may need to be screened more often. Skin Cancer  Check your skin from head to toe regularly.  Monitor any moles. Be sure to tell your health care provider: ? About any new moles or changes in moles, especially if there is a change in a mole's shape or color. ? If you have a mole that is larger than the size of a pencil eraser.  If any of your family members has a history of skin cancer, especially at a young age, talk with your health care provider about genetic screening.  Always use sunscreen. Apply sunscreen liberally and repeatedly throughout the day.  Whenever you are outside, protect yourself by wearing long sleeves, pants, a wide-brimmed hat, and sunglasses. What should I know about osteoporosis? Osteoporosis is a condition in which bone destruction happens more quickly than new bone creation. After menopause, you may be at an increased risk for osteoporosis. To help prevent osteoporosis or the bone fractures that can happen because of osteoporosis, the following is recommended:  If you are 59-59 years old, get at least 1,000 mg of calcium and at least 600 mg of vitamin D per day.  If you are older than age 36 but younger than age 32, get at least 1,200 mg of calcium and at least 600 mg of vitamin D per day.  If you are older than age 47, get at least 1,200 mg of calcium and at least 800 mg of vitamin D per day. Smoking and excessive alcohol intake increase the risk of osteoporosis. Eat foods that are rich in calcium and vitamin D, and do weight-bearing exercises several times each week as directed by your health care provider. What should I know about how menopause affects my mental health? Depression may occur at any age, but it is more common as you become older. Common symptoms of depression include:  Low or sad mood.   Changes in sleep patterns.  Changes in appetite or eating patterns.  Feeling an overall lack of motivation or enjoyment of activities that you previously enjoyed.  Frequent crying spells. Talk with your health care provider if you think that you are experiencing depression. What should I know about immunizations? It is important that you get and maintain your immunizations. These include:  Tetanus, diphtheria, and pertussis (Tdap) booster vaccine.  Influenza every year before the flu season begins.  Pneumonia vaccine.  Shingles vaccine. Your health care provider may also recommend other immunizations. This information is not intended to replace advice given to you by your health care provider. Make sure you discuss any questions you have with your health care provider. Document Released: 08/11/2005 Document Revised: 01/07/2016 Document Reviewed: 03/23/2015 Elsevier Interactive Patient Education  2019 [place].

## 2018-12-20 NOTE — Progress Notes (Signed)
Subjective:    Patient ID: Allison Brennan, female    DOB: [birth year], 53 y.o.   MRN: [id number]  HPI  Pt presents to the clinic today for her annual exam. She is also due to follow up chronic conditions.  HTN: Her BP today is 142/82. She is not taking Amlodipine because she ran out. ECG from 04/2016 reviewed.  HLD: Her last LDL was 136, 12/2017. She is not taking her Pravastatin or Fish Oil because she ran out. She does not consume a low fat diet.  COPD: She reports chronic cough but no shortness of breath. She can not afford Trelegy. She uses Albuterol as needed. There are no PFT's on file. She does continue to smoke.   Flu: never Tetanus: > 10 years ago Pneumovax: 12/2011 Pap Smear: partial hysterectomy, 1999 Mammogram: 05/2016 Colon Screening: > 10 years ago Vision Screening: as needed Dentist: as needed  Diet: She does eat meat. She consumes some fruits and veggies. She does eat fried foods. She drinks mostly Mt. Dew Exercise: None  Review of Systems  Past Medical History:  Diagnosis Date  . Back pain, lumbosacral   . COPD (chronic obstructive pulmonary disease) (HCC)    no pulmonologist  . HYQMVHQI(696.2Headache(784.0)   . Hypertension   . PPD positive, treated 1998    Current Outpatient Medications  Medication Sig Dispense Refill  . albuterol (VENTOLIN HFA) 108 (90 Base) MCG/ACT inhaler Inhale 2 puffs into the lungs every 4 (four) hours as needed for wheezing or shortness of breath. (Patient not taking: Reported on 12/20/2018) 18 g 0  . amLODipine (NORVASC) 5 MG tablet Take 1 tablet (5 mg total) by mouth daily. (Patient not taking: Reported on 12/20/2018) 30 tablet 0  . fluticasone (FLONASE) 50 MCG/ACT nasal spray Place 2 sprays into both nostrils daily. (Patient not taking: Reported on 12/20/2018) 16 g 2  . Fluticasone-Umeclidin-Vilant (TRELEGY ELLIPTA) 100-62.5-25 MCG/INH AEPB Inhale 1 puff into the lungs daily. (Patient not taking: Reported on 12/20/2018) 1 each 11  . naproxen  (NAPROSYN) 375 MG tablet Take 1 tablet (375 mg total) by mouth 2 (two) times daily with a meal. (Patient not taking: Reported on 12/20/2018) 30 tablet 0  . Omega-3 Fatty Acids (FISH OIL) 1000 MG CAPS Take 1 capsule by mouth daily.    . pravastatin (PRAVACHOL) 40 MG tablet Take 1 tablet (40 mg total) by mouth daily. (Patient not taking: Reported on 12/20/2018) 90 tablet 0   No current facility-administered medications for this visit.     Allergies  Allergen Reactions  . Lisinopril Cough    Family History  Problem Relation Age of Onset  . Diverticulitis Mother   . Deep vein thrombosis Mother   . Arthritis Maternal Grandmother   . Osteoporosis Maternal Grandmother   . Cancer Maternal Aunt        brain  . Breast cancer Neg Hx     Social History   Socioeconomic History  . Marital status: Widowed    Spouse name: Not on file  . Number of children: 2  . Years of education: Not on file  . Highest education level: Not on file  Occupational History  . 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 Financial resource strain: Not on file  . Food insecurity    Worry: Not on file    Inability: Not on file  . Transportation needs    Medical: Not on file    Non-medical: Not on  file  Tobacco Use  . Smoking status: Current Every Day Smoker    Packs/day: 1.00    Years: 28.00    Pack years: 28.00  . Smokeless tobacco: Never Used  . Tobacco comment: Decline Cessation information/SLS  Substance and Sexual Activity  . Alcohol use: Yes    Alcohol/week: 2.0 standard drinks    Types: 2 Cans of beer per week    Comment: 1-2 drinks beer every day  . Drug use: No  . Sexual activity: Not Currently  Lifestyle  . Physical activity    Days per week: Not on file    Minutes per session: Not on file  . Stress: Not on file  Relationships  . Social Musicianconnections    Talks on phone: Not on file    Gets together: Not on file    Attends religious service: Not on file    Active  member of club or organization: Not on file    Attends meetings of clubs or organizations: Not on file    Relationship status: Not on file  . Intimate partner violence    Fear of current or ex partner: Not on file    Emotionally abused: Not on file    Physically abused: Not on file    Forced sexual activity: Not on file  Other Topics Concern  . Not on file  Social History Narrative   [place], no pets. Both children living with her.       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       Regular Exercise -  NO   Daily Caffeine Use:  6 - 8 sodas   Diet-     Constitutional: Denies fever, malaise, fatigue, headache or abrupt weight changes.  HEENT: Denies eye pain, eye redness, ear pain, ringing in the ears, wax buildup, runny nose, nasal congestion, bloody nose, or sore throat. Respiratory: Pt reports chronic cough. Denies difficulty breathing, shortness of breath, or sputum production.   Cardiovascular: Denies chest pain, chest tightness, palpitations or swelling in the hands or feet.  Gastrointestinal: Denies abdominal pain, bloating, constipation, diarrhea or blood in the stool.  GU: Pt reports painful intercourse. Denies urgency, frequency, pain with urination, burning sensation, blood in urine, odor or discharge. Musculoskeletal: Pt reports intermittent low back pain. Denies decrease in range of motion, difficulty with gait, or joint swelling.  Skin: Denies redness, rashes, lesions or ulcercations.  Neurological: Denies dizziness, difficulty with memory, difficulty with speech or problems with balance and coordination.  Psych: Denies anxiety, depression, SI/HI.  No other specific complaints in a complete review of systems (except as listed in HPI above).     Objective:   Physical Exam  BP (!) 142/82 (BP Location: Right Arm, Patient Position: Sitting, Cuff Size: Normal)   Pulse 86   Temp 98.3 F (36.8 C) (Temporal)   Ht 5' 3.75" (1.619 m)   Wt 184 lb 8 oz (83.7  kg)   SpO2 91%   BMI 31.92 kg/m   Wt Readings from Last 3 Encounters:  12/20/18 184 lb 8 oz (83.7 kg)  12/24/17 183 lb (83 kg)  09/10/17 178 lb (80.7 kg)    General: Appears her stated age, obese in NAD. Skin: Warm, dry and intact. Dog scratch noted to right hand. HEENT: Head: normal shape and size; Eyes: sclera white, no icterus, conjunctiva pink, PERRLA and EOMs intact; Ears: Tm's gray and intact, normal light reflex;  Neck:  Neck supple, trachea midline. No masses, lumps  or thyromegaly present.  Cardiovascular: Normal rate and rhythm. S1,S2 noted.  No murmur, rubs or gallops noted. No JVD or BLE edema. No carotid bruits noted. Pulmonary/Chest: Normal effort and positive vesicular breath sounds. No respiratory distress. No wheezes, rales or ronchi noted.  Abdomen: Soft and nontender. Normal bowel sounds. No distention or masses noted. Liver, spleen and kidneys non palpable. Musculoskeletal: Strength 5/5 BUE/BLE. No difficulty with gait.  Neurological: Alert and oriented. Cranial nerves II-XII grossly intact. Coordination normal.  Psychiatric: Mood and affect normal. Behavior is normal. Judgment and thought content normal.     BMET    Component Value Date/Time   NA 136 09/10/2017 1510   K 4.0 09/10/2017 1510   CL 100 09/10/2017 1510   CO2 30 09/10/2017 1510   GLUCOSE 88 09/10/2017 1510   BUN 13 09/10/2017 1510   CREATININE 0.78 09/10/2017 1510   CREATININE 0.60 05/29/2012 1521   CALCIUM 9.8 09/10/2017 1510   GFRNONAA >60 04/18/2016 1046   GFRAA >60 04/18/2016 1046    Lipid Panel     Component Value Date/Time   CHOL 227 (H) 09/10/2017 1510   TRIG (H) 09/10/2017 1510    413.0 Triglyceride is over 400; calculations on Lipids are invalid.   HDL 42.50 09/10/2017 1510   CHOLHDL 5 09/10/2017 1510   VLDL 77.4 (H) 04/10/2016 0812   LDLCALC 75 12/15/2011 0936    CBC    Component Value Date/Time   WBC 8.0 04/18/2016 1046   RBC 4.81 04/18/2016 1046   HGB 15.5 04/18/2016  1046   HCT 44.5 04/18/2016 1046   PLT 217 04/18/2016 1046   MCV 92.7 04/18/2016 1046   MCH 32.3 04/18/2016 1046   MCHC 34.9 04/18/2016 1046   RDW 13.9 04/18/2016 1046   LYMPHSABS 2.1 04/10/2016 0812   MONOABS 0.6 04/10/2016 0812   EOSABS 0.3 04/10/2016 0812   BASOSABS 0.1 04/10/2016 0812    Hgb A1C No results found for: HGBA1C          Assessment & Plan:   Preventative Health Maintenance:  Encouraged her to get a flu shot in the fall Tdap today Pneumovax today She declines pelvic exam Mammogram ordered, she will call [place] to schedule She declines colon cancer screening at this time Encouraged her to consume a balanced diet and exercise regimen Advised him to see an eye doctor and dentist annually Will check CBC, CMET, Lipid, A1C and Vit D today  RTC in 3 weeks for BP check Webb Silversmith, NP

## 2018-12-21 LAB — CBC
HCT: 44.6 % (ref 35.0–45.0)
Hemoglobin: 15.3 g/dL (ref 11.7–15.5)
MCH: 31.1 pg (ref 27.0–33.0)
MCHC: 34.3 g/dL (ref 32.0–36.0)
MCV: 90.7 fL (ref 80.0–100.0)
MPV: 10.9 fL (ref 7.5–12.5)
Platelets: 242 10*3/uL (ref 140–400)
RBC: 4.92 10*6/uL (ref 3.80–5.10)
RDW: 12.8 % (ref 11.0–15.0)
WBC: 7.8 10*3/uL (ref 3.8–10.8)

## 2018-12-21 LAB — COMPLETE METABOLIC PANEL WITH GFR
AG Ratio: 1.4 (calc) (ref 1.0–2.5)
ALT: 21 U/L (ref 6–29)
AST: 22 U/L (ref 10–35)
Albumin: 4.1 g/dL (ref 3.6–5.1)
Alkaline phosphatase (APISO): 88 U/L (ref 37–153)
BUN: 7 mg/dL (ref 7–25)
CO2: 24 mmol/L (ref 20–32)
Calcium: 9.5 mg/dL (ref 8.6–10.4)
Chloride: 105 mmol/L (ref 98–110)
Creat: 0.77 mg/dL (ref 0.50–1.05)
GFR, Est African American: 101 mL/min/{1.73_m2} (ref >=60)
GFR, Est Non African American: 88 mL/min/{1.73_m2} (ref >=60)
Globulin: 3 g/dL (calc) (ref 1.9–3.7)
Glucose, Bld: 78 mg/dL (ref 65–99)
Potassium: 4.2 mmol/L (ref 3.5–5.3)
Sodium: 142 mmol/L (ref 135–146)
Total Bilirubin: 0.3 mg/dL (ref 0.2–1.2)
Total Protein: 7.1 g/dL (ref 6.1–8.1)

## 2018-12-21 LAB — LIPID PANEL
Cholesterol: 215 mg/dL — ABNORMAL HIGH (ref <200)
HDL: 30 mg/dL — ABNORMAL LOW (ref >=50)
Non-HDL Cholesterol (Calc): 185 mg/dL (calc) — ABNORMAL HIGH (ref <130)
Total CHOL/HDL Ratio: 7.2 (calc) — ABNORMAL HIGH (ref <5.0)
Triglycerides: 567 mg/dL — ABNORMAL HIGH (ref <150)

## 2018-12-21 LAB — HEMOGLOBIN A1C
Hgb A1c MFr Bld: 5.4 % of total Hgb (ref <5.7)
Mean Plasma Glucose: 108 (calc)
eAG (mmol/L): 6 (calc)

## 2018-12-21 LAB — VITAMIN D 25 HYDROXY (VIT D DEFICIENCY, FRACTURES): Vit D, 25-Hydroxy: 6 ng/mL — ABNORMAL LOW (ref 30–100)

## 2018-12-22 ENCOUNTER — Other Ambulatory Visit: Payer: Self-pay | Admitting: Internal Medicine

## 2018-12-22 ENCOUNTER — Encounter: Payer: Self-pay | Admitting: Internal Medicine

## 2018-12-22 DIAGNOSIS — M25561 Pain in right knee: Secondary | ICD-10-CM

## 2018-12-22 DIAGNOSIS — M25461 Effusion, right knee: Secondary | ICD-10-CM

## 2018-12-22 MED ORDER — VITAMIN D (ERGOCALCIFEROL) 1.25 MG (50000 UNIT) PO CAPS: 50000.0000 [IU] | ORAL_CAPSULE | ORAL | 0 refills | Status: DC

## 2018-12-22 NOTE — Assessment & Plan Note (Signed)
Uncontrolled due to medication noncompliance Amlodipine refilled today CMET today Reinforced DASH diet, smoking cessation and exercise for weight loss

## 2018-12-22 NOTE — Assessment & Plan Note (Signed)
CMET and Lipid profile today Will see if she needs to restart Pravastatin Encouraged her to consume a low fat diet

## 2018-12-22 NOTE — Assessment & Plan Note (Signed)
Encouraged smoking cessation, she declines Will try Advair in place of Trelegy Albuterol refilled today Will monitor

## 2018-12-23 ENCOUNTER — Telehealth: Payer: Self-pay

## 2018-12-23 NOTE — Telephone Encounter (Signed)
[place] Night - Client TELEPHONE ADVICE RECORD [facility] Patient Name: Allison Brennan Gender: Female DOB: [birth year] Age: 53 Y 44 M 23 D Return Phone Number: [phone] (Primary) Address: City/State/Zip: [place] Client [place] Night - Client Client Site [place] Physician Webb Silversmith - NP Contact Type Call Who Is Calling Pharmacy Call Type Pharmacy Send to RN Chief Complaint Paging or Request for Consult Reason for Call Request to clarify medication order Initial Comment Caller states calling from [place]. Additional Comment Pharmacy Name [facility] Pharmacist Name [place] Number [phone] Translation No Nurse Assessment Nurse: Micki Riley, RN, Domenick Gong Date/Time Eilene Ghazi Time): 12/20/2018 5:09:56 PM Please select the assessment type ---Pharmacy clarification Additional Documentation ---Tommie Ard, pharmacist at [facility] [phone] states needs clarification on Primerin Vaginal cream written today by Np Golden Hurter. Written as apply twice weekly, but no specification of how many grams to apply, as applicator does measure application in grams. 12 refills currently written. Is there an on-call physician for the client? ---Yes Do the client directives allow paging the on call for medication concerns? ---Yes Document information that requires clarification. ---Primerin Vaginal cream written today by Ellamae Sia. Apply twice weekly, but no specification of how many grams to apply. Additional Documentation ---Will page md on call for clarification for this medication. Md on call, Dr. Deborra Medina states to relay to pharmacist to use default or standard application amount of Primerin Vaginal Cream. Pharmacist states will place medication on file "use as directed by physician', also states pharmacy doesn't have her insurance on file either. Guidelines Guideline Title  Affirmed Question Affirmed Notes Nurse Date/Time (Eastern Time) Disp. Time Eilene Ghazi Time) Disposition Final User 12/20/2018 5:20:07 PM Paged On Call back to [facility], RN, Domenick Gong 12/20/2018 5:30:28 PM Pharmacy Call Micki Riley, RN, Domenick Gong PLEASE NOTE: All timestamps contained within this report are represented as [place] Standard Time. CONFIDENTIALTY NOTICE: This fax transmission is intended only for the addressee. It contains information that is legally privileged, confidential or otherwise protected from use or disclosure. If you are not the intended recipient, you are strictly prohibited from reviewing, disclosing, copying using or disseminating any of this information or taking any action in reliance on or regarding this information. If you have received this fax in error, please notify us immediately by telephone so that we can arrange for its return to [place]. Phone: [phone], Toll-Free: [phone], Fax: [phone] Page: 2 of 2 Call Id: [id number] [place]. Time Eilene Ghazi Time) Disposition Final User Reason: See documentation. 12/20/2018 5:31:07 PM Clinical Call Yes Micki Riley, RN, [place] Phone DateTime Result/Outcome Message Type Notes Arnette Norris- MD [phone] 12/20/2018 5:20:07 PM Paged On Call Back to Call Center Doctor Paged Patient of Np Baity. Please callback to [phone]. thank you. Arnette Norris- MD 12/20/2018 5:29:36 PM Spoke with On Call - General Message Result Md on call with some difficulty with EPIC, states to have pharmacy use default or standard amount usually prescribed for application of Primerin Vaginal Rash Cream. Will relay this information to pharmacist. Md states if there is another different dose, will call this nurse.

## 2018-12-23 NOTE — Telephone Encounter (Signed)
Looks like this was addressed by Dr. Deborra Medina

## 2018-12-26 ENCOUNTER — Encounter: Payer: Self-pay | Admitting: Internal Medicine

## 2019-01-01 ENCOUNTER — Emergency Department: Payer: Worker's Compensation

## 2019-01-01 ENCOUNTER — Emergency Department
Admission: EM | Admit: 2019-01-01 | Discharge: 2019-01-01 | Disposition: A | Discharge status: Home / Self Care | Payer: Worker's Compensation | Attending: Emergency Medicine | Admitting: Emergency Medicine

## 2019-01-01 ENCOUNTER — Encounter: Payer: Self-pay | Admitting: Emergency Medicine

## 2019-01-01 ENCOUNTER — Other Ambulatory Visit: Payer: Self-pay

## 2019-01-01 DIAGNOSIS — Z79899 Other long term (current) drug therapy: Secondary | ICD-10-CM | POA: Insufficient documentation

## 2019-01-01 DIAGNOSIS — J449 Chronic obstructive pulmonary disease, unspecified: Secondary | ICD-10-CM | POA: Diagnosis not present

## 2019-01-01 DIAGNOSIS — F17200 Nicotine dependence, unspecified, uncomplicated: Secondary | ICD-10-CM | POA: Insufficient documentation

## 2019-01-01 DIAGNOSIS — I10 Essential (primary) hypertension: Secondary | ICD-10-CM | POA: Insufficient documentation

## 2019-01-01 DIAGNOSIS — M25561 Pain in right knee: Secondary | ICD-10-CM | POA: Diagnosis not present

## 2019-01-01 MED ORDER — MELOXICAM 15 MG PO TABS: 15.0000 mg | ORAL_TABLET | Freq: Every day | ORAL | 1 refills | Status: AC

## 2019-01-01 MED ORDER — MELOXICAM 7.5 MG PO TABS
15.0000 mg | ORAL_TABLET | Freq: Once | ORAL | Status: AC
  Administered 2019-01-01: 15 mg via ORAL
  Filled 2019-01-01: qty 2

## 2019-01-01 NOTE — ED Triage Notes (Signed)
States she fell on Monday  Having pain to right knee and abrasion to right elbow

## 2019-01-01 NOTE — ED Provider Notes (Signed)
[facility] Emergency Department Provider Note  ____________________________________________  Time seen: Approximately 5:28 PM  I have reviewed the triage vital signs and the nursing notes.   HISTORY  Chief Complaint Fall    HPI Fuller Songngela R Visscher is a 53 y.o. female presents to the emergency department with 8 out of 10 right knee pain that is aching in nature, worse with movement and relieved with rest for the past 2 days.  Patient reports that she fell at work.  Did not hit her head or her neck.  She states that she has been able to ambulate with some pain.  No numbness or tingling of the right lower extremity.  No abrasions or lacerations.  No alleviating medications have been attempted at home.        Past Medical History:  Diagnosis Date  . Back pain, lumbosacral   . COPD (chronic obstructive pulmonary disease) (HCC)    no pulmonologist  . VWUJWJXB(147.8Headache(784.0)   . Hypertension   . PPD positive, treated 1998    Patient Active Problem List   Diagnosis Date Noted  . HLD (hyperlipidemia) 09/10/2017  . Lumbago 11/13/2011  . Essential hypertension 08/29/2011  . COPD (chronic obstructive pulmonary disease) (HCC) 08/29/2011    Past Surgical History:  Procedure Laterality Date  . CESAREAN SECTION     x2  . CLOSED REDUCTION FINGER WITH PERCUTANEOUS PINNING Left 06/17/2015   Procedure: CLOSED REDUCTION FINGER WITH PERCUTANEOUS PINNING little finger;  Surgeon: Kennedy BuckerMichael Menz, MD;  Location: [facility] ORS;  Service: Orthopedics;  Laterality: Left;  . EXOSTECTECTOMY TOE Right 02/12/2015   Procedure: EXOSTECTECTOMY TOE;  Surgeon: Linus Galasodd Cline, MD;  Location: [facility] ORS;  Service: Podiatry;  Laterality: Right;  . KNEE ARTHROSCOPY  03/13/2012   Procedure: ARTHROSCOPY KNEE;  Surgeon: Loanne DrillingFrank V Aluisio, MD;  Location: [facility] ORS;  Service: Orthopedics;  Laterality: Right;  debridementt right medial meniscus/chrondroplasty  . PARTIAL HYSTERECTOMY  1999   pelvic pain    Prior to  Admission medications   Medication Sig Start Date End Date Taking? Authorizing Provider  albuterol (VENTOLIN HFA) 108 (90 Base) MCG/ACT inhaler Inhale 2 puffs into the lungs every 4 (four) hours as needed for wheezing or shortness of breath. 12/20/18   Lorre MunroeBaity, Regina W, NP  amLODipine (NORVASC) 5 MG tablet Take 1 tablet (5 mg total) by mouth daily. 12/20/18   Lorre MunroeBaity, Regina W, NP  conjugated estrogens (PREMARIN) vaginal cream Place 1 Applicatorful vaginally 2 (two) times a week. 12/23/18   Lorre MunroeBaity, Regina W, NP  fluticasone (FLONASE) 50 MCG/ACT nasal spray Place 2 sprays into both nostrils daily. 12/20/18   Lorre MunroeBaity, Regina W, NP  Fluticasone-Salmeterol (ADVAIR) 100-50 MCG/DOSE AEPB Inhale 1 puff into the lungs 2 (two) times daily. 12/20/18   Lorre MunroeBaity, Regina W, NP  meloxicam (MOBIC) 15 MG tablet Take 1 tablet (15 mg total) by mouth daily for 7 days. 01/01/19 01/08/19  Orvil FeilWoods, Marigrace Mccole M, PA-C  Omega-3 Fatty Acids (FISH OIL) 1000 MG CAPS Take 1 capsule by mouth daily.    [provider]  Vitamin D, Ergocalciferol, (DRISDOL) 1.25 MG (50000 UT) CAPS capsule Take 1 capsule (50,000 Units total) by mouth every 7 (seven) days. 12/22/18   Lorre MunroeBaity, Regina W, NP    Allergies Lisinopril  Family History  Problem Relation Age of Onset  . Diverticulitis Mother   . Deep vein thrombosis Mother   . Arthritis Maternal Grandmother   . Osteoporosis Maternal Grandmother   . Cancer Maternal Aunt  brain  . Breast cancer Neg Hx     Social History Social History   Tobacco Use  . Smoking status: Current Every Day Smoker    Packs/day: 1.00    Years: 28.00    Pack years: 28.00  . Smokeless tobacco: Never Used  . Tobacco comment: Decline Cessation information/SLS  Substance Use Topics  . Alcohol use: Yes    Alcohol/week: 2.0 standard drinks    Types: 2 Cans of beer per week    Comment: 1-2 drinks beer every day  . Drug use: No     Review of Systems  Constitutional: No fever/chills Eyes: No visual changes.  No discharge ENT: No upper respiratory complaints. Cardiovascular: no chest pain. Respiratory: no cough. No SOB. Gastrointestinal: No abdominal pain.  No nausea, no vomiting.  No diarrhea.  No constipation. Musculoskeletal: Patient has right knee pain.  Skin: Negative for rash, abrasions, lacerations, ecchymosis. Neurological: Negative for headaches, focal weakness or numbness.   ____________________________________________   PHYSICAL EXAM:  VITAL SIGNS: ED Triage Vitals  Enc Vitals Group     BP 01/01/19 1619 128/72     Pulse Rate 01/01/19 1619 81     Resp 01/01/19 1619 20     Temp 01/01/19 1619 98 F (36.7 C)     Temp Source 01/01/19 1619 Oral     SpO2 01/01/19 1619 94 %     Weight 01/01/19 1619 184 lb (83.5 kg)     Height 01/01/19 1619 5' 3.75" (1.619 m)     Head Circumference --      Peak Flow --      Pain Score 01/01/19 1624 6     Pain Loc --      Pain Edu? --      Excl. in [place]? --      Constitutional: Alert and oriented. Well appearing and in no acute distress. Eyes: Conjunctivae are normal. PERRL. EOMI. Head: Atraumatic. Cardiovascular: Normal rate, regular rhythm. Normal S1 and S2.  Good peripheral circulation. Respiratory: Normal respiratory effort without tachypnea or retractions. Lungs CTAB. Good air entry to the bases with no decreased or absent breath sounds. Gastrointestinal: Bowel sounds 4 quadrants. Soft and nontender to palpation. No guarding or rigidity. No palpable masses. No distention. No CVA tenderness. Musculoskeletal: Full range of motion to all extremities. No gross deformities appreciated.  Patient has loss of peripatellar dimpling on the right.  She is able to perform full range of motion of the right knee.  She has tenderness to palpation along the medial compartment of the right knee.  Positive ballottement.  Palpable dorsalis pedis pulse, right Neurologic:  Normal speech and language. No gross focal neurologic deficits are appreciated.  Skin:   Skin is warm, dry and intact. No rash noted. Psychiatric: Mood and affect are normal. Speech and behavior are normal. Patient exhibits appropriate insight and judgement.   ____________________________________________   LABS (all labs ordered are listed, but only abnormal results are displayed)  Labs Reviewed - No data to display ____________________________________________  EKG   ____________________________________________  RADIOLOGY I personally viewed and evaluated these images as part of my medical decision making, as well as reviewing the written report by the radiologist.  Dg Knee Complete 4 Views Right  Result Date: 01/01/2019 CLINICAL DATA:  [place] Monday.  Persistent knee pain. EXAM: RIGHT KNEE - COMPLETE 4+ VIEW COMPARISON:  12/24/2017 FINDINGS: Stable, age advanced, tricompartmental degenerative changes, most significant in the medial compartment with joint space narrowing and spurring. No acute fracture  or osteochondral lesion. There is a moderate-sized joint effusion. IMPRESSION: 1. Stable tricompartmental degenerative changes. 2. No acute fracture or osteochondral lesion. 3. Moderate-sized joint effusion. Electronically Signed   By: Rudie MeyerP.  Gallerani M.D.   On: 01/01/2019 16:55    ____________________________________________    PROCEDURES  Procedure(s) performed:    Procedures    Medications  meloxicam (MOBIC) tablet 15 mg (has no administration in time range)     ____________________________________________   INITIAL IMPRESSION / ASSESSMENT AND PLAN / ED COURSE  Pertinent labs & imaging results that were available during my care of the patient were reviewed by me and considered in my medical decision making (see chart for details).  Review of the [place] CSRS was performed in accordance of the [facility] prior to dispensing any controlled drugs.        Assessment and Plan:  Right Knee Pain:  Patient presents to the emergency department with acute right knee pain  for the past 2 days after a fall.  Vital signs were stable.  On physical exam, patient had tenderness to palpation over the medial compartment of the knee.  She had loss of peripatellar dimpling and effusion was evident by a positive ballottement.  Differential diagnosis included meniscal tear, ACL tear, fracture.  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  Patient was discharged with meloxicam after she assured me that he has no prior history of GI bleeds and tolerates anti-inflammatories well.  Patient was referred to orthopedics, Dr. Rosita KeaMenz.  All patient questions were answered.  ____________________________________________  FINAL CLINICAL IMPRESSION(S) / ED DIAGNOSES  Final diagnoses:  Acute pain of right knee      NEW MEDICATIONS STARTED DURING THIS VISIT:  ED Discharge Orders         Ordered    meloxicam (MOBIC) 15 MG tablet  Daily     01/01/19 1726              This chart was dictated using voice recognition software/Dragon. Despite best efforts to proofread, errors can occur which can change the meaning. Any change was purely unintentional.    Orvil FeilWoods, Eydan Chianese M, PA-C 01/01/19 1738    Sharyn CreamerQuale, Mark, MD 01/02/19 [phone]

## 2019-01-13 ENCOUNTER — Ambulatory Visit: Payer: BC Managed Care – PPO | Admitting: Internal Medicine

## 2019-01-13 DIAGNOSIS — Z0289 Encounter for other administrative examinations: Secondary | ICD-10-CM

## 2019-01-17 ENCOUNTER — Emergency Department
Admission: EM | Admit: 2019-01-17 | Discharge: 2019-01-18 | Disposition: A | Discharge status: Home / Self Care | Payer: BC Managed Care – PPO | Attending: Emergency Medicine | Admitting: Emergency Medicine

## 2019-01-17 ENCOUNTER — Emergency Department: Payer: BC Managed Care – PPO

## 2019-01-17 ENCOUNTER — Encounter: Payer: Self-pay | Admitting: *Deleted

## 2019-01-17 ENCOUNTER — Telehealth: Payer: Self-pay

## 2019-01-17 ENCOUNTER — Other Ambulatory Visit: Payer: Self-pay

## 2019-01-17 DIAGNOSIS — J449 Chronic obstructive pulmonary disease, unspecified: Secondary | ICD-10-CM | POA: Diagnosis not present

## 2019-01-17 DIAGNOSIS — F17210 Nicotine dependence, cigarettes, uncomplicated: Secondary | ICD-10-CM | POA: Diagnosis not present

## 2019-01-17 DIAGNOSIS — R1010 Upper abdominal pain, unspecified: Secondary | ICD-10-CM | POA: Insufficient documentation

## 2019-01-17 DIAGNOSIS — Z79899 Other long term (current) drug therapy: Secondary | ICD-10-CM | POA: Insufficient documentation

## 2019-01-17 DIAGNOSIS — I10 Essential (primary) hypertension: Secondary | ICD-10-CM | POA: Diagnosis not present

## 2019-01-17 DIAGNOSIS — R091 Pleurisy: Secondary | ICD-10-CM

## 2019-01-17 DIAGNOSIS — R071 Chest pain on breathing: Secondary | ICD-10-CM | POA: Insufficient documentation

## 2019-01-17 DIAGNOSIS — R109 Unspecified abdominal pain: Secondary | ICD-10-CM | POA: Diagnosis not present

## 2019-01-17 DIAGNOSIS — R05 Cough: Secondary | ICD-10-CM | POA: Insufficient documentation

## 2019-01-17 DIAGNOSIS — R079 Chest pain, unspecified: Secondary | ICD-10-CM | POA: Diagnosis not present

## 2019-01-17 DIAGNOSIS — K760 Fatty (change of) liver, not elsewhere classified: Secondary | ICD-10-CM | POA: Diagnosis not present

## 2019-01-17 DIAGNOSIS — R110 Nausea: Secondary | ICD-10-CM | POA: Insufficient documentation

## 2019-01-17 DIAGNOSIS — R059 Cough, unspecified: Secondary | ICD-10-CM

## 2019-01-17 LAB — COMPREHENSIVE METABOLIC PANEL
ALT: 24 U/L (ref 0–44)
AST: 22 U/L (ref 15–41)
Albumin: 4.3 g/dL (ref 3.5–5.0)
Alkaline Phosphatase: 87 U/L (ref 38–126)
Anion gap: 9 (ref 5–15)
BUN: 8 mg/dL (ref 6–20)
CO2: 27 mmol/L (ref 22–32)
Calcium: 9.6 mg/dL (ref 8.9–10.3)
Chloride: 103 mmol/L (ref 98–111)
Creatinine, Ser: 0.54 mg/dL (ref 0.44–1.00)
GFR calc Af Amer: >60 mL/min (ref >60)
GFR calc non Af Amer: >60 mL/min (ref >60)
Glucose, Bld: 100 mg/dL — ABNORMAL HIGH (ref 70–99)
Potassium: 4.1 mmol/L (ref 3.5–5.1)
Sodium: 139 mmol/L (ref 135–145)
Total Bilirubin: 0.4 mg/dL (ref 0.3–1.2)
Total Protein: 8.2 g/dL — ABNORMAL HIGH (ref 6.5–8.1)

## 2019-01-17 LAB — CBC
HCT: 46.1 % — ABNORMAL HIGH (ref 36.0–46.0)
Hemoglobin: 15.1 g/dL — ABNORMAL HIGH (ref 12.0–15.0)
MCH: 30.8 pg (ref 26.0–34.0)
MCHC: 32.8 g/dL (ref 30.0–36.0)
MCV: 94.1 fL (ref 80.0–100.0)
Platelets: 244 10*3/uL (ref 150–400)
RBC: 4.9 MIL/uL (ref 3.87–5.11)
RDW: 13 % (ref 11.5–15.5)
WBC: 10.5 10*3/uL (ref 4.0–10.5)
nRBC: 0 % (ref 0.0–0.2)

## 2019-01-17 LAB — URINALYSIS, COMPLETE (UACMP) WITH MICROSCOPIC
Bacteria, UA: NONE SEEN
Bilirubin Urine: NEGATIVE
Glucose, UA: NEGATIVE mg/dL
Hgb urine dipstick: NEGATIVE
Ketones, ur: NEGATIVE mg/dL
Leukocytes,Ua: NEGATIVE
Nitrite: NEGATIVE
Protein, ur: NEGATIVE mg/dL
Specific Gravity, Urine: 1.006 (ref 1.005–1.030)
pH: 7 (ref 5.0–8.0)

## 2019-01-17 LAB — TROPONIN I (HIGH SENSITIVITY): Troponin I (High Sensitivity): 4 ng/L (ref <18)

## 2019-01-17 LAB — LIPASE, BLOOD: Lipase: 46 U/L (ref 11–51)

## 2019-01-17 MED ORDER — FENTANYL CITRATE (PF) 100 MCG/2ML IJ SOLN
50.0000 ug | Freq: Once | INTRAMUSCULAR | Status: AC
  Administered 2019-01-17: 50 ug via INTRAVENOUS
  Filled 2019-01-17: qty 2

## 2019-01-17 MED ORDER — ONDANSETRON HCL 4 MG/2ML IJ SOLN
4.0000 mg | Freq: Once | INTRAMUSCULAR | Status: AC
  Administered 2019-01-17: 4 mg via INTRAVENOUS
  Filled 2019-01-17: qty 2

## 2019-01-17 MED ORDER — SODIUM CHLORIDE 0.9 % IV BOLUS
1000.0000 mL | Freq: Once | INTRAVENOUS | Status: AC
  Administered 2019-01-17: 1000 mL via INTRAVENOUS

## 2019-01-17 MED ORDER — SODIUM CHLORIDE 0.9% FLUSH: 3.0000 mL | Freq: Once | INTRAVENOUS | Status: DC

## 2019-01-17 MED ORDER — IOHEXOL 350 MG/ML SOLN
75.0000 mL | Freq: Once | INTRAVENOUS | Status: AC | PRN
  Administered 2019-01-18: 75 mL via INTRAVENOUS

## 2019-01-17 NOTE — Telephone Encounter (Signed)
Per DRP left detailed v/m requesting cb in re; to request sent by mychart for an appt 01/24/19 -07/31/20for swelling in feet and legs. Was wanting to speak with pt to see if any SOB,CP, redness,warmth or red streaks on lower extremities. I spoke with someone at pts work and she does not work on Fridays. I tried to call [place] per DPR but unable to reach her by phone. I spoke with pt and she said she saw ortho and was advised that since both lower legs and feet were swelling should get heart cked out. Pt said at end of day her socks have left an indention mid calf on both legs; pt stands on cement floor at work. Pt does not have CP, SOB, redness or warmth on lower extremities; then pt said had redness and swelling on one ankle on & off for 2 months. Swelling does go down overnight. Pt ? If she has fever; pt does not have way to ck temp. Pt said she is sitting in an A/C room with fans and she still feels hot. Pt has dry cough that at times is a prod cough with yellow phlegm.pt is a smoker. Pt has muscle pain in rt forearm for 1 wk. Pt also has H/A. No chills,SOB,diarrhea, and no loss of taste or smell. No travel and no known exposure to covid. Pt does not want to go to [facility] because she does not know how much will cost and pt will go to [facility] ED. FYI to Avie Echevaria NP.

## 2019-01-17 NOTE — ED Provider Notes (Signed)
[facility] Emergency Department Provider Note   ____________________________________________   First MD Initiated Contact with Patient 01/17/19 2327     (approximate)  I have reviewed the triage vital signs and the nursing notes.   HISTORY  Chief Complaint Abdominal Pain and Nausea    HPI Fuller Songngela R Mcfarlan is a 53 y.o. female who presents to the ED from home with a chief complaint of abdominal/chest pain.  Patient describes lower chest/upper abdominal pain and nausea for the past 6 days.  Worse when she takes a deep breath or moves.  Is not exacerbated by food.  Complains of nonproductive cough.  She is a smoker.  Denies fever, shortness of breath, vomiting, dysuria, constipation/diarrhea.  Last BM this afternoon which was normal for her.  Denies recent travel, trauma or hormone use.  Denies exposure to persons diagnosed with coronavirus.       Past Medical History:  Diagnosis Date  . Back pain, lumbosacral   . COPD (chronic obstructive pulmonary disease) (HCC)    no pulmonologist  . ZOXWRUEA(540.9Headache(784.0)   . Hypertension   . PPD positive, treated 1998    Patient Active Problem List   Diagnosis Date Noted  . HLD (hyperlipidemia) 09/10/2017  . Lumbago 11/13/2011  . Essential hypertension 08/29/2011  . COPD (chronic obstructive pulmonary disease) (HCC) 08/29/2011    Past Surgical History:  Procedure Laterality Date  . CESAREAN SECTION     x2  . CLOSED REDUCTION FINGER WITH PERCUTANEOUS PINNING Left 06/17/2015   Procedure: CLOSED REDUCTION FINGER WITH PERCUTANEOUS PINNING little finger;  Surgeon: Kennedy BuckerMichael Menz, MD;  Location: [facility] ORS;  Service: Orthopedics;  Laterality: Left;  . EXOSTECTECTOMY TOE Right 02/12/2015   Procedure: EXOSTECTECTOMY TOE;  Surgeon: Linus Galasodd Cline, MD;  Location: [facility] ORS;  Service: Podiatry;  Laterality: Right;  . KNEE ARTHROSCOPY  03/13/2012   Procedure: ARTHROSCOPY KNEE;  Surgeon: Loanne DrillingFrank V Aluisio, MD;  Location: [facility] ORS;  Service:  Orthopedics;  Laterality: Right;  debridementt right medial meniscus/chrondroplasty  . PARTIAL HYSTERECTOMY  1999   pelvic pain    Prior to Admission medications   Medication Sig Start Date End Date Taking? Authorizing Provider  albuterol (VENTOLIN HFA) 108 (90 Base) MCG/ACT inhaler Inhale 2 puffs into the lungs every 4 (four) hours as needed for wheezing or shortness of breath. 12/20/18   Lorre MunroeBaity, Regina W, NP  amLODipine (NORVASC) 5 MG tablet Take 1 tablet (5 mg total) by mouth daily. 12/20/18   Lorre MunroeBaity, Regina W, NP  chlorpheniramine-HYDROcodone (TUSSIONEX PENNKINETIC ER) 10-8 MG/5ML SUER Take 5 mLs by mouth 2 (two) times daily. 01/18/19   Irean HongSung, Yarel Kilcrease J, MD  conjugated estrogens (PREMARIN) vaginal cream Place 1 Applicatorful vaginally 2 (two) times a week. 12/23/18   Lorre MunroeBaity, Regina W, NP  fluticasone (FLONASE) 50 MCG/ACT nasal spray Place 2 sprays into both nostrils daily. 12/20/18   Lorre MunroeBaity, Regina W, NP  Fluticasone-Salmeterol (ADVAIR) 100-50 MCG/DOSE AEPB Inhale 1 puff into the lungs 2 (two) times daily. 12/20/18   Lorre MunroeBaity, Regina W, NP  methylPREDNISolone (MEDROL DOSEPAK) 4 MG TBPK tablet Take as directed 01/18/19   Irean HongSung, Hadleigh Felber J, MD  Omega-3 Fatty Acids (FISH OIL) 1000 MG CAPS Take 1 capsule by mouth daily.    [provider]  Vitamin D, Ergocalciferol, (DRISDOL) 1.25 MG (50000 UT) CAPS capsule Take 1 capsule (50,000 Units total) by mouth every 7 (seven) days. 12/22/18   Lorre MunroeBaity, Regina W, NP    Allergies Lisinopril  Family History  Problem Relation Age of  Onset  . Diverticulitis Mother   . Deep vein thrombosis Mother   . Arthritis Maternal Grandmother   . Osteoporosis Maternal Grandmother   . Cancer Maternal Aunt        brain  . Breast cancer Neg Hx     Social History Social History   Tobacco Use  . Smoking status: Current Every Day Smoker    Packs/day: 1.00    Years: 28.00    Pack years: 28.00  . Smokeless tobacco: Never Used  . Tobacco comment: Decline Cessation  information/SLS  Substance Use Topics  . Alcohol use: Yes    Alcohol/week: 2.0 standard drinks    Types: 2 Cans of beer per week    Comment: 1-2 drinks beer every day  . Drug use: No    Review of Systems  Constitutional: No fever/chills Eyes: No visual changes. ENT: No sore throat. Cardiovascular: Positive for lower chest pain. Respiratory: Denies shortness of breath. Gastrointestinal: Positive for upper abdominal pain.  Positive for nausea, no vomiting.  No diarrhea.  No constipation. Genitourinary: Negative for dysuria. Musculoskeletal: Negative for back pain. Skin: Negative for rash. Neurological: Negative for headaches, focal weakness or numbness.   ____________________________________________   PHYSICAL EXAM:  VITAL SIGNS: ED Triage Vitals  Enc Vitals Group     BP 01/17/19 1855 (!) 162/89     Pulse Rate 01/17/19 1855 71     Resp 01/17/19 1855 20     Temp 01/17/19 1855 98.6 F (37 C)     Temp Source 01/17/19 1855 Oral     SpO2 01/17/19 1855 96 %     Weight --      Height --      Head Circumference --      Peak Flow --      Pain Score 01/17/19 1851 7     Pain Loc --      Pain Edu? --      Excl. in GC? --     Constitutional: Alert and oriented. Well appearing and in no acute distress. Eyes: Conjunctivae are normal. PERRL. EOMI. Head: Atraumatic. Nose: No congestion/rhinnorhea. Mouth/Throat: Mucous membranes are moist.  Oropharynx non-erythematous. Neck: No stridor.   Cardiovascular: Normal rate, regular rhythm. Grossly normal heart sounds.  Good peripheral circulation. Respiratory: Normal respiratory effort.  No retractions. Lungs CTAB. Gastrointestinal: Soft and nontender to light or deep palpation. No distention. No abdominal bruits. No CVA tenderness. Musculoskeletal: No lower extremity tenderness nor edema.  No joint effusions. Neurologic:  Normal speech and language. No gross focal neurologic deficits are appreciated. No gait instability. Skin:  Skin  is warm, dry and intact. No rash noted. Psychiatric: Mood and affect are normal. Speech and behavior are normal.  ____________________________________________   LABS (all labs ordered are listed, but only abnormal results are displayed)  Labs Reviewed  COMPREHENSIVE METABOLIC PANEL - Abnormal; Notable for the following components:      Result Value   Glucose, Bld 100 (*)    Total Protein 8.2 (*)    All other components within normal limits  CBC - Abnormal; Notable for the following components:   Hemoglobin 15.1 (*)    HCT 46.1 (*)    All other components within normal limits  URINALYSIS, COMPLETE (UACMP) WITH MICROSCOPIC - Abnormal; Notable for the following components:   Color, Urine COLORLESS (*)    APPearance CLEAR (*)    All other components within normal limits  LIPASE, BLOOD  TROPONIN I (HIGH SENSITIVITY)   ____________________________________________  EKG  ED ECG REPORT I, Adiel Erney J, the attending physician, personally viewed and interpreted this ECG.   Date: 01/18/2019  EKG Time: 1900  Rate: 70  Rhythm: normal EKG, normal sinus rhythm  Axis: RAD  Intervals:none  ST&T Change: Nonspecific  ____________________________________________  RADIOLOGY  ED MD interpretation: No PE, fatty liver without gallstones  Official radiology report(s): Ct Angio Chest Pe W/cm &/or Wo Cm  Result Date: 01/18/2019 CLINICAL DATA:  Chest and back pain. EXAM: CT ANGIOGRAPHY CHEST WITH CONTRAST TECHNIQUE: Multidetector CT imaging of the chest was performed using the standard protocol during bolus administration of intravenous contrast. Multiplanar CT image reconstructions and MIPs were obtained to evaluate the vascular anatomy. CONTRAST:  75mL OMNIPAQUE IOHEXOL 350 MG/ML SOLN COMPARISON:  Chest radiograph 07/05/2016, no recent exams. FINDINGS: Cardiovascular: There are no filling defects within the pulmonary arteries to suggest pulmonary embolus. Mild atherosclerosis of the thoracic  aorta. No aortic dissection. Heart is normal in size. No pericardial effusion. Mediastinum/Nodes: Small mediastinal nodes not enlarged by size criteria. No hilar adenopathy. Minimal distal esophageal wall thickening. No visualized thyroid nodule. Lungs/Pleura: Minimal apical emphysema. Minimal right upper lobe scarring. No focal airspace disease. Mild central bronchial thickening. No pulmonary edema or pleural fluid. No pulmonary mass. Upper Abdomen: No acute abnormality. Musculoskeletal: There are no acute or suspicious osseous abnormalities. Mild degenerative change in the thoracic spine with scattered Schmorl's nodes. Review of the MIP images confirms the above findings. IMPRESSION: 1. No pulmonary embolus. 2. Mild aortic atherosclerosis, no dissection. 3. Mild central bronchial thickening.  Minimal apical emphysema. Aortic Atherosclerosis (ICD10-I70.0) and Emphysema (ICD10-J43.9). Electronically Signed   By: Narda RutherfordMelanie  Sanford M.D.   On: 01/18/2019 00:36   [place] Abdomen Limited Ruq  Result Date: 01/18/2019 CLINICAL DATA:  [age] year old female with upper abdominal pain. EXAM: ULTRASOUND ABDOMEN LIMITED RIGHT UPPER QUADRANT COMPARISON:  None. FINDINGS: Gallbladder: No gallstones or wall thickening visualized. No sonographic Murphy sign noted by sonographer. Common bile duct: Diameter: 5 mm Liver: There is diffuse increased liver echogenicity most consistent with fatty infiltration. Superimposed fibrosis or inflammation is not excluded. Portal vein is patent on color Doppler imaging with normal direction of blood flow towards the liver. IMPRESSION: 1. No gallstone. 2. Fatty liver. Electronically Signed   By: Elgie CollardArash  Radparvar M.D.   On: 01/18/2019 01:57    ____________________________________________   PROCEDURES  Procedure(s) performed (including Critical Care):  Procedures   ____________________________________________   INITIAL IMPRESSION / ASSESSMENT AND PLAN / ED COURSE  As part of my medical  decision making, I reviewed the following data within the electronic MEDICAL RECORD NUMBER Nursing notes reviewed and incorporated, Labs reviewed, EKG interpreted, Old chart reviewed, Radiograph reviewed and Notes from prior ED visits     Fuller Songngela R Parcel was evaluated in Emergency Department on 01/18/2019 for the symptoms described in the history of present illness. She was evaluated in the context of the global COVID-19 pandemic, which necessitated consideration that the patient might be at risk for infection with the SARS-CoV-2 virus that causes COVID-19. Institutional protocols and algorithms that pertain to the evaluation of patients at risk for COVID-19 are in a state of rapid change based on information released by regulatory bodies including the CDC and federal and state organizations. These policies and algorithms were followed during the patient's care in the ED.   [age] year old female with COPD who presents with a 6-day history of upper abdominal/lower chest pain associated with nausea. Differential diagnosis includes, but is not limited to, biliary disease (biliary colic, acute  cholecystitis, cholangitis, choledocholithiasis, etc), intrathoracic causes for epigastric abdominal pain including ACS, gastritis, duodenitis, pancreatitis, small bowel or large bowel obstruction, abdominal aortic aneurysm, hernia, and ulcer(s).  Laboratory results and EKG largely unremarkable.  High-sensitivity troponin is only 4; do not feel repeat is necessary given patient has had 6 days of this pain.  Pain does have a pleuritic quality to it; will obtain CTA to evaluate for pulmonary embolism.  Clinical Course as of Jan 17 341  Sat Jan 18, 2019  0215 Updated patient on all test results.  Will administer GI cocktail and Tussionex for cough.  Discharge home on Medrol Dosepak and Tussionex.  Strict return precautions given.  Patient verbalizes understanding and agrees with plan of care.   [JS]    Clinical Course User  Index [JS] Paulette Blanch, MD     ____________________________________________   FINAL CLINICAL IMPRESSION(S) / ED DIAGNOSES  Final diagnoses:  Cough  Pleurisy     ED Discharge Orders         Ordered    methylPREDNISolone (MEDROL DOSEPAK) 4 MG TBPK tablet     01/18/19 0217    chlorpheniramine-HYDROcodone (TUSSIONEX PENNKINETIC ER) 10-8 MG/5ML SUER  2 times daily     01/18/19 0217           Note:  This document was prepared using Dragon voice recognition software and may include unintentional dictation errors.   Paulette Blanch, MD 01/18/19 [phone]

## 2019-01-17 NOTE — ED Triage Notes (Signed)
Pt ambulatory to triage.  Pt has abd pain and nausea for 6 days.  Pt denies chest pain, sob.  No fever  cig smoker.  Pt alert  Speech clear

## 2019-01-18 ENCOUNTER — Emergency Department: Payer: BC Managed Care – PPO

## 2019-01-18 DIAGNOSIS — K760 Fatty (change of) liver, not elsewhere classified: Secondary | ICD-10-CM | POA: Diagnosis not present

## 2019-01-18 DIAGNOSIS — R079 Chest pain, unspecified: Secondary | ICD-10-CM | POA: Diagnosis not present

## 2019-01-18 MED ORDER — HYDROCOD POLST-CPM POLST ER 10-8 MG/5ML PO SUER: 5.0000 mL | Freq: Two times a day (BID) | ORAL | 0 refills | Status: DC

## 2019-01-18 MED ORDER — ALUM & MAG HYDROXIDE-SIMETH 200-200-20 MG/5ML PO SUSP
30.0000 mL | Freq: Once | ORAL | Status: AC
  Administered 2019-01-18: 30 mL via ORAL
  Filled 2019-01-18: qty 30

## 2019-01-18 MED ORDER — LIDOCAINE VISCOUS HCL 2 % MT SOLN
15.0000 mL | Freq: Once | OROMUCOSAL | Status: AC
  Administered 2019-01-18: 15 mL via ORAL
  Filled 2019-01-18: qty 15

## 2019-01-18 MED ORDER — HYDROCOD POLST-CPM POLST ER 10-8 MG/5ML PO SUER
5.0000 mL | Freq: Once | ORAL | Status: AC
  Administered 2019-01-18: 5 mL via ORAL
  Filled 2019-01-18: qty 5

## 2019-01-18 MED ORDER — METHYLPREDNISOLONE 4 MG PO TBPK: ORAL_TABLET | ORAL | 0 refills | Status: DC

## 2019-01-18 NOTE — ED Notes (Signed)
Pt to CT at this time.

## 2019-01-18 NOTE — Discharge Instructions (Signed)
1.  Take steroid taper as prescribed (Medrol Dosepak). 2.  You may take Tussionex as needed for cough. 3.  Return to the ER for worsening symptoms, persistent vomiting, difficulty breathing or other concerns.

## 2019-01-18 NOTE — ED Notes (Signed)
Signature pad not working in room at this time.

## 2019-01-18 NOTE — Telephone Encounter (Signed)
noted 

## 2019-01-24 ENCOUNTER — Ambulatory Visit: Payer: BC Managed Care – PPO | Admitting: Internal Medicine

## 2019-01-24 DIAGNOSIS — Z0289 Encounter for other administrative examinations: Secondary | ICD-10-CM

## 2019-01-24 NOTE — Progress Notes (Deleted)
Subjective:    Patient ID: Allison Brennan, female    DOB: May 19, 1966, 53 y.o.   MRN: [id number]  HPI  Patient presents to the clinic today for right knee pain.  She was recently seen at [facility], was given a Rx for Celebrex and was given leg exercises and was advised to reach out to her PCP.  She originally injured her right knee after a fall at work on 12/30/2018 where she landed directly on her knee.  She went to the ED 2 days later due to continued pain.  Xrays revealed mild degenerative changes and a moderate sized joint effusion.  She has a history of a right MCL repair in 2014.  DG of right knee from 01/01/2019 reviewed.  Review of Systems  Past Medical History:  Diagnosis Date  . Back pain, lumbosacral   . COPD (chronic obstructive pulmonary disease) ([place])    no pulmonologist  . ZJQBHALP(379.0)   . Hypertension   . PPD positive, treated 1998    Current Outpatient Medications  Medication Sig Dispense Refill  . albuterol (VENTOLIN HFA) 108 (90 Base) MCG/ACT inhaler Inhale 2 puffs into the lungs every 4 (four) hours as needed for wheezing or shortness of breath. 18 g 0  . amLODipine (NORVASC) 5 MG tablet Take 1 tablet (5 mg total) by mouth daily. 30 tablet 0  . chlorpheniramine-HYDROcodone (TUSSIONEX PENNKINETIC ER) 10-8 MG/5ML SUER Take 5 mLs by mouth 2 (two) times daily. 70 mL 0  . conjugated estrogens (PREMARIN) vaginal cream Place 1 Applicatorful vaginally 2 (two) times a week. 42.5 g 12  . fluticasone (FLONASE) 50 MCG/ACT nasal spray Place 2 sprays into both nostrils daily. 16 g 2  . Fluticasone-Salmeterol (ADVAIR) 100-50 MCG/DOSE AEPB Inhale 1 puff into the lungs 2 (two) times daily. 1 each 3  . methylPREDNISolone (MEDROL DOSEPAK) 4 MG TBPK tablet Take as directed 21 tablet 0  . Omega-3 Fatty Acids (FISH OIL) 1000 MG CAPS Take 1 capsule by mouth daily.    . Vitamin D, Ergocalciferol, (DRISDOL) 1.25 MG (50000 UT) CAPS capsule Take 1 capsule (50,000 Units total) by mouth  every 7 (seven) days. 12 capsule 0   No current facility-administered medications for this visit.     Allergies  Allergen Reactions  . Lisinopril Cough    Family History  Problem Relation Age of Onset  . Diverticulitis Mother   . Deep vein thrombosis Mother   . Arthritis Maternal Grandmother   . Osteoporosis Maternal Grandmother   . Cancer Maternal Aunt        brain  . Breast cancer Neg Hx     Social History   Socioeconomic History  . Marital status: Widowed    Spouse name: Not on file  . Number of children: 2  . Years of education: Not on file  . Highest education level: Not on file  Occupational History  . 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 Financial resource strain: Not on file  . Food insecurity    Worry: Not on file    Inability: Not on file  . Transportation needs    Medical: Not on file    Non-medical: Not on file  Tobacco Use  . Smoking status: Current Every Day Smoker    Packs/day: 1.00    Years: 28.00    Pack years: 28.00  . Smokeless tobacco: Never Used  . Tobacco comment: Decline Cessation information/SLS  Substance and Sexual Activity  .  Alcohol use: Yes    Alcohol/week: 2.0 standard drinks    Types: 2 Cans of beer per week    Comment: 1-2 drinks beer every day  . Drug use: No  . Sexual activity: Not Currently  Lifestyle  . Physical activity    Days per week: Not on file    Minutes per session: Not on file  . Stress: Not on file  Relationships  . Social Musicianconnections    Talks on phone: Not on file    Gets together: Not on file    Attends religious service: Not on file    Active member of club or organization: Not on file    Attends meetings of clubs or organizations: Not on file    Relationship status: Not on file  . Intimate partner violence    Fear of current or ex partner: Not on file    Emotionally abused: Not on file    Physically abused: Not on file    Forced sexual activity: Not on file  Other Topics  Concern  . Not on file  Social History Narrative   [place] in [place], no pets. Both children living with her.       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       Regular Exercise -  NO   Daily Caffeine Use:  6 - 8 sodas   Diet-     Constitutional: Denies fever, malaise, fatigue, headache or abrupt weight changes.  HEENT: Denies eye pain, eye redness, ear pain, ringing in the ears, wax buildup, runny nose, nasal congestion, bloody nose, or sore throat. Respiratory: Denies difficulty breathing, shortness of breath, cough or sputum production.   Cardiovascular: Denies chest pain, chest tightness, palpitations or swelling in the hands or feet.  Gastrointestinal: Denies abdominal pain, bloating, constipation, diarrhea or blood in the stool.  GU: Denies urgency, frequency, pain with urination, burning sensation, blood in urine, odor or discharge. Musculoskeletal: Denies decrease in range of motion, difficulty with gait, muscle pain or joint pain and swelling.  Skin: Denies redness, rashes, lesions or ulcercations.  Neurological: Denies dizziness, difficulty with memory, difficulty with speech or problems with balance and coordination.  Psych: Denies anxiety, depression, SI/HI.  No other specific complaints in a complete review of systems (except as listed in HPI above).     Objective:   Physical Exam  There were no vitals taken for this visit. Wt Readings from Last 3 Encounters:  01/01/19 184 lb (83.5 kg)  12/20/18 184 lb 8 oz (83.7 kg)  12/24/17 183 lb (83 kg)    General: Appears their stated age, well developed, well nourished in NAD. Skin: Warm, dry and intact. No rashes, lesions or ulcerations noted. HEENT: Head: normal shape and size; Eyes: sclera white, no icterus, conjunctiva pink, PERRLA and EOMs intact; Ears: Tm's gray and intact, normal light reflex; Nose: mucosa pink and moist, septum midline; Throat/Mouth: Teeth present, mucosa pink and moist, no exudate, lesions or  ulcerations noted.  Neck:  Neck supple, trachea midline. No masses, lumps or thyromegaly present.  Cardiovascular: Normal rate and rhythm. S1,S2 noted.  No murmur, rubs or gallops noted. No JVD or BLE edema. No carotid bruits noted. Pulmonary/Chest: Normal effort and positive vesicular breath sounds. No respiratory distress. No wheezes, rales or ronchi noted.  Abdomen: Soft and nontender. Normal bowel sounds. No distention or masses noted. Liver, spleen and kidneys non palpable. Musculoskeletal: Normal range of motion. No signs of joint swelling. No difficulty with gait.  Neurological: Alert and oriented. Cranial nerves II-XII grossly intact. Coordination normal.  Psychiatric: Mood and affect normal. Behavior is normal. Judgment and thought content normal.   EKG:  BMET    Component Value Date/Time   NA 139 01/17/2019 1855   K 4.1 01/17/2019 1855   CL 103 01/17/2019 1855   CO2 27 01/17/2019 1855   GLUCOSE 100 (H) 01/17/2019 1855   BUN 8 01/17/2019 1855   CREATININE 0.54 01/17/2019 1855   CREATININE 0.77 12/20/2018 1501   CALCIUM 9.6 01/17/2019 1855   GFRNONAA >60 01/17/2019 1855   GFRNONAA 88 12/20/2018 1501   GFRAA >60 01/17/2019 1855   GFRAA 101 12/20/2018 1501    Lipid Panel     Component Value Date/Time   CHOL 215 (H) 12/20/2018 1501   TRIG 567 (H) 12/20/2018 1501   HDL 30 (L) 12/20/2018 1501   CHOLHDL 7.2 (H) 12/20/2018 1501   VLDL 77.4 (H) 04/10/2016 0812   LDLCALC  12/20/2018 1501     Comment:     . LDL cholesterol not calculated. Triglyceride levels greater than 400 mg/dL invalidate calculated LDL results. . Reference range: <100 . Desirable range <100 mg/dL for primary prevention;   <70 mg/dL for patients with CHD or diabetic patients  with > or = 2 CHD risk factors. Marland Kitchen. LDL-C is now calculated using the Martin-Hopkins  calculation, which is a validated novel method providing  better accuracy than the Friedewald equation in the  estimation of LDL-C.  Horald PollenMartin  SS et al. Lenox AhrJAMA. 1610;960(452013;310(19): 2061-2068  (http://education.[facility].com/faq/FAQ164)     CBC    Component Value Date/Time   WBC 10.5 01/17/2019 1855   RBC 4.90 01/17/2019 1855   HGB 15.1 (H) 01/17/2019 1855   HCT 46.1 (H) 01/17/2019 1855   PLT 244 01/17/2019 1855   MCV 94.1 01/17/2019 1855   MCH 30.8 01/17/2019 1855   MCHC 32.8 01/17/2019 1855   RDW 13.0 01/17/2019 1855   LYMPHSABS 2.1 04/10/2016 0812   MONOABS 0.6 04/10/2016 0812   EOSABS 0.3 04/10/2016 0812   BASOSABS 0.1 04/10/2016 0812    Hgb A1C Lab Results  Component Value Date   HGBA1C 5.4 12/20/2018            Assessment & Plan:

## 2019-03-09 ENCOUNTER — Encounter: Payer: Self-pay | Admitting: Internal Medicine

## 2019-04-29 ENCOUNTER — Other Ambulatory Visit: Payer: Self-pay | Admitting: Internal Medicine

## 2019-08-22 ENCOUNTER — Other Ambulatory Visit: Payer: Self-pay | Admitting: Internal Medicine

## 2019-09-17 ENCOUNTER — Encounter: Payer: Self-pay | Admitting: Internal Medicine

## 2019-09-17 ENCOUNTER — Ambulatory Visit (INDEPENDENT_AMBULATORY_CARE_PROVIDER_SITE_OTHER): Payer: BC Managed Care – PPO | Admitting: Internal Medicine

## 2019-09-17 DIAGNOSIS — J441 Chronic obstructive pulmonary disease with (acute) exacerbation: Secondary | ICD-10-CM | POA: Diagnosis not present

## 2019-09-17 MED ORDER — PREDNISONE 10 MG PO TABS: ORAL_TABLET | ORAL | 0 refills | Status: DC

## 2019-09-17 MED ORDER — PROMETHAZINE-DM 6.25-15 MG/5ML PO SYRP: 5.0000 mL | ORAL_SOLUTION | Freq: Four times a day (QID) | ORAL | 0 refills | Status: DC | PRN

## 2019-09-17 NOTE — Progress Notes (Signed)
Virtual Visit via Video Note  I connected with Allison Brennan on 09/17/19 at  3:00 PM EDT by a video enabled telemedicine application and verified that I am speaking with the correct person using two identifiers.  Location: Patient: Home Provider: Office   I discussed the limitations of evaluation and management by telemedicine and the availability of in person appointments. The patient expressed understanding and agreed to proceed.  History of Present Illness:  Pt reports headache, nasal congestion, cough and wheezing. This started 3 days ago. The cough is productive of white mucous. She reports associated scratchy throat and wheezing. The headache is located in her forehead. She describes the pain as pressure. She denies dizziness or visual changes. She is not blowing anything out of her nose.  She denies runny nose, ear pain, sore throat, loss of taste/smell or SOB. She denies fever or body aches but has had some chills. She has had some nausea but denies vomiting or diarrhea. She has not had sick contacts or exposure to Covid that she is aware of.  She has a history of COPD, managed on daily Advair and Albuterol as needed. She does smoke. She has tried [facility] powders with minimal relief.   Past Medical History:  Diagnosis Date  . Back pain, lumbosacral   . COPD (chronic obstructive pulmonary disease) (HCC)    no pulmonologist  . OYDXAJOI(786.7)   . Hypertension   . PPD positive, treated 1998    Current Outpatient Medications  Medication Sig Dispense Refill  . albuterol (VENTOLIN HFA) 108 (90 Base) MCG/ACT inhaler INHALE 2 PUFFS INTO LUNGS EVERY 4 HOURS AS NEEDED FOR WHEEZING OR  SHORTNESS  OF  BREATH 18 g 3  . amLODipine (NORVASC) 5 MG tablet Take 1 tablet (5 mg total) by mouth daily. MUST SCHEDULE PHYSICAL EXAM 30 tablet 0  . chlorpheniramine-HYDROcodone (TUSSIONEX PENNKINETIC ER) 10-8 MG/5ML SUER Take 5 mLs by mouth 2 (two) times daily. 70 mL 0  . conjugated estrogens (PREMARIN)  vaginal cream Place 1 Applicatorful vaginally 2 (two) times a week. 42.5 g 12  . fluticasone (FLONASE) 50 MCG/ACT nasal spray Place 2 sprays into both nostrils daily. 16 g 2  . Fluticasone-Salmeterol (ADVAIR) 100-50 MCG/DOSE AEPB Inhale 1 puff into the lungs 2 (two) times daily. 1 each 3  . methylPREDNISolone (MEDROL DOSEPAK) 4 MG TBPK tablet Take as directed 21 tablet 0  . Omega-3 Fatty Acids (FISH OIL) 1000 MG CAPS Take 1 capsule by mouth daily.    . Vitamin D, Ergocalciferol, (DRISDOL) 1.25 MG (50000 UT) CAPS capsule Take 1 capsule (50,000 Units total) by mouth every 7 (seven) days. 12 capsule 0   No current facility-administered medications for this visit.    Allergies  Allergen Reactions  . Lisinopril Cough    Family History  Problem Relation Age of Onset  . Diverticulitis Mother   . Deep vein thrombosis Mother   . Arthritis Maternal Grandmother   . Osteoporosis Maternal Grandmother   . Cancer Maternal Aunt        brain  . Breast cancer Neg Hx     Social History   Socioeconomic History  . Marital status: Widowed    Spouse name: Not on file  . Number of children: 2  . Years of education: Not on file  . Highest education level: Not on file  Occupational History  . 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 Smoking status: Current Every Day Smoker  Packs/day: 1.00    Years: 28.00    Pack years: 28.00  . Smokeless tobacco: Never Used  . Tobacco comment: Decline Cessation information/SLS  Substance and Sexual Activity  . Alcohol use: Yes    Alcohol/week: 2.0 standard drinks    Types: 2 Cans of beer per week    Comment: 1-2 drinks beer every day  . Drug use: No  . Sexual activity: Not Currently  Other Topics Concern  . Not on file  Social History Narrative   Lives alone in [place], no pets. Both children living with her.       Works in Audiological scientist.       Regular Exercise -  NO   Daily Caffeine Use:  6 - 8 sodas   Diet-    Social Determinants of Health   Financial Resource Strain:   . Difficulty of Paying Living Expenses:   Food Insecurity:   . Worried About Charity fundraiser in the Last Year:   . Arboriculturist in the Last Year:   Transportation Needs:   . Film/video editor (Medical):   Marland Kitchen Lack of Transportation (Non-Medical):   Physical Activity:   . Days of Exercise per Week:   . Minutes of Exercise per Session:   Stress:   . Feeling of Stress :   Social Connections:   . Frequency of Communication with Friends and Family:   . Frequency of Social Gatherings with Friends and Family:   . Attends Religious Services:   . Active Member of Clubs or Organizations:   . Attends Archivist Meetings:   Marland Kitchen Marital Status:   Intimate Partner Violence:   . Fear of Current or Ex-Partner:   . Emotionally Abused:   Marland Kitchen Physically Abused:   . Sexually Abused:      Constitutional: Pt reports headache, chills. Denies fever, malaise, fatigue, or abrupt weight changes.  HEENT: Pt reports nasal congestion. Denies eye pain, eye redness, ear pain, ringing in the ears, wax buildup, runny nose, bloody nose, or sore throat. Respiratory: Pt reports cough and wheezing. Denies difficulty breathing, shortness of breath .   Cardiovascular: Denies chest pain, chest tightness, palpitations or swelling in the hands or feet.   No other specific complaints in a complete review of systems (except as listed in HPI above).    Observations/Objective:   Wt Readings from Last 3 Encounters:  01/01/19 184 lb (83.5 kg)  12/20/18 184 lb 8 oz (83.7 kg)  12/24/17 183 lb (83 kg)    General: Appears her stated age, obese, in NAD. HEENT: Head: normal shape and size;  Nose: nasal congestion noted ; Throat/Mouth: no hoarseness noted Pulmonary/Chest: Normal effort. No respiratory distress. Dry cough noted. Neurological: Alert and oriented.   BMET    Component Value Date/Time   NA 139 01/17/2019 1855   K 4.1  01/17/2019 1855   CL 103 01/17/2019 1855   CO2 27 01/17/2019 1855   GLUCOSE 100 (H) 01/17/2019 1855   BUN 8 01/17/2019 1855   CREATININE 0.54 01/17/2019 1855   CREATININE 0.77 12/20/2018 1501   CALCIUM 9.6 01/17/2019 1855   GFRNONAA >60 01/17/2019 1855   GFRNONAA 88 12/20/2018 1501   GFRAA >60 01/17/2019 1855   GFRAA 101 12/20/2018 1501    Lipid Panel     Component Value Date/Time   CHOL 215 (H) 12/20/2018 1501   TRIG 567 (H) 12/20/2018 1501   HDL 30 (L) 12/20/2018 1501   CHOLHDL  7.2 (H) 12/20/2018 1501   VLDL 77.4 (H) 04/10/2016 0812   LDLCALC  12/20/2018 1501     Comment:     . LDL cholesterol not calculated. Triglyceride levels greater than 400 mg/dL invalidate calculated LDL results. . Reference range: <100 . Desirable range <100 mg/dL for primary prevention;   <70 mg/dL for patients with CHD or diabetic patients  with > or = 2 CHD risk factors. Marland Kitchen LDL-C is now calculated using the Martin-Hopkins  calculation, which is a validated novel method providing  better accuracy than the Friedewald equation in the  estimation of LDL-C.  Horald Pollen et al. Lenox Ahr. 3875;643(32): 2061-2068  (http://education.[facility].com/faq/FAQ164)     CBC    Component Value Date/Time   WBC 10.5 01/17/2019 1855   RBC 4.90 01/17/2019 1855   HGB 15.1 (H) 01/17/2019 1855   HCT 46.1 (H) 01/17/2019 1855   PLT 244 01/17/2019 1855   MCV 94.1 01/17/2019 1855   MCH 30.8 01/17/2019 1855   MCHC 32.8 01/17/2019 1855   RDW 13.0 01/17/2019 1855   LYMPHSABS 2.1 04/10/2016 0812   MONOABS 0.6 04/10/2016 0812   EOSABS 0.3 04/10/2016 0812   BASOSABS 0.1 04/10/2016 0812    Hgb A1C Lab Results  Component Value Date   HGBA1C 5.4 12/20/2018       Assessment and Plan:  COPD Exacerbation:  Continue Advair and Albuterol Start antihistamine like Claritin OTC, along with Flonase RX for Pred Taper x 6 days RX for Promethazine DM cough syrup No indication for abx at this time  Return  precautions discussed  Follow Up Instructions:    I discussed the assessment and treatment plan with the patient. The patient was provided an opportunity to ask questions and all were answered. The patient agreed with the plan and demonstrated an understanding of the instructions.   The patient was advised to call back or seek an in-person evaluation if the symptoms worsen or if the condition fails to improve as anticipated.     Nicki Reaper, NP

## 2019-09-17 NOTE — Patient Instructions (Signed)

## 2019-11-27 ENCOUNTER — Encounter: Payer: Self-pay | Admitting: Emergency Medicine

## 2019-11-27 ENCOUNTER — Emergency Department
Admission: EM | Admit: 2019-11-27 | Discharge: 2019-11-28 | Disposition: A | Discharge status: Home / Self Care | Payer: BC Managed Care – PPO | Attending: Emergency Medicine | Admitting: Emergency Medicine

## 2019-11-27 ENCOUNTER — Emergency Department: Payer: BC Managed Care – PPO

## 2019-11-27 ENCOUNTER — Other Ambulatory Visit: Payer: Self-pay

## 2019-11-27 DIAGNOSIS — J449 Chronic obstructive pulmonary disease, unspecified: Secondary | ICD-10-CM | POA: Insufficient documentation

## 2019-11-27 DIAGNOSIS — F17210 Nicotine dependence, cigarettes, uncomplicated: Secondary | ICD-10-CM | POA: Insufficient documentation

## 2019-11-27 DIAGNOSIS — K2900 Acute gastritis without bleeding: Secondary | ICD-10-CM

## 2019-11-27 DIAGNOSIS — K279 Peptic ulcer, site unspecified, unspecified as acute or chronic, without hemorrhage or perforation: Secondary | ICD-10-CM | POA: Insufficient documentation

## 2019-11-27 DIAGNOSIS — R1013 Epigastric pain: Secondary | ICD-10-CM

## 2019-11-27 DIAGNOSIS — Z79899 Other long term (current) drug therapy: Secondary | ICD-10-CM | POA: Diagnosis not present

## 2019-11-27 DIAGNOSIS — R519 Headache, unspecified: Secondary | ICD-10-CM | POA: Diagnosis not present

## 2019-11-27 DIAGNOSIS — I10 Essential (primary) hypertension: Secondary | ICD-10-CM | POA: Diagnosis not present

## 2019-11-27 DIAGNOSIS — K760 Fatty (change of) liver, not elsewhere classified: Secondary | ICD-10-CM | POA: Diagnosis not present

## 2019-11-27 LAB — CBC
HCT: 50.2 % — ABNORMAL HIGH (ref 36.0–46.0)
Hemoglobin: 16.9 g/dL — ABNORMAL HIGH (ref 12.0–15.0)
MCH: 30.8 pg (ref 26.0–34.0)
MCHC: 33.7 g/dL (ref 30.0–36.0)
MCV: 91.4 fL (ref 80.0–100.0)
Platelets: 272 10*3/uL (ref 150–400)
RBC: 5.49 MIL/uL — ABNORMAL HIGH (ref 3.87–5.11)
RDW: 12.7 % (ref 11.5–15.5)
WBC: 10.3 10*3/uL (ref 4.0–10.5)
nRBC: 0 % (ref 0.0–0.2)

## 2019-11-27 LAB — URINALYSIS, COMPLETE (UACMP) WITH MICROSCOPIC
Bacteria, UA: NONE SEEN
Bilirubin Urine: NEGATIVE
Glucose, UA: NEGATIVE mg/dL
Hgb urine dipstick: NEGATIVE
Ketones, ur: NEGATIVE mg/dL
Leukocytes,Ua: NEGATIVE
Nitrite: NEGATIVE
Protein, ur: NEGATIVE mg/dL
Specific Gravity, Urine: 1.019 (ref 1.005–1.030)
pH: 5 (ref 5.0–8.0)

## 2019-11-27 LAB — COMPREHENSIVE METABOLIC PANEL
ALT: 27 U/L (ref 0–44)
AST: 29 U/L (ref 15–41)
Albumin: 4.4 g/dL (ref 3.5–5.0)
Alkaline Phosphatase: 86 U/L (ref 38–126)
Anion gap: 12 (ref 5–15)
BUN: 17 mg/dL (ref 6–20)
CO2: 26 mmol/L (ref 22–32)
Calcium: 10.2 mg/dL (ref 8.9–10.3)
Chloride: 100 mmol/L (ref 98–111)
Creatinine, Ser: 0.83 mg/dL (ref 0.44–1.00)
GFR calc Af Amer: >60 mL/min (ref >60)
GFR calc non Af Amer: >60 mL/min (ref >60)
Glucose, Bld: 111 mg/dL — ABNORMAL HIGH (ref 70–99)
Potassium: 4.1 mmol/L (ref 3.5–5.1)
Sodium: 138 mmol/L (ref 135–145)
Total Bilirubin: 0.9 mg/dL (ref 0.3–1.2)
Total Protein: 8.3 g/dL — ABNORMAL HIGH (ref 6.5–8.1)

## 2019-11-27 LAB — TROPONIN I (HIGH SENSITIVITY): Troponin I (High Sensitivity): 3 ng/L (ref <18)

## 2019-11-27 LAB — LIPASE, BLOOD: Lipase: 29 U/L (ref 11–51)

## 2019-11-27 MED ORDER — ONDANSETRON 4 MG PO TBDP
4.0000 mg | ORAL_TABLET | Freq: Once | ORAL | Status: AC | PRN
  Administered 2019-11-27: 4 mg via ORAL
  Filled 2019-11-27: qty 1

## 2019-11-27 MED ORDER — LIDOCAINE VISCOUS HCL 2 % MT SOLN
15.0000 mL | Freq: Once | OROMUCOSAL | Status: AC
  Administered 2019-11-28: 15 mL via OROMUCOSAL

## 2019-11-27 NOTE — ED Triage Notes (Signed)
Patient to ER for c/o emesis since Sunday with epigastric pain. Patient reports history of similar episodes, unsure of what diagnosis was. Patient reports two vomiting episodes on Sunday, one episode Monday and Tuesday, yesterday and today +nausea without emesis.

## 2019-11-27 NOTE — ED Notes (Signed)
Full rainbow drawn at triage by this EDT

## 2019-11-27 NOTE — ED Provider Notes (Addendum)
[facility] Emergency Department Provider Note    ____________________________________________   I have reviewed the triage vital signs and the nursing notes.   HISTORY  Chief Complaint Abdominal Pain and Emesis   History limited by: Not Limited   HPI Allison Brennan is a 54 y.o. female who presents to the emergency department today because of concerns for abdominal pain and nausea and vomiting.  The symptoms started 4 days ago.  She states that the pain is located in the epigastric region.  She has not noticed that the pain is any better or worse when she tries eating.  This has been accompanied by multiple episodes of nausea and vomiting.  She states initially her vomit was yellow in appearance however is now more clear mucus.  Patient has noted is a slight decrease in her bowel movements although did have a bowel movement today.  Patient denies any fevers.  She denies any unusual ingestions.  She states that she has had a partial hysterectomy and a cesarean section but no other abdominal surgeries.   Records reviewed. Per medical record review patient has a history of HTN, COPD.   Past Medical History:  Diagnosis Date  . Back pain, lumbosacral   . COPD (chronic obstructive pulmonary disease) ([place])    no pulmonologist  . EUMPNTIR(443.1)   . Hypertension   . PPD positive, treated 1998    Patient Active Problem List   Diagnosis Date Noted  . HLD (hyperlipidemia) 09/10/2017  . Lumbago 11/13/2011  . Essential hypertension 08/29/2011  . COPD (chronic obstructive pulmonary disease) ([place]) 08/29/2011    Past Surgical History:  Procedure Laterality Date  . CESAREAN SECTION     x2  . CLOSED REDUCTION FINGER WITH PERCUTANEOUS PINNING Left 06/17/2015   Procedure: CLOSED REDUCTION FINGER WITH PERCUTANEOUS PINNING little finger;  Surgeon: Hessie Knows, MD;  Location: [facility] ORS;  Service: Orthopedics;  Laterality: Left;  . EXOSTECTECTOMY TOE Right 02/12/2015    Procedure: EXOSTECTECTOMY TOE;  Surgeon: Sharlotte Alamo, MD;  Location: [facility] ORS;  Service: Podiatry;  Laterality: Right;  . KNEE ARTHROSCOPY  03/13/2012   Procedure: ARTHROSCOPY KNEE;  Surgeon: Gearlean Alf, MD;  Location: [facility] ORS;  Service: Orthopedics;  Laterality: Right;  debridementt right medial meniscus/chrondroplasty  . PARTIAL HYSTERECTOMY  1999   pelvic pain    Prior to Admission medications   Medication Sig Start Date End Date Taking? Authorizing Provider  albuterol (VENTOLIN HFA) 108 (90 Base) MCG/ACT inhaler INHALE 2 PUFFS INTO LUNGS EVERY 4 HOURS AS NEEDED FOR WHEEZING OR  SHORTNESS  OF  BREATH 08/22/19   Jearld Fenton, NP  amLODipine (NORVASC) 5 MG tablet Take 1 tablet (5 mg total) by mouth daily. MUST SCHEDULE PHYSICAL EXAM 04/29/19   Jearld Fenton, NP  conjugated estrogens (PREMARIN) vaginal cream Place 1 Applicatorful vaginally 2 (two) times a week. 12/23/18   Jearld Fenton, NP  fluticasone (FLONASE) 50 MCG/ACT nasal spray Place 2 sprays into both nostrils daily. 12/20/18   Jearld Fenton, NP  Fluticasone-Salmeterol (ADVAIR) 100-50 MCG/DOSE AEPB Inhale 1 puff into the lungs 2 (two) times daily. 12/20/18   Jearld Fenton, NP  Omega-3 Fatty Acids (FISH OIL) 1000 MG CAPS Take 1 capsule by mouth daily.    [provider]  predniSONE (DELTASONE) 10 MG tablet Take 6 tabs day 1, 5 tabs day 2, 4 tabs day 3, 3 tabs day 4, 2 tabs day 5, 1 tab day 6 09/17/19   Garnette Gunner, [place]  W, NP  promethazine-dextromethorphan (PROMETHAZINE-DM) 6.25-15 MG/5ML syrup Take 5 mLs by mouth 4 (four) times daily as needed. 09/17/19   Lorre Munroe, NP    Allergies Lisinopril  Family History  Problem Relation Age of Onset  . Diverticulitis Mother   . Deep vein thrombosis Mother   . Arthritis Maternal Grandmother   . Osteoporosis Maternal Grandmother   . Cancer Maternal Aunt        brain  . Breast cancer Neg Hx     Social History Social History   Tobacco Use  . Smoking status: Current  Every Day Smoker    Packs/day: 1.00    Years: 28.00    Pack years: 28.00  . Smokeless tobacco: Never Used  . Tobacco comment: Decline Cessation information/SLS  Substance Use Topics  . Alcohol use: Yes    Alcohol/week: 2.0 standard drinks    Types: 2 Cans of beer per week    Comment: 1-2 drinks beer every day  . Drug use: No    Review of Systems Constitutional: No fever/chills Eyes: No visual changes. ENT: No sore throat. Cardiovascular: Denies chest pain. Respiratory: Denies shortness of breath. Gastrointestinal: Positive for abdominal pain, nausea and vomiting.   Genitourinary: Negative for dysuria. Musculoskeletal: Negative for back pain. Skin: Negative for rash. Neurological: Negative for headaches, focal weakness or numbness.  ____________________________________________   PHYSICAL EXAM:  VITAL SIGNS: ED Triage Vitals  Enc Vitals Group     BP 11/27/19 1427 (!) 145/91     Pulse Rate 11/27/19 1427 74     Resp 11/27/19 1427 18     Temp 11/27/19 1427 97.8 F (36.6 C)     Temp Source 11/27/19 1427 Oral     SpO2 11/27/19 1427 96 %     Weight 11/27/19 1428 188 lb (85.3 kg)     Height 11/27/19 1428 5\' 4"  (1.626 m)     Head Circumference --      Peak Flow --      Pain Score 11/27/19 1446 7   Constitutional: Alert and oriented.  Eyes: Conjunctivae are normal.  ENT      Head: Normocephalic and atraumatic.      Nose: No congestion/rhinnorhea.      Mouth/Throat: Mucous membranes are moist.      Neck: No stridor. Hematological/Lymphatic/Immunilogical: No cervical lymphadenopathy. Cardiovascular: Normal rate, regular rhythm.  No murmurs, rubs, or gallops.  Respiratory: Normal respiratory effort without tachypnea nor retractions. Breath sounds are clear and equal bilaterally. No wheezes/rales/rhonchi. Gastrointestinal: Soft and tender to palpation in the epigastric region.  Genitourinary: Deferred Musculoskeletal: Normal range of motion in all extremities. No lower  extremity edema. Neurologic:  Normal speech and language. No gross focal neurologic deficits are appreciated.  Skin:  Skin is warm, dry and intact. No rash noted. Psychiatric: Mood and affect are normal. Speech and behavior are normal. Patient exhibits appropriate insight and judgment.  ____________________________________________    LABS (pertinent positives/negatives)  UA hazy, 0-5 wbc CMP wnl except glu 111, t pro 8.3 CBC wbc 10.3, hgb 16.9, plt 272 Lipase 29 Trop hs 3 ____________________________________________   EKG  I, 11/29/19, attending physician, personally viewed and interpreted this EKG  EKG Time: 1431 Rate: 70 Rhythm: normal sinus rhythm Axis: normal Intervals: qtc 429 QRS: narrow ST changes: no st elevaiton Impression: normal ekg  ____________________________________________    RADIOLOGY  RUQ Phineas Semen  No acute abnormality  ____________________________________________   PROCEDURES  Procedures  ____________________________________________   INITIAL IMPRESSION / ASSESSMENT AND  PLAN / ED COURSE  Pertinent labs & imaging results that were available during my care of the patient were reviewed by me and considered in my medical decision making (see chart for details).   Patient presented to the emergency department today because of concerns for abdominal pain, nausea and vomiting this been present for the past 4 days.  The patient was tender in the epigastric region.  Patient's blood work without concerning leukocytosis, elevated lipase or LFTs.  Given epigastric pain will start imaging evaluation with right upper quadrant ultrasound.  RUQ [place] without concerning findings. Will get CT abd/pel. Will try viscous lidocaine.   ____________________________________________   FINAL CLINICAL IMPRESSION(S) / ED DIAGNOSES  Abdominal pain  Note: This dictation was prepared with Dragon dictation. Any transcriptional errors that result from this process are  unintentional     Phineas Semen, MD 11/27/19 2255    Phineas Semen, MD 11/27/19 [phone]

## 2019-11-27 NOTE — ED Notes (Signed)
Pt ambulatory to bathroom with a steady gait

## 2019-11-27 NOTE — ED Notes (Signed)
ED Provider goodman at bedside.

## 2019-11-28 ENCOUNTER — Emergency Department: Payer: BC Managed Care – PPO

## 2019-11-28 DIAGNOSIS — R1110 Vomiting, unspecified: Secondary | ICD-10-CM | POA: Diagnosis not present

## 2019-11-28 DIAGNOSIS — R112 Nausea with vomiting, unspecified: Secondary | ICD-10-CM | POA: Diagnosis not present

## 2019-11-28 MED ORDER — IOHEXOL 300 MG/ML  SOLN
100.0000 mL | Freq: Once | INTRAMUSCULAR | Status: AC | PRN
  Administered 2019-11-28: 100 mL via INTRAVENOUS

## 2019-11-28 MED ORDER — ALUM & MAG HYDROXIDE-SIMETH 200-200-20 MG/5ML PO SUSP
30.0000 mL | Freq: Once | ORAL | Status: AC
  Administered 2019-11-28: 30 mL via ORAL
  Filled 2019-11-28: qty 30

## 2019-11-28 MED ORDER — PANTOPRAZOLE SODIUM 40 MG PO TBEC
40.0000 mg | DELAYED_RELEASE_TABLET | Freq: Two times a day (BID) | ORAL | 0 refills | Status: DC
Start: 2019-11-28 — End: 2020-09-22

## 2019-11-28 MED ORDER — PANTOPRAZOLE SODIUM 40 MG PO TBEC
40.0000 mg | DELAYED_RELEASE_TABLET | Freq: Once | ORAL | Status: AC
  Administered 2019-11-28: 40 mg via ORAL
  Filled 2019-11-28: qty 1

## 2019-11-28 MED ORDER — LIDOCAINE VISCOUS HCL 2 % MT SOLN
15.0000 mL | Freq: Once | OROMUCOSAL | Status: DC
  Filled 2019-11-28: qty 15

## 2019-12-23 ENCOUNTER — Encounter: Payer: Self-pay | Admitting: Emergency Medicine

## 2019-12-23 ENCOUNTER — Emergency Department
Admission: EM | Admit: 2019-12-23 | Discharge: 2019-12-23 | Disposition: A | Discharge status: Home / Self Care | Payer: BC Managed Care – PPO | Attending: Emergency Medicine | Admitting: Emergency Medicine

## 2019-12-23 ENCOUNTER — Other Ambulatory Visit: Payer: Self-pay

## 2019-12-23 ENCOUNTER — Emergency Department: Payer: BC Managed Care – PPO

## 2019-12-23 DIAGNOSIS — Z79899 Other long term (current) drug therapy: Secondary | ICD-10-CM | POA: Insufficient documentation

## 2019-12-23 DIAGNOSIS — M79645 Pain in left finger(s): Secondary | ICD-10-CM | POA: Insufficient documentation

## 2019-12-23 DIAGNOSIS — J449 Chronic obstructive pulmonary disease, unspecified: Secondary | ICD-10-CM | POA: Diagnosis not present

## 2019-12-23 DIAGNOSIS — F17210 Nicotine dependence, cigarettes, uncomplicated: Secondary | ICD-10-CM | POA: Insufficient documentation

## 2019-12-23 DIAGNOSIS — I10 Essential (primary) hypertension: Secondary | ICD-10-CM | POA: Insufficient documentation

## 2019-12-23 DIAGNOSIS — M79642 Pain in left hand: Secondary | ICD-10-CM | POA: Diagnosis not present

## 2019-12-23 MED ORDER — ONDANSETRON 4 MG PO TBDP
4.0000 mg | ORAL_TABLET | Freq: Once | ORAL | Status: AC
  Administered 2019-12-23: 4 mg via ORAL
  Filled 2019-12-23: qty 1

## 2019-12-23 MED ORDER — HYDROCODONE-ACETAMINOPHEN 5-325 MG PO TABS
1.0000 | ORAL_TABLET | Freq: Once | ORAL | Status: AC
  Administered 2019-12-23: 1 via ORAL
  Filled 2019-12-23: qty 1

## 2019-12-23 MED ORDER — MELOXICAM 15 MG PO TABS: 15.0000 mg | ORAL_TABLET | Freq: Every day | ORAL | 1 refills | Status: AC

## 2019-12-23 NOTE — Discharge Instructions (Signed)
Take 1 meloxicam daily for pain and inflammation. Wear Velcro wrist splint at night.

## 2019-12-23 NOTE — ED Provider Notes (Signed)
Emergency Department Provider Note  ____________________________________________  Time seen: Approximately 8:46 PM  I have reviewed the triage vital signs and the nursing notes.   HISTORY  Chief Complaint Hand Injury   Historian Patient    HPI Allison Brennan is a 54 y.o. female presents to the emergency department with acute left thumb pain.  Patient localizes pain over the carpometacarpal joint.  She states that she engages in repetitive activities at work.  She has no pain over the first dorsal extensor compartment.  She states the pain does not extend into her forearm.  No numbness or tingling in the left hand.  No other alleviating measures have been attempted.   Past Medical History:  Diagnosis Date  . Back pain, lumbosacral   . COPD (chronic obstructive pulmonary disease) (HCC)    no pulmonologist  . [id number](470.9)   . Hypertension   . PPD positive, treated 1998     Immunizations up to date:  Yes.     Past Medical History:  Diagnosis Date  . Back pain, lumbosacral   . COPD (chronic obstructive pulmonary disease) (HCC)    no pulmonologist  . GGEZMOQH(476.5)   . Hypertension   . PPD positive, treated 1998    Patient Active Problem List   Diagnosis Date Noted  . HLD (hyperlipidemia) 09/10/2017  . Lumbago 11/13/2011  . Essential hypertension 08/29/2011  . COPD (chronic obstructive pulmonary disease) (HCC) 08/29/2011    Past Surgical History:  Procedure Laterality Date  . CESAREAN SECTION     x2  . CLOSED REDUCTION FINGER WITH PERCUTANEOUS PINNING Left 06/17/2015   Procedure: CLOSED REDUCTION FINGER WITH PERCUTANEOUS PINNING little finger;  Surgeon: Kennedy Bucker, MD;  Location: [facility];  Service: Orthopedics;  Laterality: Left;  . EXOSTECTECTOMY TOE Right 02/12/2015   Procedure: EXOSTECTECTOMY TOE;  Surgeon: Linus Galas, MD;  Location: [facility];  Service: Podiatry;  Laterality: Right;  . KNEE ARTHROSCOPY  03/13/2012   Procedure: ARTHROSCOPY KNEE;   Surgeon: Loanne Drilling, MD;  Location: [facility];  Service: Orthopedics;  Laterality: Right;  debridementt right medial meniscus/chrondroplasty  . PARTIAL HYSTERECTOMY  1999   pelvic pain    Prior to Admission medications   Medication Sig Start Date End Date Taking? Authorizing Provider  albuterol (VENTOLIN HFA) 108 (90 Base) MCG/ACT inhaler INHALE 2 PUFFS INTO LUNGS EVERY 4 HOURS AS NEEDED FOR WHEEZING OR  SHORTNESS  OF  BREATH 08/22/19   Lorre Munroe, NP  amLODipine (NORVASC) 5 MG tablet Take 1 tablet (5 mg total) by mouth daily. MUST SCHEDULE PHYSICAL EXAM 04/29/19   Lorre Munroe, NP  conjugated estrogens (PREMARIN) vaginal cream Place 1 Applicatorful vaginally 2 (two) times a week. 12/23/18   Lorre Munroe, NP  fluticasone (FLONASE) 50 MCG/ACT nasal spray Place 2 sprays into both nostrils daily. 12/20/18   Lorre Munroe, NP  Fluticasone-Salmeterol (ADVAIR) 100-50 MCG/DOSE AEPB Inhale 1 puff into the lungs 2 (two) times daily. 12/20/18   Lorre Munroe, NP  meloxicam (MOBIC) 15 MG tablet Take 1 tablet (15 mg total) by mouth daily for 7 days. 12/23/19 12/30/19  Orvil Feil, PA-C  Omega-3 Fatty Acids (FISH OIL) 1000 MG CAPS Take 1 capsule by mouth daily.    [provider]  pantoprazole (PROTONIX) 40 MG tablet Take 1 tablet (40 mg total) by mouth 2 (two) times daily. 11/28/19 12/28/19  Darci Current, MD  predniSONE (DELTASONE) 10 MG tablet Take 6 tabs day 1, 5 tabs day 2,  4 tabs day 3, 3 tabs day 4, 2 tabs day 5, 1 tab day 6 09/17/19   Jearld Fenton, NP  promethazine-dextromethorphan (PROMETHAZINE-DM) 6.25-15 MG/5ML syrup Take 5 mLs by mouth 4 (four) times daily as needed. 09/17/19   Jearld Fenton, NP    Allergies Lisinopril  Family History  Problem Relation Age of Onset  . Diverticulitis Mother   . Deep vein thrombosis Mother   . Arthritis Maternal Grandmother   . Osteoporosis Maternal Grandmother   . Cancer Maternal Aunt        brain  . Breast cancer Neg Hx      Social History Social History   Tobacco Use  . Smoking status: Current Every Day Smoker    Packs/day: 1.00    Years: 28.00    Pack years: 28.00  . Smokeless tobacco: Never Used  . Tobacco comment: Decline Cessation information/SLS  Vaping Use  . Vaping Use: Never used  Substance Use Topics  . Alcohol use: Yes    Alcohol/week: 2.0 standard drinks    Types: 2 Cans of beer per week    Comment: 1-2 drinks beer every day  . Drug use: No     Review of Systems  Constitutional: No fever/chills Eyes:  No discharge ENT: No upper respiratory complaints. Respiratory: no cough. No SOB/ use of accessory muscles to breath Gastrointestinal:   No nausea, no vomiting.  No diarrhea.  No constipation. Musculoskeletal: Patient has left thumb pain.  Skin: Negative for rash, abrasions, lacerations, ecchymosis.    ____________________________________________   PHYSICAL EXAM:  VITAL SIGNS: ED Triage Vitals  Enc Vitals Group     BP 12/23/19 1848 (!) 156/94     Pulse Rate 12/23/19 1848 74     Resp 12/23/19 1848 16     Temp 12/23/19 1848 97.9 F (36.6 C)     Temp Source 12/23/19 1848 Oral     SpO2 12/23/19 1848 97 %     Weight 12/23/19 1846 183 lb (83 kg)     Height 12/23/19 1846 5\' 3"  (1.6 m)     Head Circumference --      Peak Flow --      Pain Score 12/23/19 1846 10     Pain Loc --      Pain Edu? --      Excl. in [place]? --      Constitutional: Alert and oriented. Well appearing and in no acute distress. Eyes: Conjunctivae are normal. PERRL. EOMI. Head: Atraumatic. ENT:      Nose: No congestion/rhinnorhea.      Mouth/Throat: Mucous membranes are moist.  Neck: No stridor.  No cervical spine tenderness to palpation. Cardiovascular: Normal rate, regular rhythm. Normal S1 and S2.  Good peripheral circulation. Respiratory: Normal respiratory effort without tachypnea or retractions. Lungs CTAB. Good air entry to the bases with no decreased or absent breath  sounds Musculoskeletal: Full range of motion to all extremities. No obvious deformities noted.  Negative Finkelstein test.  Negative Tinel and Phalen.  Patient does have tenderness over the carpometacarpal joint of the left first digit. Neurologic:  Normal for age. No gross focal neurologic deficits are appreciated.  Skin:  Skin is warm, dry and intact. No rash noted. Psychiatric: Mood and affect are normal for age. Speech and behavior are normal.   ____________________________________________   LABS (all labs ordered are listed, but only abnormal results are displayed)  Labs Reviewed - No data to display ____________________________________________  EKG   ____________________________________________  RADIOLOGY I, Orvil Feil, personally viewed and evaluated these images (plain radiographs) as part of my medical decision making, as well as reviewing the written report by the radiologist.  DG Hand Complete Left  Result Date: 12/23/2019 CLINICAL DATA:  Left hand and left thumb pain. EXAM: LEFT HAND - COMPLETE 3+ VIEW COMPARISON:  June 14, 2015 FINDINGS: There is no evidence of acute fracture or dislocation. A chronic fracture is seen involving the base of the proximal phalanx of the fifth left finger. Moderate severity degenerative changes are seen along the carpometacarpal articulation of the left thumb. Soft tissues are unremarkable. IMPRESSION: Chronic fracture of the proximal phalanx of the fifth left finger. Electronically Signed   By: Aram Candela M.D.   On: 12/23/2019 19:32    ____________________________________________    PROCEDURES  Procedure(s) performed:     Procedures     Medications  HYDROcodone-acetaminophen (NORCO/VICODIN) 5-325 MG per tablet 1 tablet (1 tablet Oral Given 12/23/19 2050)  ondansetron (ZOFRAN-ODT) disintegrating tablet 4 mg (4 mg Oral Given 12/23/19 2050)     ____________________________________________   INITIAL IMPRESSION /  ASSESSMENT AND PLAN / ED COURSE  Pertinent labs & imaging results that were available during my care of the patient were reviewed by me and considered in my medical decision making (see chart for details).      Assessment and plan Hand pain 54 year old female presents to the emergency department with left first digit pain.  I was concerned about some degenerative changes at the carpometacarpal joint of the left first digit concerning for DJD.  Patient was placed in a Velcro wrist splint and was started on daily meloxicam.  She was advised to follow-up with orthopedics.   ____________________________________________  FINAL CLINICAL IMPRESSION(S) / ED DIAGNOSES  Final diagnoses:  Pain of left thumb      NEW MEDICATIONS STARTED DURING THIS VISIT:  ED Discharge Orders         Ordered    meloxicam (MOBIC) 15 MG tablet  Daily     Discontinue  Reprint     12/23/19 2055              This chart was dictated using voice recognition software/Dragon. Despite best efforts to proofread, errors can occur which can change the meaning. Any change was purely unintentional.     Gasper Lloyd 12/23/19 2142    Sharman Cheek, MD 12/23/19 [phone]

## 2019-12-23 NOTE — ED Triage Notes (Signed)
Pt was at work at 1 am and started with left thumb/hand pain.  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  C/o throbbing.  Mild swelling left thumb.  No obvious deformity.

## 2020-02-16 ENCOUNTER — Other Ambulatory Visit: Payer: Self-pay | Admitting: Internal Medicine

## 2020-03-11 DIAGNOSIS — M2011 Hallux valgus (acquired), right foot: Secondary | ICD-10-CM | POA: Diagnosis not present

## 2020-03-11 DIAGNOSIS — M79671 Pain in right foot: Secondary | ICD-10-CM | POA: Diagnosis not present

## 2020-03-11 DIAGNOSIS — M7671 Peroneal tendinitis, right leg: Secondary | ICD-10-CM | POA: Diagnosis not present

## 2020-03-11 DIAGNOSIS — M65871 Other synovitis and tenosynovitis, right ankle and foot: Secondary | ICD-10-CM | POA: Diagnosis not present

## 2020-05-12 ENCOUNTER — Encounter: Payer: Self-pay | Admitting: Emergency Medicine

## 2020-05-12 ENCOUNTER — Emergency Department: Payer: BC Managed Care – PPO

## 2020-05-12 ENCOUNTER — Emergency Department
Admission: EM | Admit: 2020-05-12 | Discharge: 2020-05-12 | Disposition: A | Discharge status: Home / Self Care | Payer: BC Managed Care – PPO | Attending: Emergency Medicine | Admitting: Emergency Medicine

## 2020-05-12 ENCOUNTER — Other Ambulatory Visit: Payer: Self-pay

## 2020-05-12 DIAGNOSIS — Z20822 Contact with and (suspected) exposure to covid-19: Secondary | ICD-10-CM | POA: Diagnosis not present

## 2020-05-12 DIAGNOSIS — R110 Nausea: Secondary | ICD-10-CM | POA: Diagnosis not present

## 2020-05-12 DIAGNOSIS — R638 Other symptoms and signs concerning food and fluid intake: Secondary | ICD-10-CM | POA: Insufficient documentation

## 2020-05-12 DIAGNOSIS — R42 Dizziness and giddiness: Secondary | ICD-10-CM | POA: Diagnosis not present

## 2020-05-12 DIAGNOSIS — B9789 Other viral agents as the cause of diseases classified elsewhere: Secondary | ICD-10-CM | POA: Diagnosis not present

## 2020-05-12 DIAGNOSIS — I10 Essential (primary) hypertension: Secondary | ICD-10-CM | POA: Diagnosis not present

## 2020-05-12 DIAGNOSIS — E860 Dehydration: Secondary | ICD-10-CM

## 2020-05-12 DIAGNOSIS — R059 Cough, unspecified: Secondary | ICD-10-CM | POA: Diagnosis not present

## 2020-05-12 DIAGNOSIS — J069 Acute upper respiratory infection, unspecified: Secondary | ICD-10-CM | POA: Diagnosis not present

## 2020-05-12 DIAGNOSIS — F17200 Nicotine dependence, unspecified, uncomplicated: Secondary | ICD-10-CM | POA: Insufficient documentation

## 2020-05-12 DIAGNOSIS — Z79899 Other long term (current) drug therapy: Secondary | ICD-10-CM | POA: Diagnosis not present

## 2020-05-12 DIAGNOSIS — R1110 Vomiting, unspecified: Secondary | ICD-10-CM | POA: Diagnosis not present

## 2020-05-12 DIAGNOSIS — J449 Chronic obstructive pulmonary disease, unspecified: Secondary | ICD-10-CM | POA: Insufficient documentation

## 2020-05-12 LAB — RESPIRATORY PANEL BY RT PCR (FLU A&B, COVID)
Influenza A by PCR: NEGATIVE
Influenza B by PCR: NEGATIVE
SARS Coronavirus 2 by RT PCR: NEGATIVE

## 2020-05-12 LAB — BASIC METABOLIC PANEL
Anion gap: 10 (ref 5–15)
BUN: 10 mg/dL (ref 6–20)
CO2: 27 mmol/L (ref 22–32)
Calcium: 9.8 mg/dL (ref 8.9–10.3)
Chloride: 101 mmol/L (ref 98–111)
Creatinine, Ser: 0.67 mg/dL (ref 0.44–1.00)
GFR, Estimated: >60 mL/min (ref >60)
Glucose, Bld: 96 mg/dL (ref 70–99)
Potassium: 4.1 mmol/L (ref 3.5–5.1)
Sodium: 138 mmol/L (ref 135–145)

## 2020-05-12 LAB — CBC
HCT: 48.7 % — ABNORMAL HIGH (ref 36.0–46.0)
Hemoglobin: 16.4 g/dL — ABNORMAL HIGH (ref 12.0–15.0)
MCH: 30.7 pg (ref 26.0–34.0)
MCHC: 33.7 g/dL (ref 30.0–36.0)
MCV: 91 fL (ref 80.0–100.0)
Platelets: 287 10*3/uL (ref 150–400)
RBC: 5.35 MIL/uL — ABNORMAL HIGH (ref 3.87–5.11)
RDW: 12.6 % (ref 11.5–15.5)
WBC: 8.9 10*3/uL (ref 4.0–10.5)
nRBC: 0 % (ref 0.0–0.2)

## 2020-05-12 MED ORDER — DEXAMETHASONE SODIUM PHOSPHATE 10 MG/ML IJ SOLN
10.0000 mg | Freq: Once | INTRAMUSCULAR | Status: AC
  Administered 2020-05-12: 10 mg via INTRAVENOUS
  Filled 2020-05-12: qty 1

## 2020-05-12 MED ORDER — FAMOTIDINE 20 MG PO TABS: 20.0000 mg | ORAL_TABLET | Freq: Two times a day (BID) | ORAL | 0 refills | Status: DC

## 2020-05-12 MED ORDER — ONDANSETRON HCL 4 MG/2ML IJ SOLN
4.0000 mg | Freq: Once | INTRAMUSCULAR | Status: AC
  Administered 2020-05-12: 4 mg via INTRAVENOUS
  Filled 2020-05-12: qty 2

## 2020-05-12 MED ORDER — LACTATED RINGERS IV BOLUS
1000.0000 mL | Freq: Once | INTRAVENOUS | Status: AC
  Administered 2020-05-12: 1000 mL via INTRAVENOUS

## 2020-05-12 MED ORDER — ONDANSETRON 4 MG PO TBDP: 4.0000 mg | ORAL_TABLET | Freq: Three times a day (TID) | ORAL | 0 refills | Status: DC | PRN

## 2020-05-12 MED ORDER — NAPROXEN 500 MG PO TABS: 500.0000 mg | ORAL_TABLET | Freq: Two times a day (BID) | ORAL | 0 refills | Status: DC

## 2020-05-12 NOTE — ED Triage Notes (Signed)
C/O nausea, dizziness, and swollen lymph nodes to neck x 1 day.  AAOx3.  Skin warm and dry. NAD

## 2020-05-12 NOTE — ED Provider Notes (Signed)
[facility] Emergency Department Provider Note  ____________________________________________  Time seen: Approximately 8:01 PM  I have reviewed the triage vital signs and the nursing notes.   HISTORY  Chief Complaint Nausea and Dizziness    HPI Allison Brennan is a 54 y.o. female with a history of COPD hypertension and chronic back pain who comes ED complaining of nausea, decreased oral intake for the past day, feeling dehydrated and lightheaded with standing, swollen lymph nodes in the neck.  Also has nonproductive cough and fatigue.  No known sick contacts.  Symptoms are waxing and waning without aggravating or alleviating factors.  Denies chest pain, denies abdominal pain.      Past Medical History:  Diagnosis Date  . Back pain, lumbosacral   . COPD (chronic obstructive pulmonary disease) (HCC)    no pulmonologist  . NLGXQJJH(417.4)   . Hypertension   . PPD positive, treated 1998     Patient Active Problem List   Diagnosis Date Noted  . HLD (hyperlipidemia) 09/10/2017  . Lumbago 11/13/2011  . Essential hypertension 08/29/2011  . COPD (chronic obstructive pulmonary disease) (HCC) 08/29/2011     Past Surgical History:  Procedure Laterality Date  . CESAREAN SECTION     x2  . CLOSED REDUCTION FINGER WITH PERCUTANEOUS PINNING Left 06/17/2015   Procedure: CLOSED REDUCTION FINGER WITH PERCUTANEOUS PINNING little finger;  Surgeon: Kennedy Bucker, MD;  Location: [facility];  Service: Orthopedics;  Laterality: Left;  . EXOSTECTECTOMY TOE Right 02/12/2015   Procedure: EXOSTECTECTOMY TOE;  Surgeon: Linus Galas, MD;  Location: [facility];  Service: Podiatry;  Laterality: Right;  . KNEE ARTHROSCOPY  03/13/2012   Procedure: ARTHROSCOPY KNEE;  Surgeon: Loanne Drilling, MD;  Location: [facility];  Service: Orthopedics;  Laterality: Right;  debridementt right medial meniscus/chrondroplasty  . PARTIAL HYSTERECTOMY  1999   pelvic pain     Prior to Admission  medications   Medication Sig Start Date End Date Taking? Authorizing Provider  ADVAIR DISKUS 100-50 MCG/DOSE AEPB Inhale 1 puff into the lungs 2 (two) times daily. MUST SCHEDULE PHYSICAL 02/18/20   Lorre Munroe, NP  albuterol (VENTOLIN HFA) 108 (90 Base) MCG/ACT inhaler INHALE 2 PUFFS INTO LUNGS EVERY 4 HOURS AS NEEDED FOR WHEEZING OR  SHORTNESS  OF  BREATH 08/22/19   Lorre Munroe, NP  amLODipine (NORVASC) 5 MG tablet Take 1 tablet (5 mg total) by mouth daily. MUST SCHEDULE PHYSICAL EXAM 04/29/19   Lorre Munroe, NP  conjugated estrogens (PREMARIN) vaginal cream Place 1 Applicatorful vaginally 2 (two) times a week. 12/23/18   Lorre Munroe, NP  famotidine (PEPCID) 20 MG tablet Take 1 tablet (20 mg total) by mouth 2 (two) times daily. 05/12/20   Sharman Cheek, MD  fluticasone [facility]) 50 MCG/ACT nasal spray Place 2 sprays into both nostrils daily. 12/20/18   Lorre Munroe, NP  naproxen (NAPROSYN) 500 MG tablet Take 1 tablet (500 mg total) by mouth 2 (two) times daily with a meal. 05/12/20   Sharman Cheek, MD  Omega-3 Fatty Acids (FISH OIL) 1000 MG CAPS Take 1 capsule by mouth daily.    [provider]  ondansetron (ZOFRAN ODT) 4 MG disintegrating tablet Take 1 tablet (4 mg total) by mouth every 8 (eight) hours as needed for nausea or vomiting. 05/12/20   Sharman Cheek, MD  pantoprazole (PROTONIX) 40 MG tablet Take 1 tablet (40 mg total) by mouth 2 (two) times daily. 11/28/19 12/28/19  Darci Current, MD  predniSONE (DELTASONE)  10 MG tablet Take 6 tabs day 1, 5 tabs day 2, 4 tabs day 3, 3 tabs day 4, 2 tabs day 5, 1 tab day 6 09/17/19   Lorre MunroeBaity, Regina W, NP  promethazine-dextromethorphan (PROMETHAZINE-DM) 6.25-15 MG/5ML syrup Take 5 mLs by mouth 4 (four) times daily as needed. 09/17/19   Lorre MunroeBaity, Regina W, NP     Allergies Lisinopril   Family History  Problem Relation Age of Onset  . Diverticulitis Mother   . Deep vein thrombosis Mother   . Arthritis Maternal  Grandmother   . Osteoporosis Maternal Grandmother   . Cancer Maternal Aunt        brain  . Breast cancer Neg Hx     Social History Social History   Tobacco Use  . Smoking status: Current Every Day Smoker    Packs/day: 1.00    Years: 28.00    Pack years: 28.00  . Smokeless tobacco: Never Used  . Tobacco comment: Decline Cessation information/SLS  Vaping Use  . Vaping Use: Never used  Substance Use Topics  . Alcohol use: Yes    Alcohol/week: 2.0 standard drinks    Types: 2 Cans of beer per week    Comment: 1-2 drinks beer every day  . Drug use: No    Review of Systems  Constitutional:   No fever positive chills.  ENT:   No sore throat. No rhinorrhea. Cardiovascular:   No chest pain or syncope. Respiratory:   No dyspnea positive cough. Gastrointestinal:   Negative for abdominal pain, vomiting and diarrhea.  Musculoskeletal:   Negative for focal pain or swelling All other systems reviewed and are negative except as documented above in ROS and HPI.  ____________________________________________   PHYSICAL EXAM:  VITAL SIGNS: ED Triage Vitals  Enc Vitals Group     BP 05/12/20 1621 (!) 175/106     Pulse Rate 05/12/20 1621 71     Resp 05/12/20 1621 18     Temp 05/12/20 1621 98.3 F (36.8 C)     Temp Source 05/12/20 1621 Oral     SpO2 05/12/20 1621 95 %     Weight 05/12/20 1552 182 lb 15.7 oz (83 kg)     Height 05/12/20 1552 5\' 3"  (1.6 m)     Head Circumference --      Peak Flow --      Pain Score 05/12/20 1551 5     Pain Loc --      Pain Edu? --      Excl. in [place]? --     Vital signs reviewed, nursing assessments reviewed.   Constitutional:   Alert and oriented. Non-toxic appearance. Eyes:   Conjunctivae are normal. EOMI. PERRL. ENT      Head:   Normocephalic and atraumatic.      Nose:   Normal      Mouth/Throat: Dry mucosa, no tonsillar swelling or asymmetry.  Floor mouth soft without tongue elevation.      Neck:   No meningismus. Full  ROM. Hematological/Lymphatic/Immunilogical: Positive bilateral cervical lymphadenopathy. Cardiovascular:   RRR. Symmetric bilateral radial and DP pulses.  No murmurs. Cap refill less than 2 seconds. Respiratory:   Normal respiratory effort without tachypnea/retractions. Breath sounds are clear and equal bilaterally. No wheezes/rales/rhonchi. Gastrointestinal:   Soft and nontender. Non distended. There is no CVA tenderness.  No rebound, rigidity, or guarding. Musculoskeletal:   Normal range of motion in all extremities. No joint effusions.  No lower extremity tenderness.  No edema. Neurologic:  Normal speech and language.  Motor grossly intact. No acute focal neurologic deficits are appreciated.  Skin:    Skin is warm, dry and intact. No rash noted.  No petechiae, purpura, or bullae.  ____________________________________________    LABS (pertinent positives/negatives) (all labs ordered are listed, but only abnormal results are displayed) Labs Reviewed  CBC - Abnormal; Notable for the following components:      Result Value   RBC 5.35 (*)    Hemoglobin 16.4 (*)    HCT 48.7 (*)    All other components within normal limits  RESPIRATORY PANEL BY RT PCR (FLU A&B, COVID)  BASIC METABOLIC PANEL  URINALYSIS, COMPLETE (UACMP) WITH MICROSCOPIC  CBG MONITORING, ED   ____________________________________________   EKG  Interpreted by me Normal sinus rhythm rate of 70, normal axis and intervals.  Normal QRS ST segments and T waves.  No ischemic changes.  ____________________________________________    RADIOLOGY  DG Chest Portable 1 View  Result Date: 05/12/2020 CLINICAL DATA:  Cough, vomiting EXAM: PORTABLE CHEST 1 VIEW COMPARISON:  07/05/2016 FINDINGS: The heart size and mediastinal contours are within normal limits. Both lungs are clear. The visualized skeletal structures are unremarkable. IMPRESSION: No active disease. Electronically Signed   By: Helyn Numbers MD   On: 05/12/2020  19:13    ____________________________________________   PROCEDURES Procedures  ____________________________________________  DIFFERENTIAL DIAGNOSIS   Covid, flu, pneumonia, viral syndrome, dehydration, electrolyte abnormality  CLINICAL IMPRESSION / ASSESSMENT AND PLAN / ED COURSE  Medications ordered in the ED: Medications  ondansetron (ZOFRAN) injection 4 mg (4 mg Intravenous Given 05/12/20 1848)  lactated ringers bolus 1,000 mL (1,000 mLs Intravenous New Bag/Given 05/12/20 1849)  dexamethasone (DECADRON) injection 10 mg (10 mg Intravenous Given 05/12/20 1848)    Pertinent labs & imaging results that were available during my care of the patient were reviewed by me and considered in my medical decision making (see chart for details).  Allison Brennan was evaluated in Emergency Department on 05/12/2020 for the symptoms described in the history of present illness. She was evaluated in the context of the global COVID-19 pandemic, which necessitated consideration that the patient might be at risk for infection with the SARS-CoV-2 virus that causes COVID-19. Institutional protocols and algorithms that pertain to the evaluation of patients at risk for COVID-19 are in a state of rapid change based on information released by regulatory bodies including the CDC and federal and state organizations. These policies and algorithms were followed during the patient's care in the ED.   Patient presents with multitude of symptoms highly likely to be due to a viral illness.  Initial labs unremarkable, vital signs reassuring.  Exam is benign and patient is nontoxic.  Chest x-ray image viewed by me, unremarkable without edema or consolidation.  Radiology report confirms no acute findings.  Covid test is negative.  Patient given IV fluids, Decadron and Zofran for symptom relief.  I will discharge her home with continued supportive care.      ____________________________________________   FINAL  CLINICAL IMPRESSION(S) / ED DIAGNOSES    Final diagnoses:  Viral URI with cough  Dehydration     ED Discharge Orders         Ordered    naproxen (NAPROSYN) 500 MG tablet  2 times daily with meals        05/12/20 1959    famotidine (PEPCID) 20 MG tablet  2 times daily        05/12/20 1959    ondansetron (  ZOFRAN ODT) 4 MG disintegrating tablet  Every 8 hours PRN        05/12/20 1959          Portions of this note were generated with dragon dictation software. Dictation errors may occur despite best attempts at proofreading.   Sharman Cheek, MD 05/12/20 2007

## 2020-05-12 NOTE — Discharge Instructions (Signed)
Your labs, chest x-ray, and Covid test were all okay today.  Please take medications as prescribed to help control your symptoms and make it easier to stay hydrated.  Please follow-up with your doctor in the next few days if you are not feeling better.

## 2020-08-02 ENCOUNTER — Other Ambulatory Visit: Payer: Self-pay | Admitting: Internal Medicine

## 2020-09-08 ENCOUNTER — Encounter: Payer: Self-pay | Admitting: Internal Medicine

## 2020-09-22 ENCOUNTER — Ambulatory Visit (INDEPENDENT_AMBULATORY_CARE_PROVIDER_SITE_OTHER)
Admission: RE | Admit: 2020-09-22 | Discharge: 2020-09-22 | Disposition: A | Discharge status: Home / Self Care | Payer: BC Managed Care – PPO | Source: Ambulatory Visit | Attending: Internal Medicine | Admitting: Internal Medicine

## 2020-09-22 ENCOUNTER — Encounter: Payer: Self-pay | Admitting: Internal Medicine

## 2020-09-22 ENCOUNTER — Ambulatory Visit
Admission: RE | Admit: 2020-09-22 | Discharge: 2020-09-22 | Disposition: A | Discharge status: Home / Self Care | Payer: BC Managed Care – PPO | Source: Ambulatory Visit | Attending: Internal Medicine | Admitting: Internal Medicine

## 2020-09-22 ENCOUNTER — Other Ambulatory Visit: Payer: Self-pay

## 2020-09-22 ENCOUNTER — Ambulatory Visit: Payer: BC Managed Care – PPO | Admitting: Internal Medicine

## 2020-09-22 VITALS — BP 146/94 | HR 70 | Temp 98.4°F | Wt 183.0 lb

## 2020-09-22 DIAGNOSIS — M25562 Pain in left knee: Secondary | ICD-10-CM

## 2020-09-22 DIAGNOSIS — G8929 Other chronic pain: Secondary | ICD-10-CM

## 2020-09-22 DIAGNOSIS — M25561 Pain in right knee: Secondary | ICD-10-CM

## 2020-09-22 DIAGNOSIS — M79645 Pain in left finger(s): Secondary | ICD-10-CM | POA: Diagnosis not present

## 2020-09-22 DIAGNOSIS — M1711 Unilateral primary osteoarthritis, right knee: Secondary | ICD-10-CM | POA: Diagnosis not present

## 2020-09-22 DIAGNOSIS — M79644 Pain in right finger(s): Secondary | ICD-10-CM

## 2020-09-22 DIAGNOSIS — M19041 Primary osteoarthritis, right hand: Secondary | ICD-10-CM | POA: Diagnosis not present

## 2020-09-22 MED ORDER — PREDNISONE 10 MG PO TABS: ORAL_TABLET | ORAL | 0 refills | Status: DC

## 2020-09-22 NOTE — Patient Instructions (Signed)
Joint Pain  Joint pain can be caused by many things. It is likely to go away if you follow instructions from your doctor for taking care of yourself at home. Sometimes, you may need more treatment. Follow these instructions at home: Managing pain, stiffness, and swelling  If told, put ice on the painful area. To do this: ? If you have a removable elastic bandage, sling, or splint, take it off as told by your doctor. ? Put ice in a plastic bag. ? Place a towel between your skin and the bag. ? Leave the ice on for 20 minutes, 2-3 times a day. ? Take off the ice if your skin turns bright red. This is very important. If you cannot feel pain, heat, or cold, you have a greater risk of damage to the area.  Move your fingers or toes below the painful joint often.  Raise the painful joint above the level of your heart while you are sitting or lying down.  If told, put heat on the painful area. Do this as often as told by your doctor. Use the heat source that your doctor recommends, such as a moist heat pack or a heating pad. ? Place a towel between your skin and the heat source. ? Leave the heat on for 20-30 minutes. ? Take off the heat if your skin gets bright red. This is especially important if you are unable to feel pain, heat, or cold. You may have a greater risk of getting burned.      Activity  Rest the painful joint for as long as told by your doctor. Do not do things that cause pain or make your pain worse.  Begin exercising or stretching the affected area, as told by your doctor. Ask your doctor what types of exercise are safe for you.  Return to your normal activities when your doctor says that it is safe. If you have an elastic bandage, sling, or splint:  Wear it as told by your doctor. Take it only as told by your doctor.  Loosen it your fingers or toes below the joint: ? Tingle. ? Become numb. ? Get cold and blue.  Keep it clean.  Ask your doctor if you should take it  off before bathing.  If it is not waterproof: ? Do not let it get wet. ? Cover it with a watertight covering when you take a bath or shower. General instructions  Take over-the-counter and prescription medicines only as told by your doctor. This may include medicines taken by mouth or applied to the skin.  Do not smoke or use any products that contain nicotine or tobacco. If you need help quitting, ask your doctor.  Keep all follow-up visits as told by your doctor. This is important. Contact a doctor if:  You have pain that gets worse and does not get better with medicine.  Your joint pain does not get better in 3 days.  You have more bruising or swelling.  You have a fever.  You lose 10 lb (4.5 kg) or more without trying. Get help right away if:  You cannot move the joint.  Your fingers or toes tingle, become numb. or get cold and blue.  You have a fever along with a joint that is red, warm, and swollen. Summary  Joint pain can be caused by many things. It often goes away if you follow instructions from your doctor for taking care of yourself at home.  Rest the painful joint   for as long as told. Do not do things that cause pain or make your pain worse.  Take over-the-counter and prescription medicines only as told by your doctor. This information is not intended to replace advice given to you by your health care provider. Make sure you discuss any questions you have with your health care provider. Document Revised: 10/01/2019 Document Reviewed: 10/01/2019 Elsevier Patient Education  2021 [employer]  

## 2020-09-22 NOTE — Progress Notes (Signed)
Subjective:    Patient ID: Allison Brennan, female    DOB: [birth year], 55 y.o.   MRN: [id number]  HPI  Patient presents the clinic today with complaint of thumb pain.  This has been a chronic issue on the right, but is now having more pain in her left thumb. She describes the pain as throbbing. She reports associated numbness in her pinky finger on the right, but denies tingling. She denies any injury to the area. She has taken [facility] Powders with minimal relief of symptoms. Xray of the left hand from 2021 showed:  FINDINGS: There is no evidence of acute fracture or dislocation. A chronic fracture is seen involving the base of the proximal phalanx of the fifth left finger. Moderate severity degenerative changes are seen along the carpometacarpal articulation of the left thumb. Soft tissues are unremarkable.   She also reports knee pain.  This has been a chronic issue on her right side for years, but she is now having more pain in her left knee.  She describes the pain as achy. The pain is worse with ambulation and standing for long periods of times. She denies numbness, tingling or weakness of her lower extremities. She has tried BP powders with minimal relief of symptoms. Xray of the left knee from 2017 showed:  Right is numb left is not  IMPRESSION: No acute abnormalities.  Xray of the right knee from 2020 showed:  IMPRESSION: 1. Stable tricompartmental degenerative changes. 2. No acute fracture or osteochondral lesion. 3. Moderate-sized joint effusion.  She had a right MCL repair in 2014.   Review of Systems  Past Medical History:  Diagnosis Date  . Back pain, lumbosacral   . COPD (chronic obstructive pulmonary disease) (HCC)    no pulmonologist  . GEZMOQHU(765.4)   . Hypertension   . PPD positive, treated 1998    Current Outpatient Medications  Medication Sig Dispense Refill  . ADVAIR DISKUS 100-50 MCG/DOSE AEPB INHALE 1 DOSE BY MOUTH TWICE DAILY 60 each 0  .  albuterol (VENTOLIN HFA) 108 (90 Base) MCG/ACT inhaler INHALE 2 PUFFS INTO LUNGS EVERY 4 HOURS AS NEEDED FOR WHEEZING OR  SHORTNESS  OF  BREATH 18 g 3  . amLODipine (NORVASC) 5 MG tablet Take 1 tablet (5 mg total) by mouth daily. MUST SCHEDULE PHYSICAL EXAM 30 tablet 0  . conjugated estrogens (PREMARIN) vaginal cream Place 1 Applicatorful vaginally 2 (two) times a week. 42.5 g 12  . famotidine (PEPCID) 20 MG tablet Take 1 tablet (20 mg total) by mouth 2 (two) times daily. 60 tablet 0  . fluticasone (FLONASE) 50 MCG/ACT nasal spray Place 2 sprays into both nostrils daily. 16 g 2  . naproxen (NAPROSYN) 500 MG tablet Take 1 tablet (500 mg total) by mouth 2 (two) times daily with a meal. 20 tablet 0  . Omega-3 Fatty Acids (FISH OIL) 1000 MG CAPS Take 1 capsule by mouth daily.    . ondansetron (ZOFRAN ODT) 4 MG disintegrating tablet Take 1 tablet (4 mg total) by mouth every 8 (eight) hours as needed for nausea or vomiting. 20 tablet 0  . pantoprazole (PROTONIX) 40 MG tablet Take 1 tablet (40 mg total) by mouth 2 (two) times daily. 60 tablet 0  . predniSONE (DELTASONE) 10 MG tablet Take 6 tabs day 1, 5 tabs day 2, 4 tabs day 3, 3 tabs day 4, 2 tabs day 5, 1 tab day 6 21 tablet 0  . promethazine-dextromethorphan (PROMETHAZINE-DM) 6.25-15 MG/5ML syrup Take 5  mLs by mouth 4 (four) times daily as needed. 118 mL 0   No current facility-administered medications for this visit.    Allergies  Allergen Reactions  . Lisinopril Cough    Family History  Problem Relation Age of Onset  . Diverticulitis Mother   . Deep vein thrombosis Mother   . Arthritis Maternal Grandmother   . Osteoporosis Maternal Grandmother   . Cancer Maternal Aunt        brain  . Breast cancer Neg Hx     Social History   Socioeconomic History  . Marital status: Widowed    Spouse name: Not on file  . Number of children: 2  . Years of education: Not on file  . Highest education level: Not on file  Occupational History  .  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 Smoking status: Current Every Day Smoker    Packs/day: 1.00    Years: 28.00    Pack years: 28.00  . Smokeless tobacco: Never Used  . Tobacco comment: Decline Cessation information/SLS  Vaping Use  . Vaping Use: Never used  Substance and Sexual Activity  . Alcohol use: Yes    Alcohol/week: 2.0 standard drinks    Types: 2 Cans of beer per week    Comment: 1-2 drinks beer every day  . Drug use: No  . Sexual activity: Not Currently  Other Topics Concern  . Not on file  Social History Narrative   Lives alone in [place], no pets. Both children living with her.       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       Regular Exercise -  NO   Daily Caffeine Use:  6 - 8 sodas   Diet-   Social Determinants of Health   Financial Resource Strain: Not on file  Food Insecurity: Not on file  Transportation Needs: Not on file  Physical Activity: Not on file  Stress: Not on file  Social Connections: Not on file  Intimate Partner Violence: Not on file     Constitutional: Denies fever, malaise, fatigue, headache or abrupt weight changes.  Respiratory: Denies difficulty breathing, shortness of breath, cough or sputum production.   Cardiovascular: Denies chest pain, chest tightness, palpitations or swelling in the hands or feet.  Musculoskeletal: Patient reports bilateral thumb and bilateral knee pain.  Denies decrease in range of motion, difficulty with gait, muscle pain or joint swelling.  Skin: Denies redness, rashes, lesions or ulcercations.  Neurological: Pt reports numbness of right pinky finger. Denies dizziness, difficulty with memory, difficulty with speech or problems with balance and coordination.    No other specific complaints in a complete review of systems (except as listed in HPI above).     Objective:   Physical Exam  BP (!) 146/94   Pulse 70   Temp 98.4 F (36.9 C) (Temporal)   Wt 183 lb (83 kg)   SpO2  96%   BMI 32.42 kg/m   Wt Readings from Last 3 Encounters:  05/12/20 182 lb 15.7 oz (83 kg)  12/23/19 183 lb (83 kg)  11/27/19 188 lb (85.3 kg)    General: Appears her stated age, obese, in NAD. Skin: Warm, dry and intact. No rashes, redness or warmth over the joints. Cardiovascular: Normal rate and rhythm. S1,S2 noted.  No murmur, rubs or gallops noted. Trace pitting edema RLE. No LLE edema. Cap refill < 3 secs bilaterally. Pulmonary/Chest: Normal effort and positive vesicular breath sounds.  No respiratory distress. No wheezes, rales or ronchi noted.  Musculoskeletal: Normal flexion and extension of the thumbs. No joint enlargement noted. Pain with palpation over the MCP. Normal flexion and extension of bilateral knees. Joint enlargement noted of right knee. Pain with palpation of bilateral medial joint lines. Negative Lachman's, negative McMurray. Strength 5/5 BLE. No difficulty with gait. Neurological: Alert and oriented.    BMET    Component Value Date/Time   NA 138 05/12/2020 1553   K 4.1 05/12/2020 1553   CL 101 05/12/2020 1553   CO2 27 05/12/2020 1553   GLUCOSE 96 05/12/2020 1553   BUN 10 05/12/2020 1553   CREATININE 0.67 05/12/2020 1553   CREATININE 0.77 12/20/2018 1501   CALCIUM 9.8 05/12/2020 1553   GFRNONAA >60 05/12/2020 1553   GFRNONAA 88 12/20/2018 1501   GFRAA >60 11/27/2019 1448   GFRAA 101 12/20/2018 1501    Lipid Panel     Component Value Date/Time   CHOL 215 (H) 12/20/2018 1501   TRIG 567 (H) 12/20/2018 1501   HDL 30 (L) 12/20/2018 1501   CHOLHDL 7.2 (H) 12/20/2018 1501   VLDL 77.4 (H) 04/10/2016 0812   LDLCALC  12/20/2018 1501     Comment:     . LDL cholesterol not calculated. Triglyceride levels greater than 400 mg/dL invalidate calculated LDL results. . Reference range: <100 . Desirable range <100 mg/dL for primary prevention;   <70 mg/dL for patients with CHD or diabetic patients  with > or = 2 CHD risk factors. Marland Kitchen LDL-C is now calculated  using the Martin-Hopkins  calculation, which is a validated novel method providing  better accuracy than the Friedewald equation in the  estimation of LDL-C.  Horald Pollen et al. Lenox Ahr. 1025;852(77): 2061-2068  (http://education.[facility].com/faq/FAQ164)     CBC    Component Value Date/Time   WBC 8.9 05/12/2020 1553   RBC 5.35 (H) 05/12/2020 1553   HGB 16.4 (H) 05/12/2020 1553   HCT 48.7 (H) 05/12/2020 1553   PLT 287 05/12/2020 1553   MCV 91.0 05/12/2020 1553   MCH 30.7 05/12/2020 1553   MCHC 33.7 05/12/2020 1553   RDW 12.6 05/12/2020 1553   LYMPHSABS 2.1 04/10/2016 0812   MONOABS 0.6 04/10/2016 0812   EOSABS 0.3 04/10/2016 0812   BASOSABS 0.1 04/10/2016 0812    Hgb A1C Lab Results  Component Value Date   HGBA1C 5.4 12/20/2018           Assessment & Plan:  Bilateral Thumb Pain:  Xray right thumb today RX for Pred Taper x 9 days- avoid BC powders while on Prednisone  Bilateral Knee Pain:  Xray bilateral knees today RX for Pred Taper x 9 days- avoid BC powders while on Prednisone Discussed how weight loss could help improve joint pain  Will follow up after xrays, return precautions discussed  Nicki Reaper, NP This visit occurred during the SARS-CoV-2 public health emergency.  Safety protocols were in place, including screening questions prior to the visit, additional usage of staff PPE, and extensive cleaning of exam room while observing appropriate contact time as indicated for disinfecting solutions.

## 2020-09-24 ENCOUNTER — Encounter: Payer: Self-pay | Admitting: Internal Medicine

## 2020-10-02 ENCOUNTER — Other Ambulatory Visit: Payer: Self-pay | Admitting: Internal Medicine

## 2020-10-02 DIAGNOSIS — M25562 Pain in left knee: Secondary | ICD-10-CM

## 2020-10-02 DIAGNOSIS — G8929 Other chronic pain: Secondary | ICD-10-CM

## 2020-10-02 DIAGNOSIS — M79644 Pain in right finger(s): Secondary | ICD-10-CM

## 2020-10-04 ENCOUNTER — Emergency Department
Admission: EM | Admit: 2020-10-04 | Discharge: 2020-10-04 | Disposition: A | Discharge status: Home / Self Care | Payer: BC Managed Care – PPO | Attending: Emergency Medicine | Admitting: Emergency Medicine

## 2020-10-04 ENCOUNTER — Other Ambulatory Visit: Payer: Self-pay

## 2020-10-04 ENCOUNTER — Emergency Department: Payer: BC Managed Care – PPO

## 2020-10-04 DIAGNOSIS — J449 Chronic obstructive pulmonary disease, unspecified: Secondary | ICD-10-CM | POA: Insufficient documentation

## 2020-10-04 DIAGNOSIS — I10 Essential (primary) hypertension: Secondary | ICD-10-CM | POA: Diagnosis not present

## 2020-10-04 DIAGNOSIS — F17200 Nicotine dependence, unspecified, uncomplicated: Secondary | ICD-10-CM | POA: Insufficient documentation

## 2020-10-04 DIAGNOSIS — R109 Unspecified abdominal pain: Secondary | ICD-10-CM | POA: Diagnosis not present

## 2020-10-04 DIAGNOSIS — R1031 Right lower quadrant pain: Secondary | ICD-10-CM | POA: Diagnosis not present

## 2020-10-04 DIAGNOSIS — M545 Low back pain, unspecified: Secondary | ICD-10-CM | POA: Diagnosis not present

## 2020-10-04 DIAGNOSIS — Z7951 Long term (current) use of inhaled steroids: Secondary | ICD-10-CM | POA: Diagnosis not present

## 2020-10-04 DIAGNOSIS — D72829 Elevated white blood cell count, unspecified: Secondary | ICD-10-CM | POA: Diagnosis not present

## 2020-10-04 DIAGNOSIS — K253 Acute gastric ulcer without hemorrhage or perforation: Secondary | ICD-10-CM | POA: Insufficient documentation

## 2020-10-04 MED ORDER — MORPHINE SULFATE (PF) 4 MG/ML IV SOLN
4.0000 mg | Freq: Once | INTRAVENOUS | Status: AC
  Administered 2020-10-04: 4 mg via INTRAVENOUS
  Filled 2020-10-04: qty 1

## 2020-10-04 MED ORDER — ALUM & MAG HYDROXIDE-SIMETH 200-200-20 MG/5ML PO SUSP
30.0000 mL | Freq: Once | ORAL | Status: AC
  Administered 2020-10-04: 30 mL via ORAL
  Filled 2020-10-04: qty 30

## 2020-10-04 MED ORDER — LIDOCAINE VISCOUS HCL 2 % MT SOLN
15.0000 mL | Freq: Once | OROMUCOSAL | Status: AC
  Administered 2020-10-04: 15 mL via ORAL
  Filled 2020-10-04: qty 15

## 2020-10-04 MED ORDER — PANTOPRAZOLE SODIUM 40 MG PO TBEC: 40.0000 mg | DELAYED_RELEASE_TABLET | Freq: Every day | ORAL | 1 refills | Status: DC

## 2020-10-04 MED ORDER — ONDANSETRON HCL 4 MG/2ML IJ SOLN
4.0000 mg | Freq: Once | INTRAMUSCULAR | Status: AC
  Administered 2020-10-04: 4 mg via INTRAVENOUS
  Filled 2020-10-04: qty 2

## 2020-10-04 MED ORDER — IOHEXOL 300 MG/ML  SOLN
100.0000 mL | Freq: Once | INTRAMUSCULAR | Status: AC | PRN
  Administered 2020-10-04: 100 mL via INTRAVENOUS

## 2020-10-04 MED ORDER — PANTOPRAZOLE SODIUM 40 MG IV SOLR
40.0000 mg | Freq: Once | INTRAVENOUS | Status: AC
  Administered 2020-10-04: 40 mg via INTRAVENOUS
  Filled 2020-10-04: qty 40

## 2020-10-04 NOTE — ED Provider Notes (Signed)
-----------------------------------------   10:54 PM on 10/04/2020 -----------------------------------------  Patient's pain is improved after GI cocktail.  We will discharge patient home with Protonix and PCP/GI follow-up.  Patient agreeable to plan of care.   Minna Antis, MD 10/04/20 2255

## 2020-10-04 NOTE — ED Provider Notes (Signed)
[facility] Emergency Department Provider Note ____________________________________________   Event Date/Time   First MD Initiated Contact with Patient 10/04/20 1844     (approximate)  I have reviewed the triage vital signs and the nursing notes.  HISTORY  Chief Complaint Flank Pain   HPI Allison Brennan is a 55 y.o. femalewho presents to the ED for evaluation of flank pain.   Chart review indicates hx HTN and COPD. Patient was at a neighboring walk-in clinic, [facility]: Earlier this afternoon.  CBC, CMP and urinalysis from this visit were reviewed by me.  CMP without acute derangements, urinalysis with rare bacteria and 0-3 whites, negative leuk esterases and negative nitrite.  CBC with white count of 15.3k without left shift and neutrophilia. Patient with a history of hysterectomy, no further intra-abdominal surgical history.  Patient presents to the ED for evaluation of 2 days of right-sided abdominal/flank pain.  She reports the pain has been constant to her right side and progressively worsening over the past 2 days.  She denies any symptoms beyond the pain.  Denies nausea, emesis, dysuria, stool changes or diarrhea, denies radiation of the pain, denies fevers.   Past Medical History:  Diagnosis Date  . Back pain, lumbosacral   . COPD (chronic obstructive pulmonary disease) (HCC)    no pulmonologist  . [id number](833.8)   . Hypertension   . PPD positive, treated 1998    Patient Active Problem List   Diagnosis Date Noted  . Chronic pain of both knees 09/22/2020  . Chronic thumb pain, bilateral 09/22/2020  . HLD (hyperlipidemia) 09/10/2017  . Lumbago 11/13/2011  . Essential hypertension 08/29/2011    Past Surgical History:  Procedure Laterality Date  . CESAREAN SECTION     x2  . CLOSED REDUCTION FINGER WITH PERCUTANEOUS PINNING Left 06/17/2015   Procedure: CLOSED REDUCTION FINGER WITH PERCUTANEOUS PINNING little finger;  Surgeon: Kennedy Bucker, MD;  Location: [facility] ORS;  Service: Orthopedics;  Laterality: Left;  . EXOSTECTECTOMY TOE Right 02/12/2015   Procedure: EXOSTECTECTOMY TOE;  Surgeon: Linus Galas, MD;  Location: [facility] ORS;  Service: Podiatry;  Laterality: Right;  . KNEE ARTHROSCOPY  03/13/2012   Procedure: ARTHROSCOPY KNEE;  Surgeon: Loanne Drilling, MD;  Location: [facility] ORS;  Service: Orthopedics;  Laterality: Right;  debridementt right medial meniscus/chrondroplasty  . PARTIAL HYSTERECTOMY  1999   pelvic pain    Prior to Admission medications   Medication Sig Start Date End Date Taking? Authorizing Provider  pantoprazole (PROTONIX) 40 MG tablet Take 1 tablet (40 mg total) by mouth daily. 10/04/20 10/04/21 Yes Delton Prairie, MD  ADVAIR DISKUS 100-50 MCG/DOSE AEPB INHALE 1 DOSE BY MOUTH TWICE DAILY 08/03/20   Lorre Munroe, NP  albuterol (VENTOLIN HFA) 108 (90 Base) MCG/ACT inhaler INHALE 2 PUFFS INTO LUNGS EVERY 4 HOURS AS NEEDED FOR WHEEZING OR  SHORTNESS  OF  BREATH 08/22/19   Lorre Munroe, NP  Omega-3 Fatty Acids (FISH OIL) 1000 MG CAPS Take 1 capsule by mouth daily.    [provider]  predniSONE (DELTASONE) 10 MG tablet Take 3 tabs on days 1-3, take 2 tabs on days 4-6, take 1 tab on days 7-9 09/22/20   Lorre Munroe, NP    Allergies Lisinopril  Family History  Problem Relation Age of Onset  . Diverticulitis Mother   . Deep vein thrombosis Mother   . Arthritis Maternal Grandmother   . Osteoporosis Maternal Grandmother   . Cancer Maternal Aunt  brain  . Breast cancer Neg Hx     Social History Social History   Tobacco Use  . Smoking status: Current Every Day Smoker    Packs/day: 1.00    Years: 28.00    Pack years: 28.00  . Smokeless tobacco: Never Used  . Tobacco comment: Decline Cessation information/SLS  Vaping Use  . Vaping Use: Never used  Substance Use Topics  . Alcohol use: Yes    Alcohol/week: 2.0 standard drinks    Types: 2 Cans of beer per week    Comment: 1-2 drinks beer every  day  . Drug use: No    Review of Systems  Constitutional: No fever/chills Eyes: No visual changes. ENT: No sore throat. Cardiovascular: Denies chest pain. Respiratory: Denies shortness of breath. Gastrointestinal: Positive for right flank/RLQ pain.  No nausea, no vomiting.  No diarrhea.  No constipation. Genitourinary: Negative for dysuria. Musculoskeletal: Negative for back pain. Skin: Negative for rash. Neurological: Negative for headaches, focal weakness or numbness.  ____________________________________________   PHYSICAL EXAM:  VITAL SIGNS: Vitals:   10/04/20 1839  BP: (!) 172/97  Pulse: 80  Resp: 20  Temp: 99.8 F (37.7 C)  SpO2: 97%     Constitutional: Alert and oriented. Well appearing and in no acute distress. Eyes: Conjunctivae are normal. PERRL. EOMI. Head: Atraumatic. Nose: No congestion/rhinnorhea. Mouth/Throat: Mucous membranes are moist.  Oropharynx non-erythematous. Neck: No stridor. No cervical spine tenderness to palpation. Cardiovascular: Normal rate, regular rhythm. Grossly normal heart sounds.  Good peripheral circulation. Respiratory: Normal respiratory effort.  No retractions. Lungs CTAB. Gastrointestinal: Soft , nondistended Poorly localizing tenderness throughout the right-sided flank, RLQ abdomen.  No peritoneal features.  Left-sided abdomen  is benign and nontender. Musculoskeletal: No lower extremity tenderness nor edema.  No joint effusions. No signs of acute trauma. Neurologic:  Normal speech and language. No gross focal neurologic deficits are appreciated. No gait instability noted. Skin:  Skin is warm, dry and intact. No rash noted. Psychiatric: Mood and affect are normal. Speech and behavior are normal.  ____________________________________________   LABS (all labs ordered are listed, but only abnormal results are displayed)  Outpatient labs reviewed, as per HPI ____________________________________________  RADIOLOGY  ED MD  interpretation: CT reviewed by me  Official radiology report(s): CT ABDOMEN PELVIS W CONTRAST  Result Date: 10/04/2020 CLINICAL DATA:  55 year old female with right lower quadrant abdominal pain. EXAM: CT ABDOMEN AND PELVIS WITH CONTRAST TECHNIQUE: Multidetector CT imaging of the abdomen and pelvis was performed using the standard protocol following bolus administration of intravenous contrast. CONTRAST:  OMNIPAQUE IOHEXOL 300 MG/ML  SOLN COMPARISON:  CT abdomen pelvis dated 11/28/2019. FINDINGS: Lower chest: The visualized lung bases are clear. No intra-abdominal free air or free fluid. Hepatobiliary: Fatty liver. No intrahepatic biliary dilatation. The gallbladder is unremarkable. Pancreas: Unremarkable. No pancreatic ductal dilatation or surrounding inflammatory changes. Spleen: Normal in size without focal abnormality. Adrenals/Urinary Tract: The adrenal glands unremarkable. The kidneys, visualized ureters, and urinary bladder appear unremarkable. Stomach/Bowel: There is thickened appearance of the inferior wall of the gastric antrum with a 1 cm focal area of apparent ulceration (coronal 41/5 and axial 25/2) concerning for a gastric ulcer. Further evaluation with endoscopy is recommended. There is a small duodenal diverticulum. There is colonic diverticulosis without active inflammatory changes. There is no bowel obstruction or active inflammation. The appendix is normal. Vascular/Lymphatic: Mild aortoiliac atherosclerotic disease. The IVC is unremarkable. No portal venous gas. There is no adenopathy. Reproductive: Hysterectomy.  No adnexal masses. Other: None  Musculoskeletal: Mild degenerative changes of the spine. No acute osseous pathology. IMPRESSION: 1. Findings concerning for a distal gastric ulcer. Further evaluation with endoscopy is recommended. 2. Colonic diverticulosis. No bowel obstruction. Normal appendix. 3. Fatty liver. 4. Aortic Atherosclerosis (ICD10-I70.0). Electronically Signed   By:  Elgie Collard M.D.   On: 10/04/2020 21:45    ____________________________________________   PROCEDURES and INTERVENTIONS  Procedure(s) performed (including Critical Care):  Procedures  Medications  alum & mag hydroxide-simeth (MAALOX/MYLANTA) 200-200-20 MG/5ML suspension 30 mL (has no administration in time range)    And  lidocaine (XYLOCAINE) 2 % viscous mouth solution 15 mL (has no administration in time range)  pantoprazole (PROTONIX) injection 40 mg (has no administration in time range)  morphine 4 MG/ML injection 4 mg (4 mg Intravenous Given 10/04/20 2017)  ondansetron (ZOFRAN) injection 4 mg (4 mg Intravenous Given 10/04/20 2016)  morphine 4 MG/ML injection 4 mg (4 mg Intravenous Given 10/04/20 2151)  iohexol (OMNIPAQUE) 300 MG/ML solution 100 mL (100 mLs Intravenous Contrast Given 10/04/20 2113)    ____________________________________________   MDM / ED COURSE   55 year old woman presents to the ED with isolated right-sided abdominal pain, with evidence of gastric ulcers as a likely source of her pain.  Slightly hypertensive, but otherwise normal vitals on room air.  Exam with poorly localizing right-sided abdominal tenderness without peritoneal features.  She otherwise looks well without distress, signs of dehydration, neurovascular deficits or any distress.  Blood work from outpatient visit earlier today reviewed with nonspecific leukocytosis to 15,000, otherwise unremarkable.  Due to this leukocytosis and her pain, CT imaging obtained of her abdomen to ensure no acute appendicitis.  This shows no evidence of acute appendicitis, SBO, but does have evidence of gastric ulcers as a possible source of her pain.  Considering the poor localization of her pain and she otherwise looks well, I think this is a reasonable etiology of her pain.  We will treat for this with PPI and GI cocktail to ensure her pain improves.  If so, she will be discharged with PPI and GI follow-up as an outpatient.   We discussed return precautions for the ED.  Patient signed out to oncoming provider to follow-up on response after these medications prior to expected discharge.  Clinical Course as of 10/04/20 2200  Mon Oct 04, 2020  2050 Reassessed.  Persistent pain despite morphine.  Awaiting CT.  We will redose [DS]  2158 Educated patient of CT results and plan for GI cocktail and PPI and IV.  We discussed if her pain is controlled then outpatient management with GI follow-up.  We discussed return precautions for the ED.  Patient signed out to oncoming provider to follow-up on her clinical response to these medications [DS]    Clinical Course User Index [DS] Delton Prairie, MD    ____________________________________________   FINAL CLINICAL IMPRESSION(S) / ED DIAGNOSES  Final diagnoses:  Right lower quadrant abdominal pain  Acute gastric ulcer without hemorrhage or perforation     ED Discharge Orders         Ordered    pantoprazole (PROTONIX) 40 MG tablet  Daily        10/04/20 2159           Delton Prairie   Note:  This document was prepared using Dragon voice recognition software and may include unintentional dictation errors.   Delton Prairie, MD 10/04/20 2204

## 2020-10-04 NOTE — ED Notes (Signed)
See triage note and pain assmt. EDP at bedside. Pt has R flank and R back pain that started yestereday. Denies urinary symptoms. LBM today, normal. Pt in NAD.

## 2020-10-04 NOTE — ED Notes (Signed)
E-signature completed and signed on paper, placed in medical records.

## 2020-10-04 NOTE — ED Triage Notes (Signed)
Pt inw with co right flank pain since yesterday, denies any hx of the same or problems urinating. Pt went to [facility] and was sent here due to increased wbc. Blood and urine results available in epic.

## 2020-10-04 NOTE — Discharge Instructions (Addendum)
As we discussed, your pain is likely due to ulcers in your stomach.  You are being discharged with a medicine called Protonix to help reduce the acid in your stomach.  Please take 1 pill daily moving forward to help reduce acid in the stomach.  Please follow-up with a local GI doctor, I have attached the phone number for Dr. Mia Creek.  They will need to perform an endoscopy, which is the camera down the throat to look at the stomach from the inside.  The  If you develop any further worsening pain despite these medications, particularly with bleeding with vomiting, please return to the ED.

## 2020-12-14 ENCOUNTER — Encounter: Payer: Self-pay | Admitting: Emergency Medicine

## 2020-12-14 ENCOUNTER — Emergency Department: Payer: BC Managed Care – PPO

## 2020-12-14 ENCOUNTER — Emergency Department
Admission: EM | Admit: 2020-12-14 | Discharge: 2020-12-14 | Disposition: A | Discharge status: Home / Self Care | Payer: BC Managed Care – PPO | Attending: Emergency Medicine | Admitting: Emergency Medicine

## 2020-12-14 ENCOUNTER — Other Ambulatory Visit: Payer: Self-pay

## 2020-12-14 DIAGNOSIS — S92425A Nondisplaced fracture of distal phalanx of left great toe, initial encounter for closed fracture: Secondary | ICD-10-CM | POA: Diagnosis not present

## 2020-12-14 DIAGNOSIS — F17210 Nicotine dependence, cigarettes, uncomplicated: Secondary | ICD-10-CM | POA: Diagnosis not present

## 2020-12-14 DIAGNOSIS — J449 Chronic obstructive pulmonary disease, unspecified: Secondary | ICD-10-CM | POA: Insufficient documentation

## 2020-12-14 DIAGNOSIS — I10 Essential (primary) hypertension: Secondary | ICD-10-CM | POA: Diagnosis not present

## 2020-12-14 DIAGNOSIS — W228XXA Striking against or struck by other objects, initial encounter: Secondary | ICD-10-CM | POA: Insufficient documentation

## 2020-12-14 DIAGNOSIS — S99922A Unspecified injury of left foot, initial encounter: Secondary | ICD-10-CM | POA: Diagnosis not present

## 2020-12-14 DIAGNOSIS — T1490XA Injury, unspecified, initial encounter: Secondary | ICD-10-CM

## 2020-12-14 MED ORDER — HYDROCODONE-ACETAMINOPHEN 5-325 MG PO TABS: 2.0000 | ORAL_TABLET | Freq: Four times a day (QID) | ORAL | 0 refills | Status: DC | PRN

## 2020-12-14 MED ORDER — DOCUSATE SODIUM 100 MG PO CAPS
ORAL_CAPSULE | ORAL | 0 refills | Status: DC
Start: 2020-12-14 — End: 2021-10-10

## 2020-12-14 NOTE — ED Triage Notes (Signed)
Patient ambulatory to triage with steady gait, without difficulty or distress noted; pt reports left great toe pain after hitting it on a wall tonight

## 2020-12-14 NOTE — ED Provider Notes (Signed)
[facility] Emergency Department Provider Note  ____________________________________________   Event Date/Time   First MD Initiated Contact with Patient 12/14/20 [phone]     (approximate)  I have reviewed the triage vital signs and the nursing notes.   HISTORY  Chief Complaint Toe Pain    HPI Allison Brennan is a 55 y.o. female with medical history as listed below who presents for evaluation of pain in her left great toe.  She accidentally kicked a wall in her bedroom shoes after seeing a bug on the floor and had acute onset of moderate to severe pain in her left big toe.  This occurred about 24 hours ago.  She worked yesterday and the pain got worse and she has some bruising spread on the top of her foot in front of several of the adjacent toes.  Walking on it makes it worse, nothing particular makes it better.  No acute numbness or tingling.  No other injuries.     Past Medical History:  Diagnosis Date   Back pain, lumbosacral    COPD (chronic obstructive pulmonary disease) (HCC)    no pulmonologist   Headache(784.0)    Hypertension    PPD positive, treated 1998    Patient Active Problem List   Diagnosis Date Noted   Chronic pain of both knees 09/22/2020   Chronic thumb pain, bilateral 09/22/2020   HLD (hyperlipidemia) 09/10/2017   Lumbago 11/13/2011   Essential hypertension 08/29/2011    Past Surgical History:  Procedure Laterality Date   CESAREAN SECTION     x2   CLOSED REDUCTION FINGER WITH PERCUTANEOUS PINNING Left 06/17/2015   Procedure: CLOSED REDUCTION FINGER WITH PERCUTANEOUS PINNING little finger;  Surgeon: Kennedy Bucker, MD;  Location: [facility] ORS;  Service: Orthopedics;  Laterality: Left;   EXOSTECTECTOMY TOE Right 02/12/2015   Procedure: EXOSTECTECTOMY TOE;  Surgeon: Linus Galas, MD;  Location: [facility] OR[facility];  Service: Podiatry;  Laterality: Right;   KNEE ARTHROSCOPY  03/13/2012   Procedure: ARTHROSCOPY KNEE;  Surgeon: Loanne Drilling, MD;   Location: [facility];  Service: Orthopedics;  Laterality: Right;  debridementt right medial meniscus/chrondroplasty   PARTIAL HYSTERECTOMY  1999   pelvic pain    Prior to Admission medications   Medication Sig Start Date End Date Taking? Authorizing Provider  docusate sodium (COLACE) 100 MG capsule Take 1 tablet once or twice daily as needed for constipation while taking narcotic pain medicine 12/14/20  Yes Loleta Rose, MD  HYDROcodone-acetaminophen (NORCO/VICODIN) 5-325 MG tablet Take 2 tablets by mouth every 6 (six) hours as needed for moderate pain or severe pain. 12/14/20  Yes Loleta Rose, MD  ADVAIR DISKUS 100-50 MCG/DOSE AEPB INHALE 1 DOSE BY MOUTH TWICE DAILY 08/03/20   Lorre Munroe, NP  albuterol (VENTOLIN HFA) 108 (90 Base) MCG/ACT inhaler INHALE 2 PUFFS INTO LUNGS EVERY 4 HOURS AS NEEDED FOR WHEEZING OR  SHORTNESS  OF  BREATH 08/22/19   Lorre Munroe, NP  Omega-3 Fatty Acids (FISH OIL) 1000 MG CAPS Take 1 capsule by mouth daily.    [provider]  pantoprazole (PROTONIX) 40 MG tablet Take 1 tablet (40 mg total) by mouth daily. 10/04/20 10/04/21  Delton Prairie, MD  predniSONE (DELTASONE) 10 MG tablet Take 3 tabs on days 1-3, take 2 tabs on days 4-6, take 1 tab on days 7-9 09/22/20   Lorre Munroe, NP    Allergies Lisinopril  Family History  Problem Relation Age of Onset   Diverticulitis Mother  Deep vein thrombosis Mother    Arthritis Maternal Grandmother    Osteoporosis Maternal Grandmother    Cancer Maternal Aunt        brain   Breast cancer Neg Hx     Social History Social History   Tobacco Use   Smoking status: Every Day    Packs/day: 1.00    Years: 28.00    Pack years: 28.00    Types: Cigarettes   Smokeless tobacco: Never   Tobacco comments:    Decline Cessation information/SLS  Vaping Use   Vaping Use: Never used  Substance Use Topics   Alcohol use: Yes    Alcohol/week: 2.0 standard drinks    Types: 2 Cans of beer per week    Comment: 1-2 drinks  beer every day   Drug use: No    Review of Systems Constitutional: No fever/chills Cardiovascular: Denies chest pain. Respiratory: Denies shortness of breath. Gastrointestinal: No abdominal pain. Musculoskeletal: Acute onset left great toe pain. Integumentary: Negative for rash. Neurological: Negative for headaches, focal weakness or numbness.   ____________________________________________   PHYSICAL EXAM:  VITAL SIGNS: ED Triage Vitals  Enc Vitals Group     BP 12/14/20 0324 (!) 164/96     Pulse Rate 12/14/20 0324 72     Resp 12/14/20 0324 18     Temp 12/14/20 0324 98.2 F (36.8 C)     Temp Source 12/14/20 0324 Oral     SpO2 12/14/20 0324 96 %     Weight 12/14/20 0323 83.9 kg (185 lb)     Height 12/14/20 0323 1.6 m (5\' 3" )     Head Circumference --      Peak Flow --      Pain Score 12/14/20 0323 10     Pain Loc --      Pain Edu? --      Excl. in GC? --     Constitutional: Alert and oriented.  Eyes: Conjunctivae are normal.  Head: Atraumatic. Cardiovascular: Normal rate, regular rhythm. Good peripheral circulation. Respiratory: Normal respiratory effort.  No retractions. Musculoskeletal: No lower extremity tenderness nor edema. No gross deformities of extremities. Neurologic:  Normal speech and language. No gross focal neurologic deficits are appreciated.  Skin:  Skin is warm, dry and intact. Psychiatric: Mood and affect are normal. Speech and behavior are normal.  ____________________________________________   LABS (all labs ordered are listed, but only abnormal results are displayed)  Labs Reviewed - No data to display ____________________________________________   RADIOLOGY I, 12/16/20, personally viewed and evaluated these images (plain radiographs) as part of my medical decision making, as well as reviewing the written report by the radiologist.  ED MD interpretation: Non- displaced left great toe distal phalanx fracture  Official radiology  report(s): DG Toe Great Left  Result Date: 12/14/2020 CLINICAL DATA:  Left great toe pain.  Hit toe on wall. EXAM: LEFT GREAT TOE COMPARISON:  None FINDINGS: There is a fracture at the base of the left great toe distal phalanx. Since nondisplaced. No subluxation or dislocation. IMPRESSION: Nondisplaced fractures through the base of the left great toe distal phalanx. Electronically Signed   By: 12/16/2020 M.D.   On: 12/14/2020 03:58    ____________________________________________   PROCEDURES   Procedure(s) performed (including Critical Care):  Procedures   ____________________________________________   INITIAL IMPRESSION / MDM / ASSESSMENT AND PLAN / ED COURSE  As part of my medical decision making, I reviewed the following data within the electronic MEDICAL RECORD NUMBER Nursing  notes reviewed and incorporated, Radiograph reviewed , Notes from prior ED visits, and [place] Controlled Substance Database   Nondisplaced fracture of the distal phalanx of the left great toe.  Appropriate amount of swelling and ecchymosis.  I buddy taped the toe to the adjacent toe and provided a hard soled shoe.  She has seen Dr. Linus Galas in the past so I provided follow-up information for him that I gave my usual and customary management recommendations and return precautions.          ____________________________________________  FINAL CLINICAL IMPRESSION(S) / ED DIAGNOSES  Final diagnoses:  Nondisplaced fracture of distal phalanx of left great toe, initial encounter for closed fracture     MEDICATIONS GIVEN DURING THIS VISIT:  Medications - No data to display   ED Discharge Orders          Ordered    HYDROcodone-acetaminophen (NORCO/VICODIN) 5-325 MG tablet  Every 6 hours PRN        12/14/20 0612    docusate sodium (COLACE) 100 MG capsule        12/14/20 0612             Note:  This document was prepared using Dragon voice recognition software and may include unintentional  dictation errors.   Loleta Rose, MD 12/14/20 [phone]

## 2020-12-29 DIAGNOSIS — M79671 Pain in right foot: Secondary | ICD-10-CM | POA: Diagnosis not present

## 2020-12-29 DIAGNOSIS — S92424D Nondisplaced fracture of distal phalanx of right great toe, subsequent encounter for fracture with routine healing: Secondary | ICD-10-CM | POA: Diagnosis not present

## 2021-02-15 ENCOUNTER — Other Ambulatory Visit: Payer: Self-pay | Admitting: Internal Medicine

## 2021-02-17 NOTE — Telephone Encounter (Signed)
LAST APPOINTMENT DATE: 09/22/2020   NEXT APPOINTMENT DATE: Visit date not found    LAST REFILL: 08/22/2019  QTY: 18g 3 rf     .

## 2021-08-04 ENCOUNTER — Other Ambulatory Visit: Payer: Self-pay | Admitting: Internal Medicine

## 2021-08-04 NOTE — Telephone Encounter (Signed)
Requested medications are due for refill today.  yes  Requested medications are on the active medications list.  yes  Last refill. 02/17/2021  Future visit scheduled.   no  Notes to clinic.  No PCP listed - Saw Ms. Baity at her old clinic. Last refilled by Justin Mend FNP    Requested Prescriptions  Pending Prescriptions Disp Refills   albuterol (VENTOLIN HFA) 108 (90 Base) MCG/ACT inhaler [Pharmacy Med Name: Albuterol Sulfate HFA 108 (90 Base) MCG/ACT Inhalation Aerosol Solution] 7 each 0    Sig: INHALE 2 PUFFS BY MOUTH EVERY 4 HOURS AS NEEDED FOR WHEEZING AND FOR SHORTNESS OF BREATH     There is no refill protocol information for this order

## 2021-08-17 ENCOUNTER — Other Ambulatory Visit: Payer: Self-pay

## 2021-08-17 ENCOUNTER — Emergency Department
Admission: EM | Admit: 2021-08-17 | Discharge: 2021-08-17 | Disposition: A | Discharge status: Home / Self Care | Payer: BC Managed Care – PPO | Attending: Emergency Medicine | Admitting: Emergency Medicine

## 2021-08-17 DIAGNOSIS — J449 Chronic obstructive pulmonary disease, unspecified: Secondary | ICD-10-CM | POA: Diagnosis not present

## 2021-08-17 DIAGNOSIS — M5459 Other low back pain: Secondary | ICD-10-CM | POA: Diagnosis not present

## 2021-08-17 DIAGNOSIS — M545 Low back pain, unspecified: Secondary | ICD-10-CM | POA: Diagnosis not present

## 2021-08-17 DIAGNOSIS — I10 Essential (primary) hypertension: Secondary | ICD-10-CM | POA: Diagnosis not present

## 2021-08-17 MED ORDER — KETOROLAC TROMETHAMINE 60 MG/2ML IM SOLN
30.0000 mg | Freq: Once | INTRAMUSCULAR | Status: AC
  Administered 2021-08-17: 30 mg via INTRAMUSCULAR
  Filled 2021-08-17: qty 2

## 2021-08-17 MED ORDER — LIDOCAINE 5 % EX PTCH: 1.0000 | MEDICATED_PATCH | CUTANEOUS | 0 refills | Status: DC

## 2021-08-17 MED ORDER — LIDOCAINE 5 % EX PTCH
1.0000 | MEDICATED_PATCH | CUTANEOUS | Status: DC
  Administered 2021-08-17: 1 via TRANSDERMAL
  Filled 2021-08-17: qty 1

## 2021-08-17 MED ORDER — NAPROXEN 500 MG PO TABS: 500.0000 mg | ORAL_TABLET | Freq: Two times a day (BID) | ORAL | 0 refills | Status: DC

## 2021-08-17 MED ORDER — CYCLOBENZAPRINE HCL 5 MG PO TABS: ORAL_TABLET | ORAL | 0 refills | Status: DC

## 2021-08-17 NOTE — ED Triage Notes (Signed)
Pt presents to ER c/o lower back pain that started Monday after bending down to pick up something.  Pt states when she stood up, she felt immediate back pain.  Pt denies any falls or other injuries.  Pt A&O x4 at this time in NAD.

## 2021-08-17 NOTE — ED Provider Notes (Signed)
[facility] Provider Note    Event Date/Time   First MD Initiated Contact with Patient 08/17/21 0530     (approximate)   History   Back Pain   HPI  Allison Brennan is a 56 y.o. female who presents to the ED from home with a chief complaint of lower back pain.  Patient bent over to pick something off the floor and when she straightened up she felt a sharp pain in her lower back.  This was 2 days ago and she has been experiencing pain since that time.  Pain does not radiate to her legs.  Patient denies associated extremity weakness/numbness/tingling, bowel or bladder incontinence.  Denies recent fever, chills, dysuria, nausea or vomiting.     Past Medical History   Past Medical History:  Diagnosis Date   Back pain, lumbosacral    COPD (chronic obstructive pulmonary disease) (HCC)    no pulmonologist   Headache(784.0)    Hypertension    PPD positive, treated 1998     Active Problem List   Patient Active Problem List   Diagnosis Date Noted   Chronic pain of both knees 09/22/2020   Chronic thumb pain, bilateral 09/22/2020   HLD (hyperlipidemia) 09/10/2017   Lumbago 11/13/2011   Essential hypertension 08/29/2011     Past Surgical History   Past Surgical History:  Procedure Laterality Date   CESAREAN SECTION     x2   CLOSED REDUCTION FINGER WITH PERCUTANEOUS PINNING Left 06/17/2015   Procedure: CLOSED REDUCTION FINGER WITH PERCUTANEOUS PINNING little finger;  Surgeon: Kennedy Bucker, MD;  Location: [facility];  Service: Orthopedics;  Laterality: Left;   EXOSTECTECTOMY TOE Right 02/12/2015   Procedure: EXOSTECTECTOMY TOE;  Surgeon: Linus Galas, MD;  Location: [facility];  Service: Podiatry;  Laterality: Right;   KNEE ARTHROSCOPY  03/13/2012   Procedure: ARTHROSCOPY KNEE;  Surgeon: Loanne Drilling, MD;  Location: [facility];  Service: Orthopedics;  Laterality: Right;  debridementt right medial meniscus/chrondroplasty   PARTIAL HYSTERECTOMY  1999   pelvic  pain     Home Medications   Prior to Admission medications   Medication Sig Start Date End Date Taking? Authorizing Provider  cyclobenzaprine (FLEXERIL) 5 MG tablet 1 tablet every 8 hours as needed for muscle spasms 08/17/21  Yes Irean Hong, MD  lidocaine (LIDODERM) 5 % Place 1 patch onto the skin daily. Remove & Discard patch within 12 hours or as directed by MD 08/17/21  Yes Irean Hong, MD  naproxen (NAPROSYN) 500 MG tablet Take 1 tablet (500 mg total) by mouth 2 (two) times daily with a meal. 08/17/21  Yes Irean Hong, MD  ADVAIR DISKUS 100-50 MCG/DOSE AEPB INHALE 1 DOSE BY MOUTH TWICE DAILY 08/03/20   Lorre Munroe, NP  albuterol (VENTOLIN HFA) 108 (90 Base) MCG/ACT inhaler INHALE 2 PUFFS INTO LUNGS EVERY 4 HOURS AS NEEDED FOR WHEEZING OR  SHORTNESS  OF  BREATH 02/17/21   Worthy Rancher B, FNP  docusate sodium (COLACE) 100 MG capsule Take 1 tablet once or twice daily as needed for constipation while taking narcotic pain medicine 12/14/20   Loleta Rose, MD  HYDROcodone-acetaminophen (NORCO/VICODIN) 5-325 MG tablet Take 2 tablets by mouth every 6 (six) hours as needed for moderate pain or severe pain. 12/14/20   Loleta Rose, MD  Omega-3 Fatty Acids (FISH OIL) 1000 MG CAPS Take 1 capsule by mouth daily.    [provider]  pantoprazole (PROTONIX) 40 MG tablet Take 1 tablet (  40 mg total) by mouth daily. 10/04/20 10/04/21  Delton Prairie, MD  predniSONE (DELTASONE) 10 MG tablet Take 3 tabs on days 1-3, take 2 tabs on days 4-6, take 1 tab on days 7-9 09/22/20   Lorre Munroe, NP     Allergies  Lisinopril   Family History   Family History  Problem Relation Age of Onset   Diverticulitis Mother    Deep vein thrombosis Mother    Arthritis Maternal Grandmother    Osteoporosis Maternal Grandmother    Cancer Maternal Aunt        brain   Breast cancer Neg Hx      Physical Exam  Triage Vital Signs: ED Triage Vitals [08/17/21 0256]  Enc Vitals Group     BP (!) 184/101     Pulse  Rate 67     Resp 16     Temp 97.7 F (36.5 C)     Temp Source Oral     SpO2 96 %     Weight 176 lb (79.8 kg)     Height 5\' 3"  (1.6 m)     Head Circumference      Peak Flow      Pain Score 7     Pain Loc      Pain Edu?      Excl. in GC?     Updated Vital Signs: BP (!) 184/101 (BP Location: Left Arm)    Pulse 67    Temp 97.7 F (36.5 C) (Oral)    Resp 16    Ht 5\' 3"  (1.6 m)    Wt 79.8 kg    SpO2 96%    BMI 31.18 kg/m    General: Awake, no distress.  CV:  Good peripheral perfusion.  Resp:  Normal effort.  Abd:  Nontender.  No distention.  No abdominal bruits. Other:  No lumbar spine tenderness to palpation.  Bilateral paraspinal muscle spasms.  Negative straight leg raise.  5/5 motor strength and sensation BLE.   ED Results / Procedures / Treatments  Labs (all labs ordered are listed, but only abnormal results are displayed) Labs Reviewed - No data to display   EKG  None   RADIOLOGY None   Official radiology report(s): No results found.   PROCEDURES:  Critical Care performed: No  Procedures   MEDICATIONS ORDERED IN ED: Medications  ketorolac (TORADOL) injection 30 mg (has no administration in time range)  lidocaine (LIDODERM) 5 % 1 patch (has no administration in time range)     IMPRESSION / MDM / ASSESSMENT AND PLAN / ED COURSE  I reviewed the triage vital signs and the nursing notes.                             56 year old female presenting with low back pain.  No focal neurological deficits on examination.  Drove herself to the ED.  Will administer IM ketorolac and lidocaine patch.  On discharge she will be prescribed Naprosyn, lidocaine patch and Flexeril for muscle spasms.  She will follow-up with orthopedics as needed.  Strict return precautions given.  Patient verbalizes understanding and agrees with plan of care.  FINAL CLINICAL IMPRESSION(S) / ED DIAGNOSES   Final diagnoses:  Acute bilateral low back pain without sciatica     Rx / DC  Orders   ED Discharge Orders          Ordered    naproxen (NAPROSYN) 500 MG tablet  2 times daily with meals        08/17/21 0554    cyclobenzaprine (FLEXERIL) 5 MG tablet        08/17/21 0554    lidocaine (LIDODERM) 5 %  Every 24 hours        08/17/21 0554             Note:  This document was prepared using Dragon voice recognition software and may include unintentional dictation errors.   Irean Hong, MD 08/17/21 [phone]

## 2021-08-17 NOTE — Discharge Instructions (Signed)
1.  You may take Naprosyn as needed for pain. 2.  Take Flexeril as needed for muscle spasms. 3.  Apply Lidocaine patch as directed for pain. 4.  Return to the ER for worsening symptoms, persistent vomiting, difficulty breathing, fever, extremity weakness, bowel or bladder incontinence, or other concerns.

## 2021-10-07 IMAGING — CT CT ABD-PELV W/ CM
2 of 5 series · 16 of 46 positions shown, 18 images · IV contrast (APPLIED)
Comparison: CT abdomen pelvis dated 11/28/2019.

CLINICAL DATA: 54-year-old female with right lower quadrant
abdominal pain.

EXAM:
CT ABDOMEN AND PELVIS WITH CONTRAST
TECHNIQUE: Multidetector CT imaging of the abdomen and pelvis was performed
using the standard protocol following bolus administration of
intravenous contrast.
CONTRAST:  100mL OMNIPAQUE IOHEXOL 300 MG/ML  SOLN

[Series 2: routine abd/pel with · axial · 0.88mm/px · z∈[-1055,-675]mm · 13 of 86 slices shown, 15 images]
[im 5/86  soft-tissue]
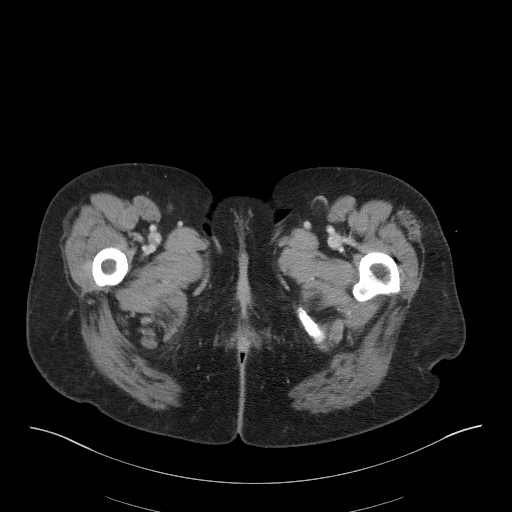
[im 5/86  bone]
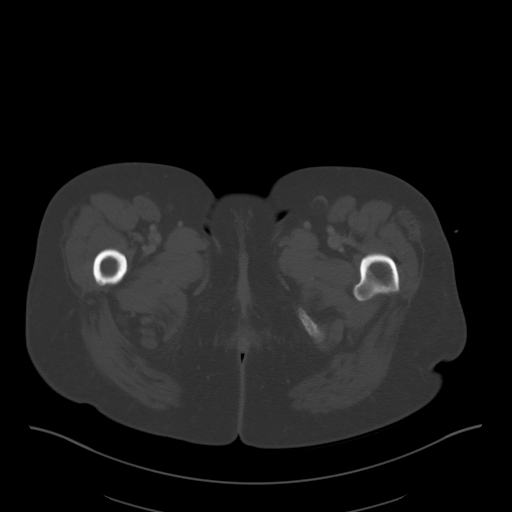
[im 14/86  soft-tissue]
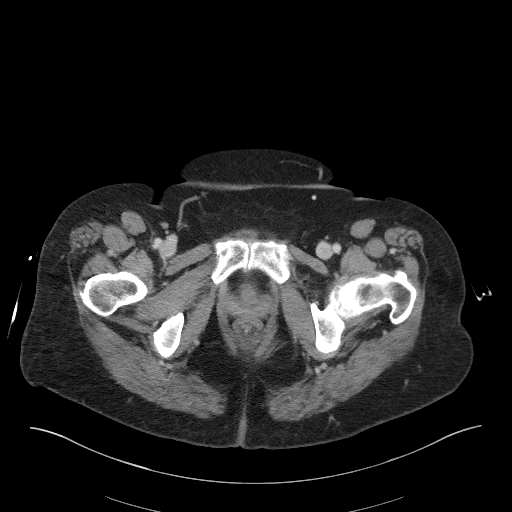
[im 18/86  soft-tissue]
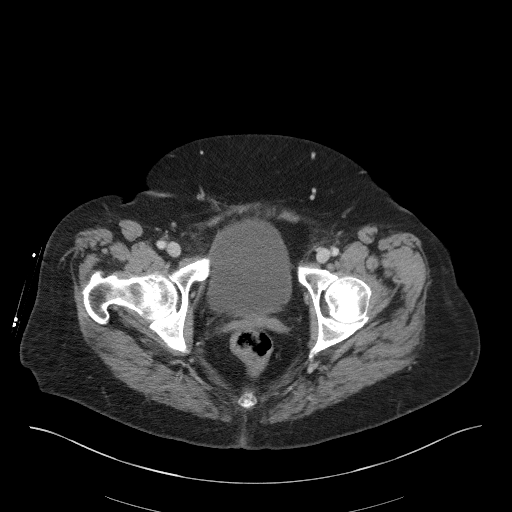
[im 23/86  soft-tissue]
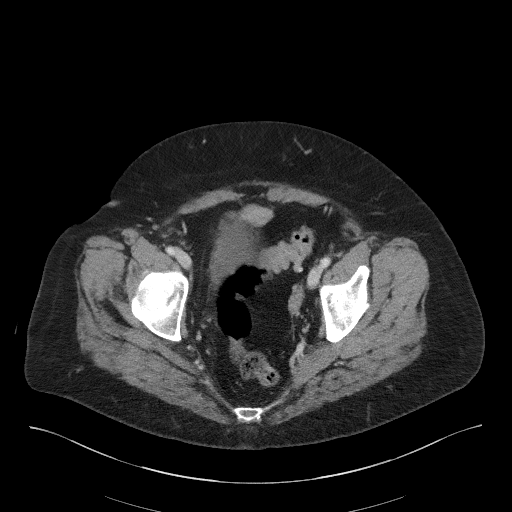
[im 32/86  soft-tissue]
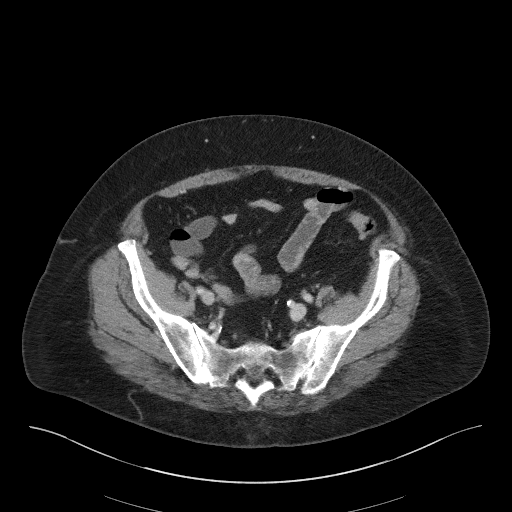
[im 36/86  soft-tissue]
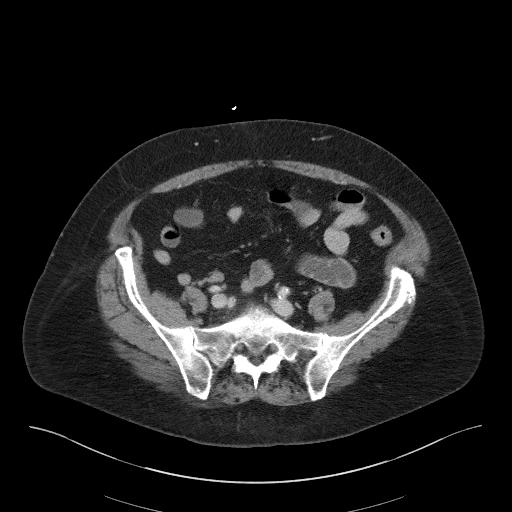
[im 45/86  soft-tissue]
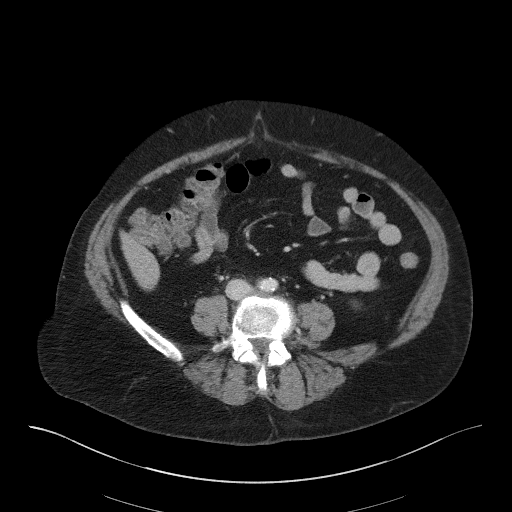
[im 50/86  soft-tissue]
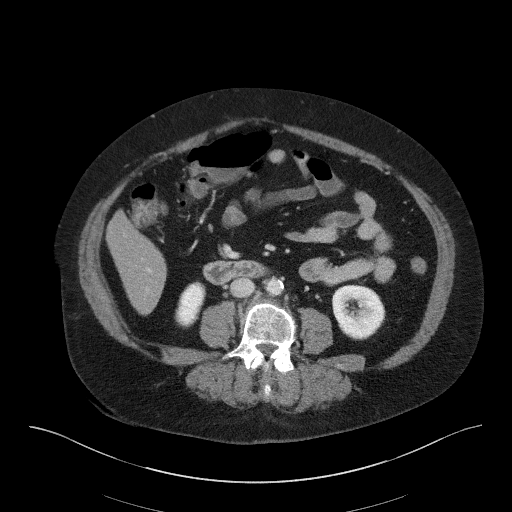
[im 54/86  soft-tissue]
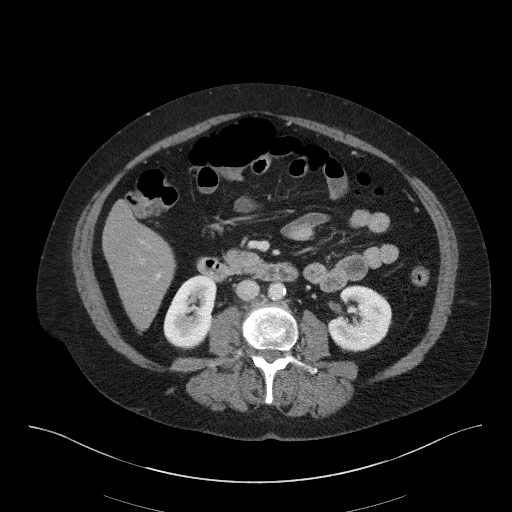
[im 54/86  bone]
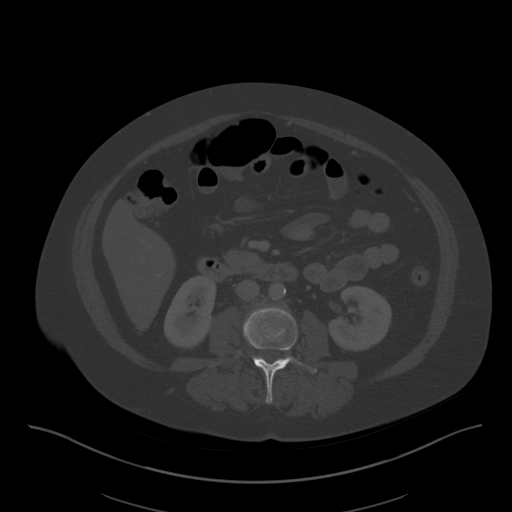
[im 63/86  soft-tissue]
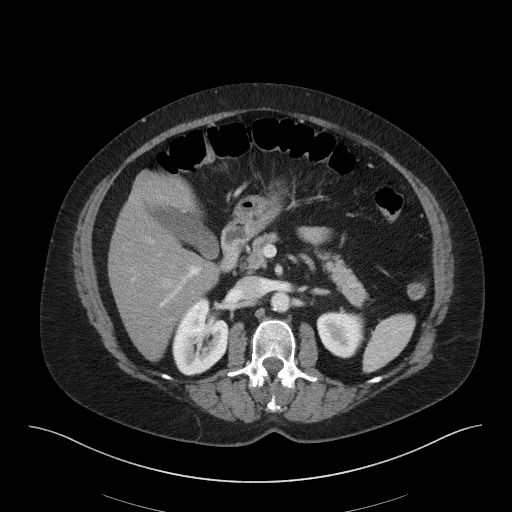
[im 68/86  soft-tissue]
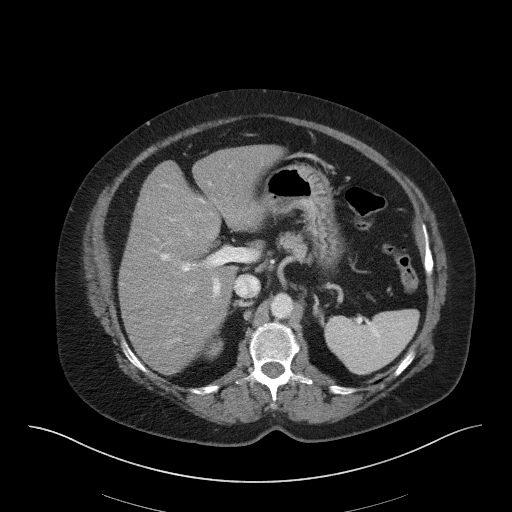
[im 72/86  soft-tissue]
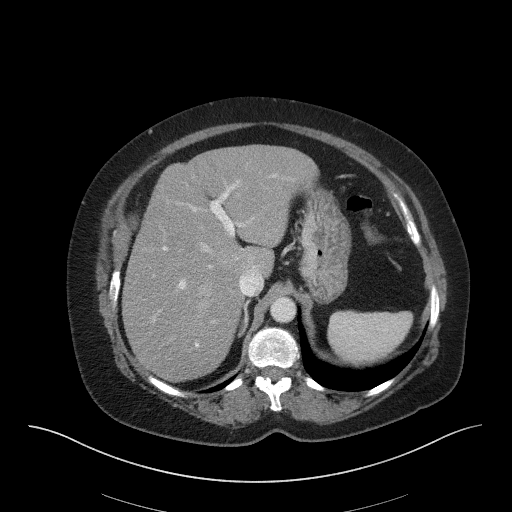
[im 81/86  soft-tissue]
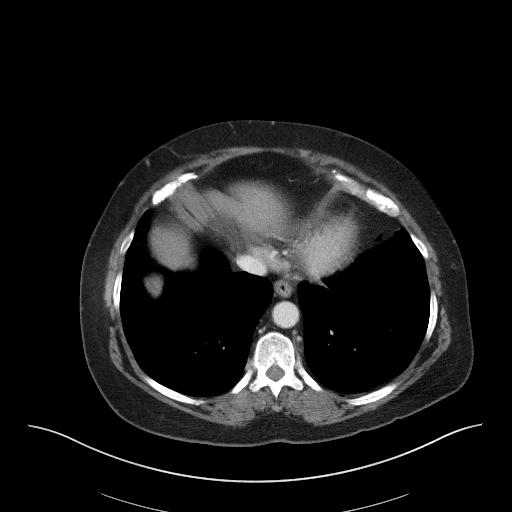

[Series 5: coronal st · coronal · 0.82mm/px · 3 of 109 slices shown]
[im 37/109  soft-tissue]
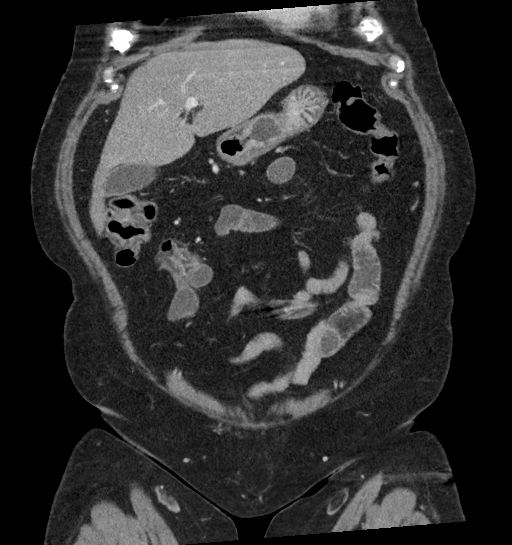
[im 49/109  soft-tissue]
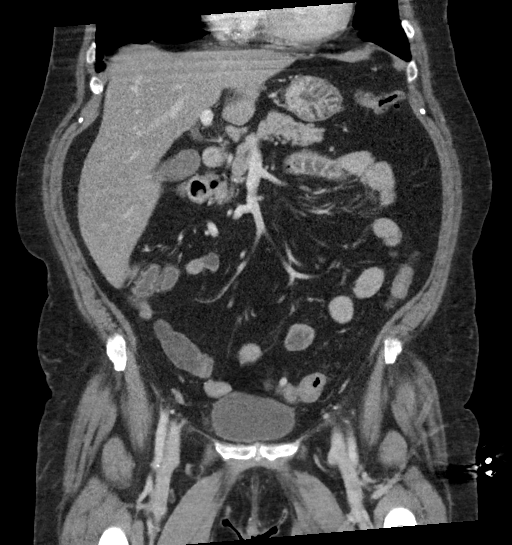
[im 61/109  soft-tissue]
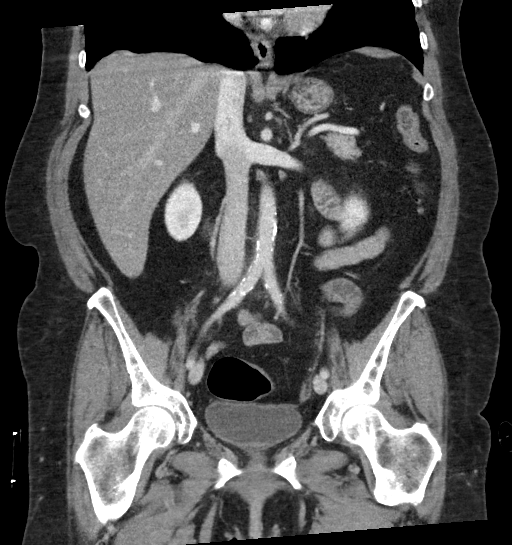

[16 of 46 positions shown; findings below may reference images not displayed]

FINDINGS: Lower chest: The visualized lung bases are clear.

No intra-abdominal free air or free fluid.

Hepatobiliary: Fatty liver. No intrahepatic biliary dilatation. The
gallbladder is unremarkable.

Pancreas: Unremarkable. No pancreatic ductal dilatation or
surrounding inflammatory changes.

Spleen: Normal in size without focal abnormality.

Adrenals/Urinary Tract: The adrenal glands unremarkable. The
kidneys, visualized ureters, and urinary bladder appear
unremarkable.

Stomach/Bowel: There is thickened appearance of the inferior wall of
the gastric antrum with a 1 cm focal area of apparent ulceration
(coronal 41/5 and axial [DATE]) concerning for a gastric ulcer.
Further evaluation with endoscopy is recommended. There is a small
duodenal diverticulum. There is colonic diverticulosis without
active inflammatory changes. There is no bowel obstruction or active
inflammation. The appendix is normal.

Vascular/Lymphatic: Mild aortoiliac atherosclerotic disease. The IVC
is unremarkable. No portal venous gas. There is no adenopathy.

Reproductive: Hysterectomy.  No adnexal masses.

Other: None

Musculoskeletal: Mild degenerative changes of the spine. No acute
osseous pathology.
IMPRESSION: 1. Findings concerning for a distal gastric ulcer. Further
evaluation with endoscopy is recommended.
2. Colonic diverticulosis. No bowel obstruction. Normal appendix.
3. Fatty liver.
4. Aortic Atherosclerosis (HTAEV-C11.1).

## 2021-10-10 ENCOUNTER — Encounter: Payer: Self-pay | Admitting: Internal Medicine

## 2021-10-10 ENCOUNTER — Ambulatory Visit (INDEPENDENT_AMBULATORY_CARE_PROVIDER_SITE_OTHER): Payer: BC Managed Care – PPO | Admitting: Internal Medicine

## 2021-10-10 VITALS — BP 200/108 | HR 80 | Ht 63.0 in | Wt 174.9 lb

## 2021-10-10 DIAGNOSIS — M25561 Pain in right knee: Secondary | ICD-10-CM | POA: Diagnosis not present

## 2021-10-10 DIAGNOSIS — I10 Essential (primary) hypertension: Secondary | ICD-10-CM | POA: Diagnosis not present

## 2021-10-10 DIAGNOSIS — J449 Chronic obstructive pulmonary disease, unspecified: Secondary | ICD-10-CM

## 2021-10-10 DIAGNOSIS — M25562 Pain in left knee: Secondary | ICD-10-CM

## 2021-10-10 DIAGNOSIS — M79645 Pain in left finger(s): Secondary | ICD-10-CM

## 2021-10-10 DIAGNOSIS — Z1211 Encounter for screening for malignant neoplasm of colon: Secondary | ICD-10-CM

## 2021-10-10 DIAGNOSIS — Z124 Encounter for screening for malignant neoplasm of cervix: Secondary | ICD-10-CM

## 2021-10-10 DIAGNOSIS — E782 Mixed hyperlipidemia: Secondary | ICD-10-CM | POA: Diagnosis not present

## 2021-10-10 DIAGNOSIS — J42 Unspecified chronic bronchitis: Secondary | ICD-10-CM

## 2021-10-10 DIAGNOSIS — G8929 Other chronic pain: Secondary | ICD-10-CM

## 2021-10-10 DIAGNOSIS — Z1231 Encounter for screening mammogram for malignant neoplasm of breast: Secondary | ICD-10-CM

## 2021-10-10 DIAGNOSIS — M79644 Pain in right finger(s): Secondary | ICD-10-CM

## 2021-10-10 DIAGNOSIS — M545 Low back pain, unspecified: Secondary | ICD-10-CM | POA: Diagnosis not present

## 2021-10-10 MED ORDER — FLUTICASONE-SALMETEROL 100-50 MCG/ACT IN AEPB: INHALATION_SPRAY | RESPIRATORY_TRACT | 3 refills | Status: DC

## 2021-10-10 MED ORDER — ALBUTEROL SULFATE HFA 108 (90 BASE) MCG/ACT IN AERS: INHALATION_SPRAY | RESPIRATORY_TRACT | 0 refills | Status: DC

## 2021-10-10 MED ORDER — LOSARTAN POTASSIUM-HCTZ 50-12.5 MG PO TABS: 1.0000 | ORAL_TABLET | Freq: Every day | ORAL | 3 refills | Status: DC

## 2021-10-10 NOTE — Assessment & Plan Note (Signed)
Take Tylenol ?2 tablets p.o. 3 times a day ?

## 2021-10-10 NOTE — Assessment & Plan Note (Signed)
-   Patient's back pain is under control with medication.  - Encouraged the patient to stretch or do yoga as able to help with back pain 

## 2021-10-10 NOTE — Assessment & Plan Note (Signed)
Refer to Ortho?

## 2021-10-10 NOTE — Progress Notes (Signed)
? ?New Patient Office Visit ? ?Subjective:  ?Patient ID: Allison Brennan, female    DOB: 01/04/1966  Age: 56 y.o. MRN: [id number] ? ?CC:  ?Chief Complaint  ?Patient presents with  ? New Patient (Initial Visit)  ? ? ?Muscle Pain ?This is a new problem. The current episode started more than 1 month ago. The problem occurs constantly. The pain is medium. The symptoms are aggravated by any movement. Associated symptoms include eye pain, fatigue and headaches. Pertinent negatives include no abdominal pain, chest pain, constipation, visual change or vomiting. She has been Behaving normally.  ?Patient presents for body ache ? ?Past Medical History:  ?Diagnosis Date  ? Back pain, lumbosacral   ? COPD (chronic obstructive pulmonary disease) (HCC)   ? no pulmonologist  ? Headache(784.0)   ? Hypertension   ? PPD positive, treated 1998  ? ? ? ?Current Outpatient Medications:  ?  albuterol (VENTOLIN HFA) 108 (90 Base) MCG/ACT inhaler, INHALE 2 PUFFS INTO LUNGS EVERY 4 HOURS AS NEEDED FOR WHEEZING OR  SHORTNESS  OF  BREATH, Disp: 18 g, Rfl: 0 ?  fluticasone-salmeterol (ADVAIR DISKUS) 100-50 MCG/ACT AEPB, INHALE 1 DOSE BY MOUTH TWICE DAILY, Disp: 60 each, Rfl: 3  ? ?Past Surgical History:  ?Procedure Laterality Date  ? CESAREAN SECTION    ? x2  ? CLOSED REDUCTION FINGER WITH PERCUTANEOUS PINNING Left 06/17/2015  ? Procedure: CLOSED REDUCTION FINGER WITH PERCUTANEOUS PINNING little finger;  Surgeon: Kennedy BuckerMichael Menz, MD;  Location: [facility];  Service: Orthopedics;  Laterality: Left;  ? EXOSTECTECTOMY TOE Right 02/12/2015  ? Procedure: EXOSTECTECTOMY TOE;  Surgeon: Linus Galasodd Cline, MD;  Location: [facility];  Service: Podiatry;  Laterality: Right;  ? KNEE ARTHROSCOPY  03/13/2012  ? Procedure: ARTHROSCOPY KNEE;  Surgeon: Loanne DrillingFrank V Aluisio, MD;  Location: [facility];  Service: Orthopedics;  Laterality: Right;  debridementt right medial meniscus/chrondroplasty  ? PARTIAL HYSTERECTOMY  1999  ? pelvic pain  ? ? ?Family History  ?Problem Relation Age of  Onset  ? Diverticulitis Mother   ? Deep vein thrombosis Mother   ? Arthritis Maternal Grandmother   ? Osteoporosis Maternal Grandmother   ? Cancer Maternal Aunt   ?     brain  ? Breast cancer Neg Hx   ? ? ?Social History  ? ?Socioeconomic History  ? Marital status: Widowed  ?  Spouse name: Not on file  ? Number of children: 2  ? Years of education: Not on file  ? Highest education level: Not on file  ?Occupational History  ? 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  Employer: [employer]  ?Tobacco Use  ? Smoking status: Every Day  ?  Packs/day: 1.00  ?  Years: 28.00  ?  Pack years: 28.00  ?  Types: Cigarettes  ? Smokeless tobacco: Never  ? Tobacco comments:  ?  Decline Cessation information/SLS  ?Vaping Use  ? Vaping Use: Never used  ?Substance and Sexual Activity  ? Alcohol use: Yes  ?  Alcohol/week: 2.0 standard drinks  ?  Types: 2 Cans of beer per week  ?  Comment: 1-2 drinks beer every day  ? Drug use: No  ? Sexual activity: Not Currently  ?Other Topics Concern  ? Not on file  ?Social History Narrative  ? Lives alone in [place], no pets. Both children living with her.   ?   ? 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   ?   ? Regular Exercise -  NO  ? Daily Caffeine Use:  6 - 8 sodas  ? Diet-  ? ?Social Determinants of Health  ? ?Financial Resource Strain: Not on file  ?Food Insecurity: Not on file  ?Transportation Needs: Not on file  ?Physical Activity: Not on file  ?Stress: Not on file  ?Social Connections: Not on file  ?Intimate Partner Violence: Not on file  ? ? ?ROS ?Review of Systems  ?Constitutional:  Positive for fatigue.  ?HENT:  Negative for ear discharge.   ?Eyes:  Positive for pain.  ?Respiratory:  Negative for cough and choking.   ?Cardiovascular:  Negative for chest pain.  ?Gastrointestinal:  Negative for abdominal pain, constipation and vomiting.  ?Genitourinary:  Negative for flank pain.  ?Neurological:  Positive for headaches. Negative for dizziness and syncope.  ?Psychiatric/Behavioral:  Negative for agitation and  behavioral problems.   ? ?Objective:  ? ?Today's Vitals: BP (!) 200/108   Pulse 80   Ht 5\' 3"  (1.6 m)   Wt 174 lb 14.4 oz (79.3 kg)   BMI 30.98 kg/m?  ? ?Physical Exam ?Constitutional:   ?   Appearance: Normal appearance.  ?HENT:  ?   Head: Normocephalic.  ?   Nose: Nose normal.  ?Cardiovascular:  ?   Rate and Rhythm: Normal rate.  ?Abdominal:  ?   General: Abdomen is flat.  ?Musculoskeletal:     ?   General: Normal range of motion.  ?Skin: ?   General: Skin is warm.  ?Neurological:  ?   General: No focal deficit present.  ?   Mental Status: She is alert.  ? ? ?Assessment & Plan:  ? ?Problem List Items Addressed This Visit   ? ?  ? Cardiovascular and Mediastinum  ? Essential hypertension  ?   Patient denies any chest pain or shortness of breath there is no history of palpitation or paroxysmal nocturnal dyspnea ?  patient was advised to follow low-salt low-cholesterol diet ? ?  ideally I want to keep systolic blood pressure below mmHg, patient was asked to check blood pressure one times a week and give me a report on that.  Patient will be follow-up in 3 months  or earlier as needed, patient will call me back for any change in the cardiovascular symptoms ?Patient was advised to buy a book from local bookstore concerning blood pressure and read several chapters  every day.  This will be supplemented by some of the material we will give him from the office.  Patient should also utilize other resources like YouTube and Internet to learn more about the blood pressure and the diet. ?  ?  ?  ? Other  ? Lumbago  ?  - Patient's back pain is under control with medication.  ?- Encouraged the patient to stretch or do yoga as able to help with back pain ?  ?  ? Colon cancer screening - Primary  ?  Scheduled for colonoscopy ?  ?  ? Relevant Orders  ? Ambulatory referral to Gastroenterology  ? HLD (hyperlipidemia)  ? Chronic pain of both knees  ?  Take Tylenol ?2 tablets p.o. 3 times a day ?  ?  ? Chronic thumb pain,  bilateral  ?  Refer to Ortho ?  ?  ? ?Other Visit Diagnoses   ? ? Cervical cancer screening      ? Relevant Orders  ? Ambulatory referral to Obstetrics / Gynecology  ? Chronic bronchitis, unspecified chronic bronchitis type (HCC)      ? Relevant Medications  ? albuterol (  VENTOLIN HFA) 108 (90 Base) MCG/ACT inhaler  ? fluticasone-salmeterol (ADVAIR DISKUS) 100-50 MCG/ACT AEPB  ? Primary hypertension      ? ?  ? ? ?Outpatient Encounter Medications as of 10/10/2021  ?Medication Sig  ? [DISCONTINUED] ADVAIR DISKUS 100-50 MCG/DOSE AEPB INHALE 1 DOSE BY MOUTH TWICE DAILY  ? [DISCONTINUED] albuterol (VENTOLIN HFA) 108 (90 Base) MCG/ACT inhaler INHALE 2 PUFFS INTO LUNGS EVERY 4 HOURS AS NEEDED FOR WHEEZING OR  SHORTNESS  OF  BREATH  ? albuterol (VENTOLIN HFA) 108 (90 Base) MCG/ACT inhaler INHALE 2 PUFFS INTO LUNGS EVERY 4 HOURS AS NEEDED FOR WHEEZING OR  SHORTNESS  OF  BREATH  ? fluticasone-salmeterol (ADVAIR DISKUS) 100-50 MCG/ACT AEPB INHALE 1 DOSE BY MOUTH TWICE DAILY  ? [DISCONTINUED] cyclobenzaprine (FLEXERIL) 5 MG tablet 1 tablet every 8 hours as needed for muscle spasms  ? [DISCONTINUED] docusate sodium (COLACE) 100 MG capsule Take 1 tablet once or twice daily as needed for constipation while taking narcotic pain medicine  ? [DISCONTINUED] HYDROcodone-acetaminophen (NORCO/VICODIN) 5-325 MG tablet Take 2 tablets by mouth every 6 (six) hours as needed for moderate pain or severe pain.  ? [DISCONTINUED] lidocaine (LIDODERM) 5 % Place 1 patch onto the skin daily. Remove & Discard patch within 12 hours or as directed by MD  ? [DISCONTINUED] naproxen (NAPROSYN) 500 MG tablet Take 1 tablet (500 mg total) by mouth 2 (two) times daily with a meal.  ? [DISCONTINUED] Omega-3 Fatty Acids (FISH OIL) 1000 MG CAPS Take 1 capsule by mouth daily.  ? [DISCONTINUED] pantoprazole (PROTONIX) 40 MG tablet Take 1 tablet (40 mg total) by mouth daily.  ? [DISCONTINUED] predniSONE (DELTASONE) 10 MG tablet Take 3 tabs on days 1-3, take 2 tabs  on days 4-6, take 1 tab on days 7-9  ? ?No facility-administered encounter medications on file as of 10/10/2021.  ? ? ?Follow-up: No follow-ups on file.  ? ?Corky Downs, MD ?

## 2021-10-10 NOTE — Assessment & Plan Note (Signed)

## 2021-10-10 NOTE — Assessment & Plan Note (Signed)
Scheduled for colonoscopy. 

## 2021-10-11 ENCOUNTER — Other Ambulatory Visit: Payer: Self-pay | Admitting: Internal Medicine

## 2021-10-11 ENCOUNTER — Encounter (INDEPENDENT_AMBULATORY_CARE_PROVIDER_SITE_OTHER): Payer: BC Managed Care – PPO | Admitting: *Deleted

## 2021-10-11 ENCOUNTER — Telehealth: Payer: Self-pay

## 2021-10-11 DIAGNOSIS — E782 Mixed hyperlipidemia: Secondary | ICD-10-CM

## 2021-10-11 DIAGNOSIS — Z7689 Persons encountering health services in other specified circumstances: Secondary | ICD-10-CM | POA: Diagnosis not present

## 2021-10-11 DIAGNOSIS — I10 Essential (primary) hypertension: Secondary | ICD-10-CM | POA: Diagnosis not present

## 2021-10-11 DIAGNOSIS — J42 Unspecified chronic bronchitis: Secondary | ICD-10-CM

## 2021-10-11 MED ORDER — ALBUTEROL SULFATE HFA 108 (90 BASE) MCG/ACT IN AERS: INHALATION_SPRAY | RESPIRATORY_TRACT | 0 refills | Status: DC

## 2021-10-11 NOTE — Telephone Encounter (Signed)
CALLED PATIENT NO ANSWER LEFT VOICEMAIL FOR A CALL BACK ? ?

## 2021-10-12 ENCOUNTER — Encounter: Payer: Self-pay | Admitting: Obstetrics and Gynecology

## 2021-10-12 ENCOUNTER — Telehealth: Payer: Self-pay

## 2021-10-12 LAB — CBC WITH DIFFERENTIAL/PLATELET
Absolute Monocytes: 460 cells/uL (ref 200–950)
Basophils Absolute: 88 cells/uL (ref 0–200)
Basophils Relative: 1.2 %
Eosinophils Absolute: 307 cells/uL (ref 15–500)
Eosinophils Relative: 4.2 %
HCT: 50 % — ABNORMAL HIGH (ref 35.0–45.0)
Hemoglobin: 16.6 g/dL — ABNORMAL HIGH (ref 11.7–15.5)
Lymphs Abs: 2300 cells/uL (ref 850–3900)
MCH: 30.6 pg (ref 27.0–33.0)
MCHC: 33.2 g/dL (ref 32.0–36.0)
MCV: 92.1 fL (ref 80.0–100.0)
MPV: 10.6 fL (ref 7.5–12.5)
Monocytes Relative: 6.3 %
Neutro Abs: 4146 cells/uL (ref 1500–7800)
Neutrophils Relative %: 56.8 %
Platelets: 275 10*3/uL (ref 140–400)
RBC: 5.43 10*6/uL — ABNORMAL HIGH (ref 3.80–5.10)
RDW: 12.5 % (ref 11.0–15.0)
Total Lymphocyte: 31.5 %
WBC: 7.3 10*3/uL (ref 3.8–10.8)

## 2021-10-12 LAB — COMPLETE METABOLIC PANEL WITH GFR
AG Ratio: 1.3 (calc) (ref 1.0–2.5)
ALT: 10 U/L (ref 6–29)
AST: 15 U/L (ref 10–35)
Albumin: 4.3 g/dL (ref 3.6–5.1)
Alkaline phosphatase (APISO): 109 U/L (ref 37–153)
BUN: 14 mg/dL (ref 7–25)
CO2: 21 mmol/L (ref 20–32)
Calcium: 10.2 mg/dL (ref 8.6–10.4)
Chloride: 105 mmol/L (ref 98–110)
Creat: 0.83 mg/dL (ref 0.50–1.03)
Globulin: 3.4 g/dL (calc) (ref 1.9–3.7)
Glucose, Bld: 76 mg/dL (ref 65–99)
Potassium: 4.8 mmol/L (ref 3.5–5.3)
Sodium: 145 mmol/L (ref 135–146)
Total Bilirubin: 0.3 mg/dL (ref 0.2–1.2)
Total Protein: 7.7 g/dL (ref 6.1–8.1)
eGFR: 82 mL/min/{1.73_m2} (ref >=60)

## 2021-10-12 LAB — LIPID PANEL
Cholesterol: 263 mg/dL — ABNORMAL HIGH (ref <200)
HDL: 44 mg/dL — ABNORMAL LOW (ref >=50)
Non-HDL Cholesterol (Calc): 219 mg/dL (calc) — ABNORMAL HIGH (ref <130)
Total CHOL/HDL Ratio: 6 (calc) — ABNORMAL HIGH (ref <5.0)
Triglycerides: 487 mg/dL — ABNORMAL HIGH (ref <150)

## 2021-10-12 LAB — TSH: TSH: 1.12 mIU/L (ref 0.40–4.50)

## 2021-10-12 NOTE — Telephone Encounter (Signed)
CALLED PATIENT NO ANSWER LEFT VOICEMAIL FOR A CALL BACK °Letter sent °

## 2021-10-13 NOTE — Addendum Note (Signed)
Addended by: Jobie Quaker on: 10/13/2021 09:18 AM ? ? Modules accepted: Orders ? ?

## 2021-10-28 ENCOUNTER — Ambulatory Visit (INDEPENDENT_AMBULATORY_CARE_PROVIDER_SITE_OTHER): Payer: BC Managed Care – PPO | Admitting: Nurse Practitioner

## 2021-10-28 ENCOUNTER — Encounter: Payer: Self-pay | Admitting: Nurse Practitioner

## 2021-10-28 VITALS — BP 164/87 | HR 64 | Ht 63.0 in | Wt 179.1 lb

## 2021-10-28 DIAGNOSIS — M25511 Pain in right shoulder: Secondary | ICD-10-CM | POA: Diagnosis not present

## 2021-10-28 DIAGNOSIS — I10 Essential (primary) hypertension: Secondary | ICD-10-CM

## 2021-10-28 DIAGNOSIS — E782 Mixed hyperlipidemia: Secondary | ICD-10-CM

## 2021-10-28 MED ORDER — DICLOFENAC SODIUM 1 % EX GEL: 2.0000 g | Freq: Four times a day (QID) | CUTANEOUS | 1 refills | Status: DC

## 2021-10-28 MED ORDER — MELOXICAM 7.5 MG PO TABS: 7.5000 mg | ORAL_TABLET | Freq: Every day | ORAL | 0 refills | Status: DC

## 2021-10-28 MED ORDER — PRAVASTATIN SODIUM 40 MG PO TABS: 40.0000 mg | ORAL_TABLET | Freq: Every day | ORAL | 1 refills | Status: DC

## 2021-10-28 NOTE — Assessment & Plan Note (Signed)
Elevated total cholesterol, LDL and trigs. ?Started patient on pravastatin 40 mg. ?Gave sample of vasecpa.  ?Encouraged patient to consume low fat and heart healthy diet.  ?

## 2021-10-28 NOTE — Progress Notes (Signed)
? ?Established Patient Office Visit ? ?Subjective:  ?Patient ID: Allison Brennan, female    DOB: Nov 16, 1965  Age: 56 y.o. MRN: [id number] ? ?CC:  ?Chief Complaint  ?Patient presents with  ? Lab results  ? ? ? ?HPI ? ?Allison Brennan presents for lab review and shoulder pain. Patient stated she is not taking any medication. Her medications are very expensive and she is not able to buy it. Her inhaler cost her 100 $  ? ?Shoulder Pain  ?The pain is present in the right shoulder. This is a new problem. The current episode started 1 to 4 weeks ago. There has been no history of extremity trauma. The problem occurs constantly. The problem has been unchanged. The quality of the pain is described as aching. The pain is at a severity of 8/10. Associated symptoms include an inability to bear weight. She has tried heat, cold and NSAIDS for the symptoms. The treatment provided mild relief.   ? ?Past Medical History:  ?Diagnosis Date  ? Back pain, lumbosacral   ? COPD (chronic obstructive pulmonary disease) (HCC)   ? no pulmonologist  ? Headache(784.0)   ? Hypertension   ? PPD positive, treated 1998  ? ? ?Past Surgical History:  ?Procedure Laterality Date  ? CESAREAN SECTION    ? x2  ? CLOSED REDUCTION FINGER WITH PERCUTANEOUS PINNING Left 06/17/2015  ? Procedure: CLOSED REDUCTION FINGER WITH PERCUTANEOUS PINNING little finger;  Surgeon: Hessie Knows, MD;  Location: [facility];  Service: Orthopedics;  Laterality: Left;  ? EXOSTECTECTOMY TOE Right 02/12/2015  ? Procedure: EXOSTECTECTOMY TOE;  Surgeon: Sharlotte Alamo, MD;  Location: [facility];  Service: Podiatry;  Laterality: Right;  ? KNEE ARTHROSCOPY  03/13/2012  ? Procedure: ARTHROSCOPY KNEE;  Surgeon: Gearlean Alf, MD;  Location: [facility];  Service: Orthopedics;  Laterality: Right;  debridementt right medial meniscus/chrondroplasty  ? PARTIAL HYSTERECTOMY  1999  ? pelvic pain  ? ? ?Family History  ?Problem Relation Age of Onset  ? Diverticulitis Mother   ? Deep vein thrombosis Mother    ? Arthritis Maternal Grandmother   ? Osteoporosis Maternal Grandmother   ? Cancer Maternal Aunt   ?     brain  ? Breast cancer Neg Hx   ? ? ?Social History  ? ?Socioeconomic History  ? Marital status: Widowed  ?  Spouse name: Not on file  ? Number of children: 2  ? Years of education: Not on file  ? Highest education level: Not on file  ?Occupational History  ? 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  Employer: [employer]  ?Tobacco Use  ? Smoking status: Every Day  ?  Packs/day: 1.00  ?  Years: 28.00  ?  Pack years: 28.00  ?  Types: Cigarettes  ? Smokeless tobacco: Never  ? Tobacco comments:  ?  Decline Cessation information/SLS  ?Vaping Use  ? Vaping Use: Never used  ?Substance and Sexual Activity  ? Alcohol use: Yes  ?  Alcohol/week: 2.0 standard drinks  ?  Types: 2 Cans of beer per week  ?  Comment: 1-2 drinks beer every day  ? Drug use: No  ? Sexual activity: Not Currently  ?Other Topics Concern  ? Not on file  ?Social History Narrative  ? Lives alone in [place], no pets. Both children living with her.   ?   ? Works in Audiological scientist.   ?   ? Regular Exercise -  NO  ? Daily Caffeine Use:  6 - 8 sodas  ? Diet-  ? ?Social Determinants of Health  ? ?Financial Resource Strain: Not on file  ?Food Insecurity: Not on file  ?Transportation Needs: Not on file  ?Physical Activity: Not on file  ?Stress: Not on file  ?Social Connections: Not on file  ?Intimate Partner Violence: Not on file  ? ? ? ?Outpatient Medications Prior to Visit  ?Medication Sig Dispense Refill  ? albuterol (VENTOLIN HFA) 108 (90 Base) MCG/ACT inhaler INHALE 2 PUFFS INTO LUNGS EVERY 4 HOURS AS NEEDED FOR WHEEZING OR  SHORTNESS  OF  BREATH 18 g 0  ? fluticasone-salmeterol (ADVAIR DISKUS) 100-50 MCG/ACT AEPB INHALE 1 DOSE BY MOUTH TWICE DAILY 60 each 3  ? losartan-hydrochlorothiazide (HYZAAR) 50-12.5 MG tablet Take 1 tablet by mouth daily. 90 tablet 3  ? ?No facility-administered medications prior to visit.  ? ? ?Allergies  ?Allergen Reactions  ?  Lisinopril Cough  ? ? ?ROS ?Review of Systems  ?Constitutional:  Negative for activity change and appetite change.  ?HENT:  Negative for congestion, facial swelling and sinus pain.   ?Eyes:  Negative for discharge and redness.  ?Respiratory:  Negative for apnea and shortness of breath.   ?Cardiovascular:  Negative for chest pain and palpitations.  ?Gastrointestinal:  Negative for abdominal distention and constipation.  ?Endocrine: Negative for cold intolerance.  ?Genitourinary:  Negative for difficulty urinating, dyspareunia and urgency.  ?Musculoskeletal:   ?     Right shoulder pain.  ?Skin:  Negative for color change and rash.  ?Neurological:  Negative for dizziness, facial asymmetry and headaches.  ?Psychiatric/Behavioral:  Negative for agitation, behavioral problems, confusion and decreased concentration.   ? ?  ?Objective:  ?  ?Physical Exam ?Constitutional:   ?   Appearance: Normal appearance. She is normal weight.  ?HENT:  ?   Head: Normocephalic and atraumatic.  ?   Right Ear: Tympanic membrane normal.  ?   Left Ear: Tympanic membrane normal.  ?   Nose: Nose normal.  ?   Mouth/Throat:  ?   Mouth: Mucous membranes are moist.  ?   Pharynx: Oropharynx is clear.  ?Eyes:  ?   Extraocular Movements: Extraocular movements intact.  ?   Conjunctiva/sclera: Conjunctivae normal.  ?   Pupils: Pupils are equal, round, and reactive to light.  ?Cardiovascular:  ?   Rate and Rhythm: Normal rate and regular rhythm.  ?   Pulses: Normal pulses.  ?   Heart sounds: Normal heart sounds.  ?Pulmonary:  ?   Effort: Pulmonary effort is normal.  ?   Breath sounds: Normal breath sounds.  ?Abdominal:  ?   General: Bowel sounds are normal.  ?   Palpations: Abdomen is soft.  ?Musculoskeletal:     ?   General: Tenderness present.  ?   Cervical back: Normal range of motion and neck supple.  ?   Comments: Limited range of motion of right hand, pain while lifting the hand.  ?Skin: ?   General: Skin is warm.  ?   Capillary Refill: Capillary  refill takes less than 2 seconds.  ?Neurological:  ?   General: No focal deficit present.  ?   Mental Status: She is alert and oriented to person, place, and time. Mental status is at baseline.  ?Psychiatric:     ?   Mood and Affect: Mood normal.     ?   Behavior: Behavior normal.     ?   Thought Content: Thought content normal.     ?  Judgment: Judgment normal.  ? ? ?BP (!) 164/87   Pulse 64   Ht 5' 3"  (1.6 m)   Wt 179 lb 1.6 oz (81.2 kg)   BMI 31.73 kg/m?  ?Wt Readings from Last 3 Encounters:  ?10/28/21 179 lb 1.6 oz (81.2 kg)  ?10/10/21 174 lb 14.4 oz (79.3 kg)  ?08/17/21 176 lb (79.8 kg)  ? ? ? ?Health Maintenance Due  ?Topic Date Due  ? COVID-19 Vaccine (1) Never done  ? COLONOSCOPY (Pts 45-80yr Insurance coverage will need to be confirmed)  09/12/2012  ? Zoster Vaccines- Shingrix (1 of 2) Never done  ? MAMMOGRAM  05/02/2018  ? PAP SMEAR-Modifier  04/04/2019  ? ? ?There are no preventive care reminders to display for this patient. ? ?Lab Results  ?Component Value Date  ? TSH 1.12 10/11/2021  ? ?Lab Results  ?Component Value Date  ? WBC 7.3 10/11/2021  ? HGB 16.6 (H) 10/11/2021  ? HCT 50.0 (H) 10/11/2021  ? MCV 92.1 10/11/2021  ? PLT 275 10/11/2021  ? ?Lab Results  ?Component Value Date  ? NA 145 10/11/2021  ? K 4.8 10/11/2021  ? CO2 21 10/11/2021  ? GLUCOSE 76 10/11/2021  ? BUN 14 10/11/2021  ? CREATININE 0.83 10/11/2021  ? BILITOT 0.3 10/11/2021  ? ALKPHOS 86 11/27/2019  ? AST 15 10/11/2021  ? ALT 10 10/11/2021  ? PROT 7.7 10/11/2021  ? ALBUMIN 4.4 11/27/2019  ? CALCIUM 10.2 10/11/2021  ? ANIONGAP 10 05/12/2020  ? EGFR 82 10/11/2021  ? GFR 81.97 09/10/2017  ? ?Lab Results  ?Component Value Date  ? CHOL 263 (H) 10/11/2021  ? ?Lab Results  ?Component Value Date  ? HDL 44 (L) 10/11/2021  ? ?Lab Results  ?Component Value Date  ? LOvid 10/11/2021  ?   Comment:  ?   . ?LDL cholesterol not calculated. Triglyceride levels ?greater than 400 mg/dL invalidate calculated LDL results. ?. ?Reference range:  <100 ?.Marland Kitchen?Desirable range <100 mg/dL for primary prevention;   ?<70 mg/dL for patients with CHD or diabetic patients  ?with > or = 2 CHD risk factors. ?. ?LDL-C is now calculated using the Martin-Hopkins  ?calculation, wh

## 2021-10-28 NOTE — Assessment & Plan Note (Signed)
BP 164/87 ?Patient is not taking any medication at present. ?Encouraged pt take the mediation. ?Consume low fat diet ? ?

## 2021-10-28 NOTE — Assessment & Plan Note (Signed)
Encouraged patient to alternate hot and cold pack. ?Started on meloxicam and volatern gel. ?Patient denied physical therapy. ?

## 2021-11-25 ENCOUNTER — Ambulatory Visit: Payer: BC Managed Care – PPO | Admitting: Nurse Practitioner

## 2022-02-06 ENCOUNTER — Encounter: Payer: Self-pay | Admitting: *Deleted

## 2022-02-06 ENCOUNTER — Other Ambulatory Visit: Payer: Self-pay

## 2022-02-06 DIAGNOSIS — R0981 Nasal congestion: Secondary | ICD-10-CM | POA: Diagnosis not present

## 2022-02-06 DIAGNOSIS — I10 Essential (primary) hypertension: Secondary | ICD-10-CM | POA: Diagnosis not present

## 2022-02-06 DIAGNOSIS — H6992 Unspecified Eustachian tube disorder, left ear: Secondary | ICD-10-CM | POA: Diagnosis not present

## 2022-02-06 DIAGNOSIS — Z7951 Long term (current) use of inhaled steroids: Secondary | ICD-10-CM | POA: Diagnosis not present

## 2022-02-06 DIAGNOSIS — R059 Cough, unspecified: Secondary | ICD-10-CM | POA: Diagnosis not present

## 2022-02-06 DIAGNOSIS — J9811 Atelectasis: Secondary | ICD-10-CM | POA: Diagnosis not present

## 2022-02-06 DIAGNOSIS — R110 Nausea: Secondary | ICD-10-CM | POA: Diagnosis not present

## 2022-02-06 DIAGNOSIS — Z20822 Contact with and (suspected) exposure to covid-19: Secondary | ICD-10-CM | POA: Insufficient documentation

## 2022-02-06 DIAGNOSIS — J449 Chronic obstructive pulmonary disease, unspecified: Secondary | ICD-10-CM | POA: Insufficient documentation

## 2022-02-06 DIAGNOSIS — J019 Acute sinusitis, unspecified: Secondary | ICD-10-CM | POA: Insufficient documentation

## 2022-02-06 NOTE — ED Triage Notes (Signed)
Pt reports a headache, sinus congestion for 3 days.  Pt taking BC's without relief.   Pt also has nausea.    Pt alert  speech clear.  Cig smoker.

## 2022-02-07 ENCOUNTER — Emergency Department
Admission: EM | Admit: 2022-02-07 | Discharge: 2022-02-07 | Disposition: A | Discharge status: Home / Self Care | Payer: BC Managed Care – PPO | Attending: Emergency Medicine | Admitting: Emergency Medicine

## 2022-02-07 ENCOUNTER — Emergency Department: Payer: BC Managed Care – PPO

## 2022-02-07 ENCOUNTER — Telehealth: Payer: Self-pay | Admitting: Emergency Medicine

## 2022-02-07 DIAGNOSIS — H6982 Other specified disorders of Eustachian tube, left ear: Secondary | ICD-10-CM

## 2022-02-07 DIAGNOSIS — R051 Acute cough: Secondary | ICD-10-CM

## 2022-02-07 DIAGNOSIS — R059 Cough, unspecified: Secondary | ICD-10-CM | POA: Diagnosis not present

## 2022-02-07 DIAGNOSIS — R110 Nausea: Secondary | ICD-10-CM | POA: Diagnosis not present

## 2022-02-07 DIAGNOSIS — J9811 Atelectasis: Secondary | ICD-10-CM | POA: Diagnosis not present

## 2022-02-07 DIAGNOSIS — J019 Acute sinusitis, unspecified: Secondary | ICD-10-CM

## 2022-02-07 LAB — SARS CORONAVIRUS 2 BY RT PCR: SARS Coronavirus 2 by RT PCR: NEGATIVE

## 2022-02-07 MED ORDER — PREDNISONE 20 MG PO TABS
60.0000 mg | ORAL_TABLET | Freq: Once | ORAL | Status: AC
  Administered 2022-02-07: 60 mg via ORAL
  Filled 2022-02-07: qty 3

## 2022-02-07 MED ORDER — AMOXICILLIN-POT CLAVULANATE 875-125 MG PO TABS: 1.0000 | ORAL_TABLET | Freq: Two times a day (BID) | ORAL | 0 refills | Status: DC

## 2022-02-07 MED ORDER — HYDROCODONE BIT-HOMATROP MBR 5-1.5 MG/5ML PO SOLN
5.0000 mL | Freq: Four times a day (QID) | ORAL | 0 refills | Status: DC | PRN
Start: 2022-02-07 — End: 2022-02-07

## 2022-02-07 MED ORDER — HYDROCODONE BIT-HOMATROP MBR 5-1.5 MG/5ML PO SOLN
5.0000 mL | Freq: Four times a day (QID) | ORAL | 0 refills | Status: DC | PRN
Start: 2022-02-07 — End: 2022-06-11

## 2022-02-07 MED ORDER — KETOROLAC TROMETHAMINE 60 MG/2ML IM SOLN
30.0000 mg | Freq: Once | INTRAMUSCULAR | Status: AC
  Administered 2022-02-07: 30 mg via INTRAMUSCULAR
  Filled 2022-02-07: qty 2

## 2022-02-07 MED ORDER — PREDNISONE 20 MG PO TABS: ORAL_TABLET | ORAL | 0 refills | Status: DC

## 2022-02-07 MED ORDER — AMOXICILLIN-POT CLAVULANATE 875-125 MG PO TABS
1.0000 | ORAL_TABLET | Freq: Once | ORAL | Status: AC
  Administered 2022-02-07: 1 via ORAL
  Filled 2022-02-07: qty 1

## 2022-02-07 NOTE — Telephone Encounter (Unsigned)
Patient unable to fill script at Pharmacy so it was resent electronically. Initial difficulty 2/2 insurance approval but I discussed with pharmacy and they confirmed receipt of correct rx.

## 2022-02-07 NOTE — Discharge Instructions (Addendum)
Take antibiotic and steroid as prescribed.  Return to the ER for worsening symptoms, persistent vomiting, difficulty breathing or other concerns.

## 2022-02-07 NOTE — ED Provider Notes (Signed)
[facility] Provider Note    Event Date/Time   First MD Initiated Contact with Patient 02/07/22 0251     (approximate)   History   Headache and Facial Pain   HPI  Allison Brennan is a 56 y.o. female presents with history, sinus congestion, productive cough and left ear pain.  Also endorses nausea.  Denies fever, chest pain, shortness of breath, abdominal pain, vomiting or dizziness.     Past Medical History   Past Medical History:  Diagnosis Date  . Back pain, lumbosacral   . COPD (chronic obstructive pulmonary disease) (HCC)    no pulmonologist  . PIRJJOAC(166.0)   . Hypertension   . PPD positive, treated 1998     Active Problem List   Patient Active Problem List   Diagnosis Date Noted  . Acute pain of right shoulder 10/28/2021  . Chronic pain of both knees 09/22/2020  . Chronic thumb pain, bilateral 09/22/2020  . HLD (hyperlipidemia) 09/10/2017  . Colon cancer screening 07/30/2015  . Lumbago 11/13/2011  . Essential hypertension 08/29/2011     Past Surgical History   Past Surgical History:  Procedure Laterality Date  . CESAREAN SECTION     x2  . CLOSED REDUCTION FINGER WITH PERCUTANEOUS PINNING Left 06/17/2015   Procedure: CLOSED REDUCTION FINGER WITH PERCUTANEOUS PINNING little finger;  Surgeon: Kennedy Bucker, MD;  Location: [facility];  Service: Orthopedics;  Laterality: Left;  . EXOSTECTECTOMY TOE Right 02/12/2015   Procedure: EXOSTECTECTOMY TOE;  Surgeon: Linus Galas, MD;  Location: [facility];  Service: Podiatry;  Laterality: Right;  . KNEE ARTHROSCOPY  03/13/2012   Procedure: ARTHROSCOPY KNEE;  Surgeon: Loanne Drilling, MD;  Location: [facility];  Service: Orthopedics;  Laterality: Right;  debridementt right medial meniscus/chrondroplasty  . PARTIAL HYSTERECTOMY  1999   pelvic pain     Home Medications   Prior to Admission medications   Medication Sig Start Date End Date Taking? Authorizing Provider  albuterol (VENTOLIN HFA) 108  (90 Base) MCG/ACT inhaler INHALE 2 PUFFS INTO LUNGS EVERY 4 HOURS AS NEEDED FOR WHEEZING OR  SHORTNESS  OF  BREATH 10/11/21   Corky Downs, MD  diclofenac Sodium (VOLTAREN) 1 % GEL Apply 2 g topically 4 (four) times daily. 10/28/21   Kara Dies, NP  fluticasone-salmeterol (ADVAIR DISKUS) 100-50 MCG/ACT AEPB INHALE 1 DOSE BY MOUTH TWICE DAILY 10/10/21   Corky Downs, MD  losartan-hydrochlorothiazide (HYZAAR) 50-12.5 MG tablet Take 1 tablet by mouth daily. 10/10/21   Corky Downs, MD  meloxicam (MOBIC) 7.5 MG tablet Take 1 tablet (7.5 mg total) by mouth daily. 10/28/21   Kara Dies, NP  pravastatin (PRAVACHOL) 40 MG tablet Take 1 tablet (40 mg total) by mouth daily. 10/28/21   Kara Dies, NP     Allergies  Lisinopril   Family History   Family History  Problem Relation Age of Onset  . Diverticulitis Mother   . Deep vein thrombosis Mother   . Arthritis Maternal Grandmother   . Osteoporosis Maternal Grandmother   . Cancer Maternal Aunt        brain  . Breast cancer Neg Hx      Physical Exam  Triage Vital Signs: ED Triage Vitals  Enc Vitals Group     BP 02/06/22 2231 (!) 173/96     Pulse Rate 02/06/22 2231 76     Resp 02/06/22 2231 18     Temp 02/06/22 2231 98.2 F (36.8 C)     Temp Source 02/06/22 2231  Oral     SpO2 02/06/22 2231 95 %     Weight 02/06/22 2228 177 lb (80.3 kg)     Height 02/06/22 2228 5\' 3"  (1.6 m)     Head Circumference --      Peak Flow --      Pain Score 02/06/22 2228 6     Pain Loc --      Pain Edu? --      Excl. in GC? --     Updated Vital Signs: BP (!) 164/61 (BP Location: Right Arm)   Pulse 81   Temp 98.3 F (36.8 C)   Resp 18   Ht 5\' 3"  (1.6 m)   Wt 80.3 kg   SpO2 96%   BMI 31.35 kg/m    General: Awake, mild distress.  CV:  RRR.  Good peripheral perfusion.  Resp:  Normal effort.  CTAB. Abd:  Nontender.  No distention.  Other:  Left TM not erythematous with fluid.  Frontal and maxillary sinuses tender to palpation.   Nasal congestion noted.  Mildly erythematous posterior oropharynx with postnasal drip noted.  No tonsillar exudate, swelling or peritonsillar abscess.  Mildly hoarse voice.  There is no muffled voice or drooling.  Shotty anterior cervical lymphadenopathy.   ED Results / Procedures / Treatments  Labs (all labs ordered are listed, but only abnormal results are displayed) Labs Reviewed  SARS CORONAVIRUS 2 BY RT PCR     EKG  None   RADIOLOGY I have independently visualized and interpreted patient's chest x-ray as well as noted the radiology interpretation:  Chest x-ray:  Official radiology report(s): DG Chest 2 View  Result Date: 02/07/2022 CLINICAL DATA:  Cough.  Nausea.  Current smoker. EXAM: CHEST - 2 VIEW COMPARISON:  Chest radiograph 05/12/2020. FINDINGS: The cardiomediastinal contours are normal. Mild hyperinflation. Subsegmental atelectasis in the lung bases. Pulmonary vasculature is normal. No consolidation, pleural effusion, or pneumothorax. No acute osseous abnormalities are seen. IMPRESSION: Mild hyperinflation with subsegmental atelectasis in the lung bases. Electronically Signed   By: 04/09/2022 M.D.   On: 02/07/2022 03:27     PROCEDURES:  Critical Care performed: No  Procedures   MEDICATIONS ORDERED IN ED: Medications  ketorolac (TORADOL) injection 30 mg (30 mg Intramuscular Given 02/07/22 0323)  predniSONE (DELTASONE) tablet 60 mg (60 mg Oral Given 02/07/22 0321)  amoxicillin-clavulanate (AUGMENTIN) 875-125 MG per tablet 1 tablet (1 tablet Oral Given 02/07/22 0321)     IMPRESSION / MDM / ASSESSMENT AND PLAN / ED COURSE  I reviewed the triage vital signs and the nursing notes.                             56 year old female presenting with sinus pain, cough, headache, ear pain, congestion.  Differential diagnosis includes but is not limited to viral process, community-acquired pneumonia, sinusitis, etc. I have personally reviewed patient's records and see that  she had a last PCP office visit on 10/28/2021 for right shoulder pain.  Patient's presentation is most consistent with acute complicated illness / injury requiring diagnostic workup.  COVID swab is pending.  Will obtain chest x-ray.  Administer IM ketorolac, start prednisone and Augmentin.  Will reassess.  Clinical Course as of 02/07/22 0339  Tue Feb 07, 2022  0339 Updated patient on negative COVID and chest x-ray results.  Will discharge home on Augmentin and prednisone burst.  Strict return precautions given.  Patient verbalizes understanding and agrees with plan  of care [JS]    Clinical Course User Index [JS] Irean Hong, MD     FINAL CLINICAL IMPRESSION(S) / ED DIAGNOSES   Final diagnoses:  Acute sinusitis, recurrence not specified, unspecified location  Dysfunction of left eustachian tube  Acute cough     Rx / DC Orders   ED Discharge Orders     None        Note:  This document was prepared using Dragon voice recognition software and may include unintentional dictation errors.   Irean Hong, MD 02/07/22 [phone]

## 2022-02-07 NOTE — ED Notes (Signed)
Pt Dc to home, Dc instructions reviewed with all questions answered. Pt verbalizes understanding. Ambulatory out of dept with steady gait

## 2022-03-11 ENCOUNTER — Other Ambulatory Visit: Payer: Self-pay | Admitting: *Deleted

## 2022-03-11 DIAGNOSIS — Z1231 Encounter for screening mammogram for malignant neoplasm of breast: Secondary | ICD-10-CM

## 2022-03-14 ENCOUNTER — Emergency Department
Admission: EM | Admit: 2022-03-14 | Discharge: 2022-03-14 | Disposition: A | Discharge status: Home / Self Care | Payer: BC Managed Care – PPO | Attending: Emergency Medicine | Admitting: Emergency Medicine

## 2022-03-14 ENCOUNTER — Emergency Department: Payer: BC Managed Care – PPO

## 2022-03-14 ENCOUNTER — Other Ambulatory Visit: Payer: Self-pay

## 2022-03-14 ENCOUNTER — Encounter: Payer: Self-pay | Admitting: Emergency Medicine

## 2022-03-14 DIAGNOSIS — Z85038 Personal history of other malignant neoplasm of large intestine: Secondary | ICD-10-CM | POA: Insufficient documentation

## 2022-03-14 DIAGNOSIS — M7989 Other specified soft tissue disorders: Secondary | ICD-10-CM | POA: Diagnosis not present

## 2022-03-14 DIAGNOSIS — M25561 Pain in right knee: Secondary | ICD-10-CM | POA: Insufficient documentation

## 2022-03-14 DIAGNOSIS — I10 Essential (primary) hypertension: Secondary | ICD-10-CM | POA: Diagnosis not present

## 2022-03-14 MED ORDER — ACETAMINOPHEN 325 MG PO TABS
650.0000 mg | ORAL_TABLET | Freq: Once | ORAL | Status: AC
  Administered 2022-03-14: 650 mg via ORAL
  Filled 2022-03-14: qty 2

## 2022-03-14 MED ORDER — IBUPROFEN 600 MG PO TABS
600.0000 mg | ORAL_TABLET | Freq: Once | ORAL | Status: AC
  Administered 2022-03-14: 600 mg via ORAL
  Filled 2022-03-14: qty 1

## 2022-03-14 NOTE — ED Provider Notes (Signed)
[facility] Provider Note    Event Date/Time   First MD Initiated Contact with Patient 03/14/22 2227     (approximate)   History   Knee Pain   HPI  Allison Brennan is a 56 y.o. female with a past medical history of hypertension, hyperlipidemia who presents today for evaluation of right knee pain that has been ongoing for the past couple of days.  Patient reports that her pain began after she was running in the rain.  She reports that she might have tweaked her knee.  She has been able to walk.  She reports that her pain is mostly on the outside of her knee.  She denies having noticed any swelling, redness, or warmth.  She reports that she had an MCL surgery in 2014.  She denies any paresthesias.  No hip pain or ankle pain.  She is still able to ambulate.  Patient Active Problem List   Diagnosis Date Noted   Acute pain of right shoulder 10/28/2021   Chronic pain of both knees 09/22/2020   Chronic thumb pain, bilateral 09/22/2020   HLD (hyperlipidemia) 09/10/2017   Colon cancer screening 07/30/2015   Lumbago 11/13/2011   Essential hypertension 08/29/2011          Physical Exam   Triage Vital Signs: ED Triage Vitals [03/14/22 2225]  Enc Vitals Group     BP      Pulse      Resp      Temp      Temp src      SpO2      Weight 179 lb (81.2 kg)     Height 5\' 3"  (1.6 m)     Head Circumference      Peak Flow      Pain Score 9     Pain Loc      Pain Edu?      Excl. in GC?     Most recent vital signs: Vitals:   03/14/22 2228  BP: (!) 139/97  Pulse: 73  Resp: 20  Temp: 98.1 F (36.7 C)  SpO2: 96%    Physical Exam Vitals and nursing note reviewed.  Constitutional:      General: Awake and alert. No acute distress.    Appearance: Normal appearance. The patient is obese.  HENT:     Head: Normocephalic and atraumatic.     Mouth: Mucous membranes are moist.  Eyes:     General: PERRL. Normal EOMs        Right eye: No discharge.         Left eye: No discharge.     Conjunctiva/sclera: Conjunctivae normal.  Cardiovascular:     Rate and Rhythm: Normal rate and regular rhythm.     Pulses: Normal pulses.     Heart sounds: Normal heart sounds Pulmonary:     Effort: Pulmonary effort is normal. No respiratory distress.     Breath sounds: Normal breath sounds.  Abdominal:     Abdomen is soft. There is no abdominal tenderness. No rebound or guarding. No distention. Musculoskeletal:        General: No swelling. Normal range of motion.     Cervical back: Normal range of motion and neck supple.  Right knee: No deformity or rash. No joint line tenderness. No patellar tenderness, no ballotment Warm and well perfused extremity with 2+ pedal pulses 5/5 strength to dorsiflexion and plantarflexion at the ankle with intact sensation throughout extremity Normal range of  motion of the knee, with intact flexion and extension to active and passive range of motion. Extensor mechanism intact. No ligamentous laxity. Negative anterior/posterior drawer/negative lachman, negative mcmurrays No effusion or warmth Intact quadriceps, hamstring function, patellar tendon function Pelvis stable Full ROM of ankle without pain or swelling Foot warm and well perfused Skin:    General: Skin is warm and dry.     Capillary Refill: Capillary refill takes less than 2 seconds.     Findings: No rash.  Neurological:     Mental Status: The patient is awake and alert.      ED Results / Procedures / Treatments   Labs (all labs ordered are listed, but only abnormal results are displayed) Labs Reviewed - No data to display   EKG     RADIOLOGY I independently reviewed and interpreted imaging and agree with radiologists findings.     PROCEDURES:  Critical Care performed:   Procedures   MEDICATIONS ORDERED IN ED: Medications  acetaminophen (TYLENOL) tablet 650 mg (650 mg Oral Given 03/14/22 2248)  ibuprofen (ADVIL) tablet 600 mg (600 mg Oral  Given 03/14/22 2248)     IMPRESSION / MDM / ASSESSMENT AND PLAN / ED COURSE  I reviewed the triage vital signs and the nursing notes.  Differential diagnosis includes, but is not limited to, considered differential diagnoses include effusion, sprain, contusion, dislocation, fracture, joint infection, tendon rupture. No evidence of neurological deficit or vascular compromise on exam.  Her foot is warm and well-perfused, she has normal pedal pulses, temperature of foot is equal to opposite, normal capillary refill, do not suspect vascular injury.  No fracture/dislocation on X-Ray.  She does have mild joint space narrowing which I discussed with her.  No deformity or obvious ligamentous laxity on exam. No constitutional symptoms or effusion to suggest septic joint. No history of immunosuppression. Overall well appearing, vital signs stable.  She is able to range her knee fully and completely.  No indication for diagnostic or therapeutic procedure such as arthrocentesis.  No pitting edema, legs are equal in circumference bilaterally, no recent periods of immobilization, recent surgeries, recent travel, or history of PE/DVT to suggest DVT as a cause of her pain today.  No calf pain or tenderness on exam.  She was treated symptomatically with improvement of her symptoms.  Return precautions and care instructions discussed. Outpatient follow-up advised.  She was given the appropriate follow-up for orthopedics.  Patient agrees with plan of care.  She requested a work note which was provided.   Patient's presentation is most consistent with acute complicated illness / injury requiring diagnostic workup.      FINAL CLINICAL IMPRESSION(S) / ED DIAGNOSES   Final diagnoses:  Acute pain of right knee     Rx / DC Orders   ED Discharge Orders     None        Note:  This document was prepared using Dragon voice recognition software and may include unintentional dictation errors.   Keturah Shavers 03/14/22 2313    Chesley Noon, MD 03/14/22 [phone]

## 2022-03-14 NOTE — ED Triage Notes (Signed)
Pt to triage via w/c with no distress noted; st rt pain/swelling since Sunday; denies specific injury but reports prev surgery

## 2022-03-14 NOTE — Discharge Instructions (Addendum)
You may continue to wear your knee brace while you are ambulating, remove it when you sleep at night.  Please follow-up with the orthopedic doctor.  Continue to apply ice, 20 minutes on, 20 minutes off multiple times per day.  Remember to put a thin towel between the ice and your skin to avoid injuring your skin.  Please return for any new, worsening, or change in symptoms or other concerns.  It was a pleasure caring for you today.

## 2022-03-31 ENCOUNTER — Ambulatory Visit
Admission: RE | Admit: 2022-03-31 | Discharge: 2022-03-31 | Disposition: A | Discharge status: Home / Self Care | Payer: BC Managed Care – PPO | Source: Ambulatory Visit | Attending: Internal Medicine | Admitting: Internal Medicine

## 2022-03-31 DIAGNOSIS — Z1231 Encounter for screening mammogram for malignant neoplasm of breast: Secondary | ICD-10-CM | POA: Insufficient documentation

## 2022-06-09 ENCOUNTER — Ambulatory Visit: Payer: BC Managed Care – PPO | Admitting: Nurse Practitioner

## 2022-06-09 ENCOUNTER — Encounter: Payer: Self-pay | Admitting: Nurse Practitioner

## 2022-06-09 VITALS — BP 140/82 | HR 71 | Ht 63.0 in | Wt 180.3 lb

## 2022-06-09 DIAGNOSIS — R519 Headache, unspecified: Secondary | ICD-10-CM | POA: Diagnosis not present

## 2022-06-09 DIAGNOSIS — I10 Essential (primary) hypertension: Secondary | ICD-10-CM | POA: Diagnosis not present

## 2022-06-09 DIAGNOSIS — E669 Obesity, unspecified: Secondary | ICD-10-CM | POA: Diagnosis not present

## 2022-06-09 DIAGNOSIS — M5442 Lumbago with sciatica, left side: Secondary | ICD-10-CM | POA: Diagnosis not present

## 2022-06-09 DIAGNOSIS — G8929 Other chronic pain: Secondary | ICD-10-CM

## 2022-06-09 DIAGNOSIS — Z6831 Body mass index (BMI) 31.0-31.9, adult: Secondary | ICD-10-CM

## 2022-06-09 MED ORDER — GABAPENTIN 100 MG PO CAPS: 100.0000 mg | ORAL_CAPSULE | Freq: Two times a day (BID) | ORAL | 3 refills | Status: DC

## 2022-06-09 NOTE — Progress Notes (Unsigned)
Established Patient Office Visit  Subjective:  Patient ID: Allison Brennan, female    DOB: 03/06/1966  Age: 56 y.o. MRN: [id number]  CC:  Chief Complaint  Patient presents with   Hypertension    Pt here for BP recheck, mother has hxt Afib with srokes and blood clots      HPI  Allison Brennan presents for blood pressure follow up. She is taking losartan HCTZ 50-12.5 mg daily. She has history of headache. States that nothing helps with the headache other than BC powder.   HPI   Past Medical History:  Diagnosis Date   Back pain, lumbosacral    COPD (chronic obstructive pulmonary disease) ([place])    no pulmonologist   Headache(784.0)    Hypertension    PPD positive, treated 1998    Past Surgical History:  Procedure Laterality Date   CESAREAN SECTION     x2   CLOSED REDUCTION FINGER WITH PERCUTANEOUS PINNING Left 06/17/2015   Procedure: CLOSED REDUCTION FINGER WITH PERCUTANEOUS PINNING little finger;  Surgeon: Hessie Knows, MD;  Location: [facility];  Service: Orthopedics;  Laterality: Left;   EXOSTECTECTOMY TOE Right 02/12/2015   Procedure: EXOSTECTECTOMY TOE;  Surgeon: Sharlotte Alamo, MD;  Location: [facility];  Service: Podiatry;  Laterality: Right;   KNEE ARTHROSCOPY  03/13/2012   Procedure: ARTHROSCOPY KNEE;  Surgeon: Gearlean Alf, MD;  Location: [facility];  Service: Orthopedics;  Laterality: Right;  debridementt right medial meniscus/chrondroplasty   PARTIAL HYSTERECTOMY  1999   pelvic pain    Family History  Problem Relation Age of Onset   Diverticulitis Mother    Deep vein thrombosis Mother    Arthritis Maternal Grandmother    Osteoporosis Maternal Grandmother    Cancer Maternal Aunt        brain   Breast cancer Neg Hx     Social History   Socioeconomic History   Marital status: Widowed    Spouse name: Not on file   Number of children: 2   Years of education: Not on file   Highest education level: Not on file  Occupational History   Occupation: [employer]     Employer: [employer]  Tobacco Use   Smoking status: Every Day    Packs/day: 1.00    Years: 28.00    Total pack years: 28.00    Types: Cigarettes   Smokeless tobacco: Never   Tobacco comments:    Decline Cessation information/SLS  Vaping Use   Vaping Use: Never used  Substance and Sexual Activity   Alcohol use: Yes    Alcohol/week: 2.0 standard drinks of alcohol    Types: 2 Cans of beer per week    Comment: 1-2 drinks beer every day   Drug use: No   Sexual activity: Not Currently  Other Topics Concern   Not on file  Social History Narrative   Lives alone in [place], no pets. Both children living with her.       Works in Audiological scientist.       Regular Exercise -  NO   Daily Caffeine Use:  6 - 8 sodas   Diet-   Social Determinants of Health   Financial Resource Strain: Not on file  Food Insecurity: Not on file  Transportation Needs: Not on file  Physical Activity: Not on file  Stress: Not on file  Social Connections: Not on file  Intimate Partner Violence: Not on file     Outpatient Medications Prior to Visit  Medication Sig Dispense Refill   albuterol (VENTOLIN HFA) 108 (90 Base) MCG/ACT inhaler INHALE 2 PUFFS INTO LUNGS EVERY 4 HOURS AS NEEDED FOR WHEEZING OR  SHORTNESS  OF  BREATH 18 g 0   amoxicillin-clavulanate (AUGMENTIN) 875-125 MG tablet Take 1 tablet by mouth 2 (two) times daily. 14 tablet 0   diclofenac Sodium (VOLTAREN) 1 % GEL Apply 2 g topically 4 (four) times daily. 150 g 1   fluticasone-salmeterol (ADVAIR DISKUS) 100-50 MCG/ACT AEPB INHALE 1 DOSE BY MOUTH TWICE DAILY 60 each 3   HYDROcodone bit-homatropine (HYDROMET) 5-1.5 MG/5ML syrup Take 5 mLs by mouth every 6 (six) hours as needed for cough. 80 mL 0   losartan-hydrochlorothiazide (HYZAAR) 50-12.5 MG tablet Take 1 tablet by mouth daily. 90 tablet 3   meloxicam (MOBIC) 7.5 MG tablet Take 1 tablet (7.5 mg total) by mouth daily. 30 tablet 0   pravastatin (PRAVACHOL) 40 MG tablet Take 1  tablet (40 mg total) by mouth daily. 90 tablet 1   predniSONE (DELTASONE) 20 MG tablet 3 tablets daily x 4 days 12 tablet 0   No facility-administered medications prior to visit.    Allergies  Allergen Reactions   Lisinopril Cough    ROS Review of Systems  Constitutional: Negative.   HENT:  Positive for congestion.   Eyes: Negative.   Respiratory:  Negative for chest tightness and shortness of breath.   Cardiovascular:  Negative for chest pain and palpitations.  Gastrointestinal: Negative.   Endocrine: Negative.   Musculoskeletal:  Positive for arthralgias (shoulder pain) and neck pain.  Neurological:  Positive for headaches. Negative for dizziness and facial asymmetry.  Psychiatric/Behavioral: Negative.        Objective:    Physical Exam Constitutional:      Appearance: Normal appearance. She is obese.  HENT:     Head: Normocephalic.     Right Ear: Tympanic membrane normal.     Left Ear: Tympanic membrane normal.     Nose: Nose normal.     Mouth/Throat:     Mouth: Mucous membranes are moist.     Pharynx: Oropharynx is clear.  Eyes:     Extraocular Movements: Extraocular movements intact.     Conjunctiva/sclera: Conjunctivae normal.     Pupils: Pupils are equal, round, and reactive to light.  Cardiovascular:     Rate and Rhythm: Normal rate and regular rhythm.     Pulses: Normal pulses.     Heart sounds: Normal heart sounds.  Pulmonary:     Effort: Pulmonary effort is normal. No respiratory distress.     Breath sounds: Normal breath sounds. No rhonchi.  Abdominal:     General: Bowel sounds are normal.     Palpations: Abdomen is soft. There is no mass.     Tenderness: There is no abdominal tenderness.     Hernia: No hernia is present.  Musculoskeletal:        General: Normal range of motion.     Cervical back: Neck supple. No tenderness.  Skin:    General: Skin is warm.     Capillary Refill: Capillary refill takes less than 2 seconds.  Neurological:      General: No focal deficit present.     Mental Status: She is alert and oriented to person, place, and time. Mental status is at baseline.  Psychiatric:        Mood and Affect: Mood normal.        Behavior: Behavior normal.  Thought Content: Thought content normal.        Judgment: Judgment normal.     There were no vitals taken for this visit. Wt Readings from Last 3 Encounters:  03/14/22 179 lb (81.2 kg)  02/06/22 177 lb (80.3 kg)  10/28/21 179 lb 1.6 oz (81.2 kg)     Health Maintenance  Topic Date Due   COVID-19 Vaccine (1) Never done   COLONOSCOPY (Pts 45-63yr Insurance coverage will need to be confirmed)  09/12/2012   Zoster Vaccines- Shingrix (1 of 2) Never done   PAP SMEAR-Modifier  04/04/2019   Lung Cancer Screening  01/18/2020   INFLUENZA VACCINE  01/31/2022   MAMMOGRAM  03/31/2024   DTaP/Tdap/Td (2 - Tdap) 12/19/2028   Hepatitis C Screening  Completed   HIV Screening  Completed   HPV VACCINES  Aged Out    There are no preventive care reminders to display for this patient.  Lab Results  Component Value Date   TSH 1.12 10/11/2021   Lab Results  Component Value Date   WBC 7.3 10/11/2021   HGB 16.6 (H) 10/11/2021   HCT 50.0 (H) 10/11/2021   MCV 92.1 10/11/2021   PLT 275 10/11/2021   Lab Results  Component Value Date   NA 145 10/11/2021   K 4.8 10/11/2021   CO2 21 10/11/2021   GLUCOSE 76 10/11/2021   BUN 14 10/11/2021   CREATININE 0.83 10/11/2021   BILITOT 0.3 10/11/2021   ALKPHOS 86 11/27/2019   AST 15 10/11/2021   ALT 10 10/11/2021   PROT 7.7 10/11/2021   ALBUMIN 4.4 11/27/2019   CALCIUM 10.2 10/11/2021   ANIONGAP 10 05/12/2020   EGFR 82 10/11/2021   GFR 81.97 09/10/2017   Lab Results  Component Value Date   CHOL 263 (H) 10/11/2021   Lab Results  Component Value Date   HDL 44 (L) 10/11/2021   Lab Results  Component Value Date   [facility] 10/[facility]     Comment:     . LDL cholesterol not calculated. Triglyceride levels greater  than 400 mg/dL invalidate calculated LDL results. . Reference range: <100 . Desirable range <100 mg/dL for primary prevention;   <70 mg/dL for patients with CHD or diabetic patients  with > or = 2 CHD risk factors. .Marland KitchenLDL-C is now calculated using the Martin-Hopkins  calculation, which is a validated novel method providing  better accuracy than the Friedewald equation in the  estimation of LDL-C.  MCresenciano Genreet al. JAnnamaria Helling [id number];469(62: [id number]  (http://education.[employer].com/faq/FAQ164)    Lab Results  Component Value Date   TRIG 487 (H) 10/11/2021   Lab Results  Component Value Date   CHOLHDL 6.0 (H) 10/11/2021   Lab Results  Component Value Date   HGBA1C 5.4 12/20/2018      Assessment & Plan:   Problem List Items Addressed This Visit   None    No orders of the defined types were placed in this encounter.    Follow-up: No follow-ups on file.    CTheresia Lo NP

## 2022-06-10 NOTE — Progress Notes (Incomplete)
Established Patient Office Visit  Subjective:  Patient ID: Allison Brennan, female    DOB: 04/28/1966  Age: 56 y.o. MRN: [id number]  CC:  Chief Complaint  Patient presents with  . Hypertension    Pt here for BP recheck, mother has hxt Afib with srokes and blood clots      HPI  Allison Brennan presents for blood pressure follow up. She is taking losartan HCTZ 50-12.5 mg daily. She has history of headache and back pain. She states that nothing helps with the headache other than BC powder.   HPI   Past Medical History:  Diagnosis Date  . Back pain, lumbosacral   . COPD (chronic obstructive pulmonary disease) ([place])    no pulmonologist  . [id number](097.3)   . Hypertension   . PPD positive, treated 1998    Past Surgical History:  Procedure Laterality Date  . CESAREAN SECTION     x2  . CLOSED REDUCTION FINGER WITH PERCUTANEOUS PINNING Left 06/17/2015   Procedure: CLOSED REDUCTION FINGER WITH PERCUTANEOUS PINNING little finger;  Surgeon: Hessie Knows, MD;  Location: [facility] ORS;  Service: Orthopedics;  Laterality: Left;  . EXOSTECTECTOMY TOE Right 02/12/2015   Procedure: EXOSTECTECTOMY TOE;  Surgeon: Sharlotte Alamo, MD;  Location: [facility] ORS;  Service: Podiatry;  Laterality: Right;  . KNEE ARTHROSCOPY  03/13/2012   Procedure: ARTHROSCOPY KNEE;  Surgeon: Gearlean Alf, MD;  Location: [facility];  Service: Orthopedics;  Laterality: Right;  debridementt right medial meniscus/chrondroplasty  . PARTIAL HYSTERECTOMY  1999   pelvic pain    Family History  Problem Relation Age of Onset  . Diverticulitis Mother   . Deep vein thrombosis Mother   . Arthritis Maternal Grandmother   . Osteoporosis Maternal Grandmother   . Cancer Maternal Aunt        brain  . Breast cancer Neg Hx     Social History   Socioeconomic History  . Marital status: Widowed    Spouse name: Not on file  . Number of children: 2  . Years of education: Not on file  . Highest education level: Not on file  Occupational  History  . 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 Smoking status: Every Day    Packs/day: 1.00    Years: 28.00    Total pack years: 28.00    Types: Cigarettes  . Smokeless tobacco: Never  . Tobacco comments:    Decline Cessation information/SLS  Vaping Use  . Vaping Use: Never used  Substance and Sexual Activity  . Alcohol use: Yes    Alcohol/week: 2.0 standard drinks of alcohol    Types: 2 Cans of beer per week    Comment: 1-2 drinks beer every day  . Drug use: No  . Sexual activity: Not Currently  Other Topics Concern  . Not on file  Social History Narrative   Lives alone in [place], no pets. Both children living with her.       Works in Audiological scientist.       Regular Exercise -  NO   Daily Caffeine Use:  6 - 8 sodas   Diet-   Social Determinants of Health   Financial Resource Strain: Not on file  Food Insecurity: Not on file  Transportation Needs: Not on file  Physical Activity: Not on file  Stress: Not on file  Social Connections: Not on file  Intimate Partner Violence: Not on file     Outpatient  Medications Prior to Visit  Medication Sig Dispense Refill  . albuterol (VENTOLIN HFA) 108 (90 Base) MCG/ACT inhaler INHALE 2 PUFFS INTO LUNGS EVERY 4 HOURS AS NEEDED FOR WHEEZING OR  SHORTNESS  OF  BREATH 18 g 0  . amoxicillin-clavulanate (AUGMENTIN) 875-125 MG tablet Take 1 tablet by mouth 2 (two) times daily. 14 tablet 0  . diclofenac Sodium (VOLTAREN) 1 % GEL Apply 2 g topically 4 (four) times daily. 150 g 1  . fluticasone-salmeterol (ADVAIR DISKUS) 100-50 MCG/ACT AEPB INHALE 1 DOSE BY MOUTH TWICE DAILY 60 each 3  . HYDROcodone bit-homatropine (HYDROMET) 5-1.5 MG/5ML syrup Take 5 mLs by mouth every 6 (six) hours as needed for cough. 80 mL 0  . losartan-hydrochlorothiazide (HYZAAR) 50-12.5 MG tablet Take 1 tablet by mouth daily. 90 tablet 3  . meloxicam (MOBIC) 7.5 MG tablet Take 1 tablet (7.5 mg total) by mouth daily. 30 tablet  0  . pravastatin (PRAVACHOL) 40 MG tablet Take 1 tablet (40 mg total) by mouth daily. 90 tablet 1  . predniSONE (DELTASONE) 20 MG tablet 3 tablets daily x 4 days 12 tablet 0   No facility-administered medications prior to visit.    Allergies  Allergen Reactions  . Lisinopril Cough    ROS Review of Systems  Constitutional: Negative.   HENT:  Positive for congestion.   Eyes: Negative.   Respiratory:  Negative for chest tightness and shortness of breath.   Cardiovascular:  Negative for chest pain and palpitations.  Gastrointestinal: Negative.   Endocrine: Negative.   Musculoskeletal:  Positive for arthralgias (shoulder pain) and neck pain.  Neurological:  Positive for headaches. Negative for dizziness and facial asymmetry.  Psychiatric/Behavioral: Negative.        Objective:    Physical Exam Constitutional:      Appearance: Normal appearance. She is obese.  HENT:     Head: Normocephalic.     Right Ear: Tympanic membrane normal.     Left Ear: Tympanic membrane normal.     Nose: Nose normal.     Mouth/Throat:     Mouth: Mucous membranes are moist.     Pharynx: Oropharynx is clear.  Eyes:     Extraocular Movements: Extraocular movements intact.     Conjunctiva/sclera: Conjunctivae normal.     Pupils: Pupils are equal, round, and reactive to light.  Cardiovascular:     Rate and Rhythm: Normal rate and regular rhythm.     Pulses: Normal pulses.     Heart sounds: Normal heart sounds.  Pulmonary:     Effort: Pulmonary effort is normal. No respiratory distress.     Breath sounds: Normal breath sounds. No rhonchi.  Abdominal:     General: Bowel sounds are normal.     Palpations: Abdomen is soft. There is no mass.     Tenderness: There is no abdominal tenderness.     Hernia: No hernia is present.  Musculoskeletal:        General: Normal range of motion.     Cervical back: Neck supple. No tenderness.  Skin:    General: Skin is warm.     Capillary Refill: Capillary  refill takes less than 2 seconds.  Neurological:     General: No focal deficit present.     Mental Status: She is alert and oriented to person, place, and time. Mental status is at baseline.  Psychiatric:        Mood and Affect: Mood normal.        Behavior: Behavior normal.  Thought Content: Thought content normal.        Judgment: Judgment normal.     There were no vitals taken for this visit. Wt Readings from Last 3 Encounters:  03/14/22 179 lb (81.2 kg)  02/06/22 177 lb (80.3 kg)  10/28/21 179 lb 1.6 oz (81.2 kg)     Health Maintenance  Topic Date Due  . COVID-19 Vaccine (1) Never done  . COLONOSCOPY (Pts 45-55yr Insurance coverage will need to be confirmed)  09/12/2012  . Zoster Vaccines- Shingrix (1 of 2) Never done  . PAP SMEAR-Modifier  04/04/2019  . Lung Cancer Screening  01/18/2020  . INFLUENZA VACCINE  01/31/2022  . MAMMOGRAM  03/31/2024  . DTaP/Tdap/Td (2 - Tdap) 12/19/2028  . Hepatitis C Screening  Completed  . HIV Screening  Completed  . HPV VACCINES  Aged Out    There are no preventive care reminders to display for this patient.  Lab Results  Component Value Date   TSH 1.12 10/11/2021   Lab Results  Component Value Date   WBC 7.3 10/11/2021   HGB 16.6 (H) 10/11/2021   HCT 50.0 (H) 10/11/2021   MCV 92.1 10/11/2021   PLT 275 10/11/2021   Lab Results  Component Value Date   NA 145 10/11/2021   K 4.8 10/11/2021   CO2 21 10/11/2021   GLUCOSE 76 10/11/2021   BUN 14 10/11/2021   CREATININE 0.83 10/11/2021   BILITOT 0.3 10/11/2021   ALKPHOS 86 11/27/2019   AST 15 10/11/2021   ALT 10 10/11/2021   PROT 7.7 10/11/2021   ALBUMIN 4.4 11/27/2019   CALCIUM 10.2 10/11/2021   ANIONGAP 10 05/12/2020   EGFR 82 10/11/2021   GFR 81.97 09/10/2017   Lab Results  Component Value Date   CHOL 263 (H) 10/11/2021   Lab Results  Component Value Date   HDL 44 (L) 10/11/2021   Lab Results  Component Value Date   [facility]     Comment:      . LDL cholesterol not calculated. Triglyceride levels greater than 400 mg/dL invalidate calculated LDL results. . Reference range: <100 . Desirable range <100 mg/dL for primary prevention;   <70 mg/dL for patients with CHD or diabetic patients  with > or = 2 CHD risk factors. .Marland KitchenLDL-C is now calculated using the Martin-Hopkins  calculation, which is a validated novel method providing  better accuracy than the Friedewald equation in the  estimation of LDL-C.  MCresenciano Genreet al. JAnnamaria Helling 20086;761(95: 2061-2068  (http://education.[employer].com/faq/FAQ164)    Lab Results  Component Value Date   TRIG 487 (H) 10/11/2021   Lab Results  Component Value Date   CHOLHDL 6.0 (H) 10/11/2021   Lab Results  Component Value Date   HGBA1C 5.4 12/20/2018      Assessment & Plan:   Problem List Items Addressed This Visit   None    No orders of the defined types were placed in this encounter.    Follow-up: No follow-ups on file.    CTheresia Lo NP

## 2022-06-11 ENCOUNTER — Encounter: Payer: Self-pay | Admitting: Nurse Practitioner

## 2022-06-11 DIAGNOSIS — E669 Obesity, unspecified: Secondary | ICD-10-CM | POA: Insufficient documentation

## 2022-06-11 NOTE — Assessment & Plan Note (Signed)
Advised patient to make a headache diary. Encouraged her to increase hydration and use Tylenol/ibuprofen for headaches. If needed would send her to headache clinic.

## 2022-06-11 NOTE — Assessment & Plan Note (Signed)
Body mass index is 31.94 kg/m. Advised pt to lose weight. Advised patient to avoid trans fat, fatty and fried food. Follow a regular physical activity schedule.

## 2022-06-11 NOTE — Assessment & Plan Note (Signed)
Patient BP  Vitals:   06/09/22 1232 06/09/22 1253  BP: 136/80 (!) 140/82    in the office today. Encouraged her to follow a low sodium and heart healthy diet. Continue losartan HCTZ 50-12.5 mg daily.

## 2022-06-11 NOTE — Assessment & Plan Note (Signed)
Started her on gabapentin 100 mg twice a day. Will refer her to Ortho if symptoms does not improves.

## 2022-07-04 NOTE — Progress Notes (Signed)
This encounter was created in error - please disregard.

## 2022-09-21 NOTE — Progress Notes (Signed)
New patient visit   Patient: Allison Brennan   DOB: 01/18/1966   57 y.o. Female  MRN: [id number] Visit Date: 09/22/2022  Today's healthcare provider: Gwyneth Sprout, FNP  Patient presents for new patient visit to establish care.  Introduced to Designer, jewellery role and practice setting.  All questions answered.  Discussed provider/patient relationship and expectations.   Chief Complaint  Patient presents with   New Patient (Initial Visit)   Subjective    Allison Brennan is a 57 y.o. female who presents today as a new patient to establish care.  HPI HPI   Establish--left shoulder pain radiated back of the neck worse when lifting--2-3 months.  Last edited by Elta Guadeloupe, CMA on 09/22/2022  3:29 PM.       Past Medical History:  Diagnosis Date   Back pain, lumbosacral    COPD (chronic obstructive pulmonary disease) ([place])    no pulmonologist   Headache(784.0)    Hypertension    PPD positive, treated 1998   Past Surgical History:  Procedure Laterality Date   CESAREAN SECTION     x2   CLOSED REDUCTION FINGER WITH PERCUTANEOUS PINNING Left 06/17/2015   Procedure: CLOSED REDUCTION FINGER WITH PERCUTANEOUS PINNING little finger;  Surgeon: Hessie Knows, MD;  Location: [facility] OR[facility];  Service: Orthopedics;  Laterality: Left;   EXOSTECTECTOMY TOE Right 02/12/2015   Procedure: EXOSTECTECTOMY TOE;  Surgeon: Sharlotte Alamo, MD;  Location: [facility] OR[facility];  Service: Podiatry;  Laterality: Right;   KNEE ARTHROSCOPY  03/13/2012   Procedure: ARTHROSCOPY KNEE;  Surgeon: Gearlean Alf, MD;  Location: [facility];  Service: Orthopedics;  Laterality: Right;  debridementt right medial meniscus/chrondroplasty   PARTIAL HYSTERECTOMY  07/03/1997   pelvic pain   Family Status  Relation Name Status   Mother  Alive   Father  Deceased   MGM  Deceased   Mat Aunt  (Not Specified)   Neg Hx  (Not Specified)   Family History  Problem Relation Age of Onset   Diverticulitis Mother    Deep vein thrombosis Mother     Arthritis Maternal Grandmother    Osteoporosis Maternal Grandmother    Cancer Maternal Aunt        brain   Breast cancer Neg Hx    Social History   Socioeconomic History   Marital status: Widowed    Spouse name: Not on file   Number of children: 2   Years of education: Not on file   Highest education level: Not on file  Occupational History   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 Both children living with her.       Works in Audiological scientist.       Regular Exercise -  NO   Daily Caffeine Use:  6 - 8  sodas   Diet-   Social Determinants of Health   Financial Resource Strain: Not on file  Food Insecurity: Not on file  Transportation Needs: Not on file  Physical Activity: Not on file  Stress: Not on file  Social Connections: Not on file   Outpatient Medications Prior to Visit  Medication Sig   [DISCONTINUED] albuterol (VENTOLIN HFA) 108 (90 Base) MCG/ACT inhaler INHALE 2 PUFFS INTO LUNGS EVERY 4 HOURS AS NEEDED FOR WHEEZING OR  SHORTNESS  OF  BREATH   [DISCONTINUED] diclofenac Sodium (VOLTAREN) 1 % GEL Apply 2 g topically 4 (four) times daily.   [DISCONTINUED] losartan-hydrochlorothiazide (HYZAAR) 50-12.5 MG tablet Take 1 tablet by mouth daily.   gabapentin (NEURONTIN) 100 MG capsule Take 1 capsule (100 mg total) by mouth 2 (two) times daily. (Patient not taking: Reported on 09/22/2022)   [DISCONTINUED] fluticasone-salmeterol (ADVAIR  DISKUS) 100-50 MCG/ACT AEPB INHALE 1 DOSE BY MOUTH TWICE DAILY (Patient not taking: Reported on 09/22/2022)   [DISCONTINUED] pravastatin (PRAVACHOL) 40 MG tablet Take 1 tablet (40 mg total) by mouth daily. (Patient not taking: Reported on 09/22/2022)   No facility-administered medications prior to visit.   Allergies  Allergen Reactions   Lisinopril Cough    Immunization History  Administered Date(s) Administered   Hep A / Hep B 08/30/2015, 09/27/2015   Pneumococcal Polysaccharide-23 12/15/2011, 12/20/2018   Td 12/20/2018   Tetanus 07/03/2006   Zoster Recombinat (Shingrix) 09/22/2022    Health Maintenance  Topic Date Due   INFLUENZA VACCINE  10/01/2022 (Originally 01/31/2022)   COLONOSCOPY (Pts 45-105yrs Insurance coverage will need to be confirmed)  06/10/2023 (Originally 09/12/2012)   Zoster Vaccines- Shingrix (2 of 2) 11/17/2022   MAMMOGRAM  03/31/2024   DTaP/Tdap/Td (2 - Tdap) 12/19/2028   Hepatitis C Screening  Completed   HIV Screening  Completed   HPV VACCINES  Aged Out   Lung Cancer Screening  Discontinued   PAP SMEAR-Modifier  Discontinued   COVID-19 Vaccine  Discontinued    Patient Care Team: Gwyneth Sprout, FNP as PCP - General (Family Medicine)  Review of Systems    Objective    BP (!) 145/80 (BP Location: Right Arm, Patient Position: Sitting, Cuff Size: Large)   Pulse 75   Temp 98.1 F (36.7 C)   Ht 5\' 3"  (1.6 m)   Wt 186 lb (84.4 kg)   SpO2 95%   BMI 32.95 kg/m   Physical Exam Vitals and nursing note reviewed.  Constitutional:      General: She is not in acute distress.    Appearance: Normal appearance. She is obese. She is not ill-appearing, toxic-appearing or diaphoretic.  HENT:     Head: Normocephalic and atraumatic.  Cardiovascular:     Rate and Rhythm: Normal rate and regular rhythm.     Pulses: Normal pulses.     Heart sounds: Normal heart sounds. No murmur heard.    No friction rub. No gallop.  Pulmonary:     Effort: Pulmonary effort is  normal. No respiratory distress.     Breath sounds: Normal breath sounds. No stridor. No wheezing, rhonchi or rales.  Chest:     Chest wall: No tenderness.  Musculoskeletal:        General: Swelling and tenderness present. No deformity or signs of injury. Normal range of motion.       Arms:     Right lower leg: No edema.     Left lower leg: No edema.       Legs:  Skin:    General: Skin is warm and dry.     Capillary Refill: Capillary refill takes less than 2 seconds.     Coloration: Skin is not jaundiced or pale.     Findings: No bruising, erythema, lesion or rash.  Neurological:     General: No focal deficit present.     Mental Status: She is alert and oriented to person, place, and time. Mental status is at baseline.     Cranial Nerves: No cranial nerve deficit.     Sensory: No sensory deficit.     Motor: No weakness.     Coordination: Coordination normal.  Psychiatric:        Mood and Affect: Mood normal.        Behavior: Behavior normal.        Thought Content: Thought content normal.        Judgment: Judgment normal.    Depression Screen    09/22/2020    2:39 PM 12/22/2018    2:46 PM 09/26/2018   12:10 PM 09/10/2017    3:15 PM  PHQ 2/9 Scores  PHQ - 2 Score 0 0 0 0   Results for orders placed or performed in visit on 09/22/22  Lipid panel  Result Value Ref Range   Cholesterol, Total 214 (H) 100 - 199 mg/dL   Triglycerides 341 (H) 0 - 149 mg/dL   HDL 36 (L) >39 mg/dL   VLDL Cholesterol Cal 60 (H) 5 - 40 mg/dL   LDL Chol Calc (NIH) 118 (H) 0 - 99 mg/dL   Chol/HDL Ratio 5.9 (H) 0.0 - 4.4 ratio  CBC with Differential/Platelet  Result Value Ref Range   WBC 8.9 3.4 - 10.8 x10E3/uL   RBC 5.01 3.77 - 5.28 x10E6/uL   Hemoglobin 15.3 11.1 - 15.9 g/dL   Hematocrit 46.1 34.0 - 46.6 %   MCV 92 79 - 97 fL   MCH 30.5 26.6 - 33.0 pg   MCHC 33.2 31.5 - 35.7 g/dL   RDW 12.9 11.7 - 15.4 %   Platelets 248 150 - 450 x10E3/uL   Neutrophils 57 Not Estab. %   Lymphs 30 Not  Estab. %   Monocytes 8 Not Estab. %   Eos 4 Not Estab. %   Basos 1 Not Estab. %   Neutrophils Absolute 5.1 1.4 - 7.0 x10E3/uL   Lymphocytes Absolute 2.6 0.7 - 3.1 x10E3/uL   Monocytes Absolute 0.7 0.1 - 0.9 x10E3/uL   EOS (ABSOLUTE) 0.3 0.0 - 0.4 x10E3/uL   Basophils Absolute 0.1 0.0 - 0.2 x10E3/uL   Immature Granulocytes 0 Not Estab. %   Immature Grans (Abs) 0.0 0.0 - 0.1 x10E3/uL  Comprehensive Metabolic Panel (CMET)  Result Value Ref Range   Glucose 68 (L) 70 - 99 mg/dL   BUN 8 6 - 24 mg/dL   Creatinine, Ser 0.64 0.57 - 1.00 mg/dL   eGFR 102 >59 mL/min/1.73   BUN/Creatinine Ratio 13 9 - 23   Sodium 143 134 - 144 mmol/L   Potassium 4.4 3.5 - 5.2 mmol/L   Chloride 103 96 - 106 mmol/L   CO2 25 20 - 29 mmol/L   Calcium 9.3 8.7 - 10.2 mg/dL   Total Protein 7.2 6.0 - 8.5 g/dL   Albumin 4.2 3.8 - 4.9 g/dL   Globulin, Total 3.0 1.5 - 4.5 g/dL   Albumin/Globulin Ratio 1.4 1.2 - 2.2   Bilirubin Total <0.2 0.0 - 1.2 mg/dL   Alkaline Phosphatase 101 44 - 121 IU/L  AST 20 0 - 40 IU/L   ALT 21 0 - 32 IU/L  TSH + free T4  Result Value Ref Range   TSH 1.250 0.450 - 4.500 uIU/mL   Free T4 1.08 0.82 - 1.77 ng/dL  Hemoglobin A1c  Result Value Ref Range   Hgb A1c MFr Bld 5.9 (H) 4.8 - 5.6 %   Est. average glucose Bld gHb Est-mCnc 123 mg/dL    Assessment & Plan      Problem List Items Addressed This Visit       Cardiovascular and Mediastinum   Essential hypertension    Chronic, elevated Recommend titration of Hyzaar from 50-12.5 to 100-25 to assist in ASCVD risk  Check labs Follow up 1-3 months with cymbalta titration       Relevant Orders   CBC with Differential/Platelet (Completed)   Comprehensive Metabolic Panel (CMET) (Completed)     Respiratory   Chronic obstructive pulmonary disease (HCC)    Chronic, request for additional refills Encourage LDCTS to assist Continue to recommend smoking reduction with goal of cessation to assist Declines use of daily controller  inhaler at this time      Relevant Medications   albuterol (VENTOLIN HFA) 108 (90 Base) MCG/ACT inhaler   fluticasone-salmeterol (ADVAIR DISKUS) 100-50 MCG/ACT AEPB   Other Relevant Orders   Ambulatory Referral Lung Cancer Screening [place] [place] [facility]     Musculoskeletal and Integument   Supraspinatus sprain, left, sequela - Primary    Acute on chronic, interfering with ADLS Trial of muscle relaxants and high dose anti-inflammatories to assist as well as stretching Referral to ortho to assist      Relevant Medications   DULoxetine (CYMBALTA) 30 MG capsule   methocarbamol (ROBAXIN-750) 750 MG tablet   Other Relevant Orders   Ambulatory referral to Orthopedics     Other   Bilateral thumb pain    Acute on chronic, likely overuse disorder; degenerative Recommend trial of conservative therapy and referral to ortho to assist      Relevant Medications   DULoxetine (CYMBALTA) 30 MG capsule   methocarbamol (ROBAXIN-750) 750 MG tablet   Other Relevant Orders   Ambulatory referral to Orthopedics   Chronic pain of both knees    Acute on chronic, continue to recommend weight loss to normal BMI Trial of cymbalta to assist with referral to ortho      Relevant Medications   DULoxetine (CYMBALTA) 30 MG capsule   methocarbamol (ROBAXIN-750) 750 MG tablet   celecoxib (CELEBREX) 200 MG capsule   Other Relevant Orders   Ambulatory referral to Orthopedics   Encounter to establish care with new doctor   HLD (hyperlipidemia)    Chronic, previously elevated Was previous prescribed pravastatin 40 mg Check LP Continue to recommend statins in addition to lifestyle to assist with ASCVD risk reduction       Relevant Orders   Lipid panel (Completed)   Need for shingles vaccine    1st dose provided      Relevant Orders   Zoster Recombinant (Shingrix ) (Completed)   Screening for diabetes mellitus   Relevant Orders   Hemoglobin A1c (Completed)   Screening for thyroid disorder   Relevant  Orders   TSH + free T4 (Completed)   Tobacco dependence    Chronic, remains stable Continue to recommend reduction with goal of cessation       Relevant Orders   Ambulatory Referral Lung Cancer Screening [place] [facility]   No follow-ups on file.    Algernon Huxley  Waynetta Pean, FNP, have reviewed all documentation for this visit. The documentation on 09/24/22 for the exam, diagnosis, procedures, and orders are all accurate and complete.  Gwyneth Sprout, [place] [phone] (phone) [phone] (fax)  [place]

## 2022-09-22 ENCOUNTER — Encounter: Payer: Self-pay | Admitting: Family Medicine

## 2022-09-22 ENCOUNTER — Ambulatory Visit: Payer: BC Managed Care – PPO | Admitting: Family Medicine

## 2022-09-22 VITALS — BP 145/80 | HR 75 | Temp 98.1°F | Ht 63.0 in | Wt 186.0 lb

## 2022-09-22 DIAGNOSIS — F17200 Nicotine dependence, unspecified, uncomplicated: Secondary | ICD-10-CM | POA: Diagnosis not present

## 2022-09-22 DIAGNOSIS — G8929 Other chronic pain: Secondary | ICD-10-CM

## 2022-09-22 DIAGNOSIS — M25561 Pain in right knee: Secondary | ICD-10-CM

## 2022-09-22 DIAGNOSIS — Z7689 Persons encountering health services in other specified circumstances: Secondary | ICD-10-CM | POA: Insufficient documentation

## 2022-09-22 DIAGNOSIS — M25562 Pain in left knee: Secondary | ICD-10-CM

## 2022-09-22 DIAGNOSIS — Z23 Encounter for immunization: Secondary | ICD-10-CM

## 2022-09-22 DIAGNOSIS — E782 Mixed hyperlipidemia: Secondary | ICD-10-CM | POA: Diagnosis not present

## 2022-09-22 DIAGNOSIS — I10 Essential (primary) hypertension: Secondary | ICD-10-CM | POA: Diagnosis not present

## 2022-09-22 DIAGNOSIS — S46812S Strain of other muscles, fascia and tendons at shoulder and upper arm level, left arm, sequela: Secondary | ICD-10-CM | POA: Diagnosis not present

## 2022-09-22 DIAGNOSIS — Z1329 Encounter for screening for other suspected endocrine disorder: Secondary | ICD-10-CM

## 2022-09-22 DIAGNOSIS — M79644 Pain in right finger(s): Secondary | ICD-10-CM

## 2022-09-22 DIAGNOSIS — J449 Chronic obstructive pulmonary disease, unspecified: Secondary | ICD-10-CM

## 2022-09-22 DIAGNOSIS — M79645 Pain in left finger(s): Secondary | ICD-10-CM

## 2022-09-22 DIAGNOSIS — J42 Unspecified chronic bronchitis: Secondary | ICD-10-CM

## 2022-09-22 DIAGNOSIS — Z131 Encounter for screening for diabetes mellitus: Secondary | ICD-10-CM | POA: Diagnosis not present

## 2022-09-22 MED ORDER — METHOCARBAMOL 750 MG PO TABS: 750.0000 mg | ORAL_TABLET | Freq: Three times a day (TID) | ORAL | 1 refills | Status: DC | PRN

## 2022-09-22 MED ORDER — ALBUTEROL SULFATE HFA 108 (90 BASE) MCG/ACT IN AERS: INHALATION_SPRAY | RESPIRATORY_TRACT | 11 refills | Status: DC

## 2022-09-22 MED ORDER — FLUTICASONE-SALMETEROL 100-50 MCG/ACT IN AEPB: 1.0000 | INHALATION_SPRAY | Freq: Two times a day (BID) | RESPIRATORY_TRACT | 11 refills | Status: DC

## 2022-09-22 MED ORDER — DULOXETINE HCL 30 MG PO CPEP: 30.0000 mg | ORAL_CAPSULE | Freq: Two times a day (BID) | ORAL | 3 refills | Status: DC

## 2022-09-22 MED ORDER — CELECOXIB 200 MG PO CAPS: 200.0000 mg | ORAL_CAPSULE | Freq: Two times a day (BID) | ORAL | 0 refills | Status: DC

## 2022-09-23 LAB — TSH+FREE T4
Free T4: 1.08 ng/dL (ref 0.82–1.77)
TSH: 1.25 u[IU]/mL (ref 0.450–4.500)

## 2022-09-23 LAB — CBC WITH DIFFERENTIAL/PLATELET
Basophils Absolute: 0.1 10*3/uL (ref 0.0–0.2)
Basos: 1 %
EOS (ABSOLUTE): 0.3 10*3/uL (ref 0.0–0.4)
Eos: 4 %
Hematocrit: 46.1 % (ref 34.0–46.6)
Hemoglobin: 15.3 g/dL (ref 11.1–15.9)
Immature Grans (Abs): 0 10*3/uL (ref 0.0–0.1)
Immature Granulocytes: 0 %
Lymphocytes Absolute: 2.6 10*3/uL (ref 0.7–3.1)
Lymphs: 30 %
MCH: 30.5 pg (ref 26.6–33.0)
MCHC: 33.2 g/dL (ref 31.5–35.7)
MCV: 92 fL (ref 79–97)
Monocytes Absolute: 0.7 10*3/uL (ref 0.1–0.9)
Monocytes: 8 %
Neutrophils Absolute: 5.1 10*3/uL (ref 1.4–7.0)
Neutrophils: 57 %
Platelets: 248 10*3/uL (ref 150–450)
RBC: 5.01 x10E6/uL (ref 3.77–5.28)
RDW: 12.9 % (ref 11.7–15.4)
WBC: 8.9 10*3/uL (ref 3.4–10.8)

## 2022-09-23 LAB — COMPREHENSIVE METABOLIC PANEL
ALT: 21 IU/L (ref 0–32)
AST: 20 IU/L (ref 0–40)
Albumin/Globulin Ratio: 1.4 (ref 1.2–2.2)
Albumin: 4.2 g/dL (ref 3.8–4.9)
Alkaline Phosphatase: 101 IU/L (ref 44–121)
BUN/Creatinine Ratio: 13 (ref 9–23)
BUN: 8 mg/dL (ref 6–24)
Bilirubin Total: <0.2 mg/dL (ref 0.0–1.2)
CO2: 25 mmol/L (ref 20–29)
Calcium: 9.3 mg/dL (ref 8.7–10.2)
Chloride: 103 mmol/L (ref 96–106)
Creatinine, Ser: 0.64 mg/dL (ref 0.57–1.00)
Globulin, Total: 3 g/dL (ref 1.5–4.5)
Glucose: 68 mg/dL — ABNORMAL LOW (ref 70–99)
Potassium: 4.4 mmol/L (ref 3.5–5.2)
Sodium: 143 mmol/L (ref 134–144)
Total Protein: 7.2 g/dL (ref 6.0–8.5)
eGFR: 102 mL/min/{1.73_m2} (ref >59)

## 2022-09-23 LAB — LIPID PANEL
Chol/HDL Ratio: 5.9 ratio — ABNORMAL HIGH (ref 0.0–4.4)
Cholesterol, Total: 214 mg/dL — ABNORMAL HIGH (ref 100–199)
HDL: 36 mg/dL — ABNORMAL LOW (ref >39)
LDL Chol Calc (NIH): 118 mg/dL — ABNORMAL HIGH (ref 0–99)
Triglycerides: 341 mg/dL — ABNORMAL HIGH (ref 0–149)
VLDL Cholesterol Cal: 60 mg/dL — ABNORMAL HIGH (ref 5–40)

## 2022-09-23 LAB — HEMOGLOBIN A1C
Est. average glucose Bld gHb Est-mCnc: 123 mg/dL
Hgb A1c MFr Bld: 5.9 % — ABNORMAL HIGH (ref 4.8–5.6)

## 2022-09-24 ENCOUNTER — Other Ambulatory Visit: Payer: Self-pay | Admitting: Family Medicine

## 2022-09-24 DIAGNOSIS — Z23 Encounter for immunization: Secondary | ICD-10-CM | POA: Insufficient documentation

## 2022-09-24 DIAGNOSIS — S46812S Strain of other muscles, fascia and tendons at shoulder and upper arm level, left arm, sequela: Secondary | ICD-10-CM | POA: Insufficient documentation

## 2022-09-24 DIAGNOSIS — Z1329 Encounter for screening for other suspected endocrine disorder: Secondary | ICD-10-CM | POA: Insufficient documentation

## 2022-09-24 MED ORDER — METFORMIN HCL ER 500 MG PO TB24
500.0000 mg | ORAL_TABLET | Freq: Every day | ORAL | 3 refills | Status: DC
Start: 2022-09-24 — End: 2024-02-19

## 2022-09-24 MED ORDER — ROSUVASTATIN CALCIUM 20 MG PO TABS: 20.0000 mg | ORAL_TABLET | Freq: Every day | ORAL | 3 refills | Status: DC

## 2022-09-24 MED ORDER — LOSARTAN POTASSIUM-HCTZ 100-25 MG PO TABS: 1.0000 | ORAL_TABLET | Freq: Every day | ORAL | 3 refills | Status: DC

## 2022-09-24 NOTE — Assessment & Plan Note (Signed)
1st dose provided °

## 2022-09-24 NOTE — Progress Notes (Signed)
Cholesterol is elevated -total is>200 -fats are elevated -good cholesterol (HDL) low -bad cholesterol (LDL) high Stroke/MI risk at 15%  The 10-year ASCVD risk score (Arnett DK, et al., 2019) is: 14.7%   Values used to calculate the score:     Age: 57 years     Sex: Female     Is Non-Hispanic African American: No     Diabetic: No     Tobacco smoker: Yes     Systolic Blood Pressure: [id number] mmHg     Is BP treated: Yes     HDL Cholesterol: 36 mg/dL     Total Cholesterol: 214 mg/dL  **Increase to Crestor 20 mg (discontinue pravastatin 40), Increase to Hyzaar 100-25 (double current dose), Start Metformin 500 XR (new)**  A1c shows pre-diabetes. Continue to recommend balanced, lower carb meals. Smaller meal size, adding snacks. Choosing water as drink of choice and increasing purposeful exercise.

## 2022-09-24 NOTE — Assessment & Plan Note (Signed)
Chronic, previously elevated Was previous prescribed pravastatin 40 mg Check LP Continue to recommend statins in addition to lifestyle to assist with ASCVD risk reduction

## 2022-09-24 NOTE — Assessment & Plan Note (Signed)
Acute on chronic, continue to recommend weight loss to normal BMI Trial of cymbalta to assist with referral to ortho

## 2022-09-24 NOTE — Assessment & Plan Note (Signed)
Chronic, elevated Recommend titration of Hyzaar from 50-12.5 to 100-25 to assist in ASCVD risk  Check labs Follow up 1-3 months with cymbalta titration

## 2022-09-24 NOTE — Assessment & Plan Note (Signed)
Chronic, remains stable Continue to recommend reduction with goal of cessation

## 2022-09-24 NOTE — Assessment & Plan Note (Signed)
Chronic, request for additional refills Encourage LDCTS to assist Continue to recommend smoking reduction with goal of cessation to assist Declines use of daily controller inhaler at this time

## 2022-09-24 NOTE — Assessment & Plan Note (Signed)
Acute on chronic, interfering with ADLS Trial of muscle relaxants and high dose anti-inflammatories to assist as well as stretching Referral to ortho to assist

## 2022-09-24 NOTE — Assessment & Plan Note (Signed)
Acute on chronic, likely overuse disorder; degenerative Recommend trial of conservative therapy and referral to ortho to assist

## 2022-10-28 ENCOUNTER — Emergency Department
Admission: EM | Admit: 2022-10-28 | Discharge: 2022-10-28 | Disposition: A | Discharge status: Home / Self Care | Payer: BC Managed Care – PPO | Attending: Emergency Medicine | Admitting: Emergency Medicine

## 2022-10-28 ENCOUNTER — Other Ambulatory Visit: Payer: Self-pay

## 2022-10-28 DIAGNOSIS — I10 Essential (primary) hypertension: Secondary | ICD-10-CM | POA: Insufficient documentation

## 2022-10-28 DIAGNOSIS — E669 Obesity, unspecified: Secondary | ICD-10-CM | POA: Insufficient documentation

## 2022-10-28 DIAGNOSIS — K0889 Other specified disorders of teeth and supporting structures: Secondary | ICD-10-CM | POA: Diagnosis not present

## 2022-10-28 DIAGNOSIS — J449 Chronic obstructive pulmonary disease, unspecified: Secondary | ICD-10-CM | POA: Diagnosis not present

## 2022-10-28 DIAGNOSIS — K047 Periapical abscess without sinus: Secondary | ICD-10-CM | POA: Diagnosis not present

## 2022-10-28 LAB — BASIC METABOLIC PANEL
Anion gap: 8 (ref 5–15)
BUN: 8 mg/dL (ref 6–20)
CO2: 29 mmol/L (ref 22–32)
Calcium: 9.4 mg/dL (ref 8.9–10.3)
Chloride: 102 mmol/L (ref 98–111)
Creatinine, Ser: 0.62 mg/dL (ref 0.44–1.00)
GFR, Estimated: >60 mL/min (ref >60)
Glucose, Bld: 96 mg/dL (ref 70–99)
Potassium: 4.2 mmol/L (ref 3.5–5.1)
Sodium: 139 mmol/L (ref 135–145)

## 2022-10-28 LAB — CBC WITH DIFFERENTIAL/PLATELET
Abs Immature Granulocytes: 0.03 10*3/uL (ref 0.00–0.07)
Basophils Absolute: 0.1 10*3/uL (ref 0.0–0.1)
Basophils Relative: 1 %
Eosinophils Absolute: 0.3 10*3/uL (ref 0.0–0.5)
Eosinophils Relative: 3 %
HCT: 47.6 % — ABNORMAL HIGH (ref 36.0–46.0)
Hemoglobin: 15.5 g/dL — ABNORMAL HIGH (ref 12.0–15.0)
Immature Granulocytes: 0 %
Lymphocytes Relative: 28 %
Lymphs Abs: 2.8 10*3/uL (ref 0.7–4.0)
MCH: 30.6 pg (ref 26.0–34.0)
MCHC: 32.6 g/dL (ref 30.0–36.0)
MCV: 93.9 fL (ref 80.0–100.0)
Monocytes Absolute: 0.7 10*3/uL (ref 0.1–1.0)
Monocytes Relative: 7 %
Neutro Abs: 6.1 10*3/uL (ref 1.7–7.7)
Neutrophils Relative %: 61 %
Platelets: 275 10*3/uL (ref 150–400)
RBC: 5.07 MIL/uL (ref 3.87–5.11)
RDW: 12.9 % (ref 11.5–15.5)
WBC: 10 10*3/uL (ref 4.0–10.5)
nRBC: 0 % (ref 0.0–0.2)

## 2022-10-28 MED ORDER — AMOXICILLIN-POT CLAVULANATE 875-125 MG PO TABS
1.0000 | ORAL_TABLET | Freq: Once | ORAL | Status: AC
  Administered 2022-10-28: 1 via ORAL
  Filled 2022-10-28: qty 1

## 2022-10-28 MED ORDER — AMOXICILLIN-POT CLAVULANATE 875-125 MG PO TABS: 1.0000 | ORAL_TABLET | Freq: Two times a day (BID) | ORAL | 0 refills | Status: AC

## 2022-10-28 NOTE — ED Notes (Signed)
Pt verbalizes understanding of discharge instructions. Opportunity for questioning and answers were provided. Pt discharged from ED home.

## 2022-10-28 NOTE — ED Triage Notes (Signed)
Pt to ED for R facial swelling since yesterday after starting with dental pain on Thursday. R face appears slightly swollen compared to L. Denies pain to upper jaw where tooth infx was, states pain is more in upper face. Current smoker.

## 2022-10-28 NOTE — Discharge Instructions (Addendum)
OPTIONS FOR DENTAL FOLLOW UP CARE ° °[place] Department of Health and Human Services - Local Safety Net Dental Clinics °http://www.ncdhhs.gov/dph/oralhealth/services/safetynetclinics.htm °  °[facility] (336-562-3123) ° °[facility] (919-933-9087) ° °[facility] ([phone] ext [phone]) ° °[facility] ([phone]) ° °[facility] ([phone]) °This clinic caters to the indigent population and is on a lottery system. °Location: °[facility], [address], [place] °Clinic Hours: °Wednesdays from 6pm - 9pm, patients seen by a lottery system. °For dates, call or go to www.med.[facility].edu/[facility]/patients/Dental-[facility] °Services: °Cleanings, fillings and simple extractions. °Payment Options: °DENTAL WORK IS FREE OF CHARGE. Bring proof of income or support. °Best way to get seen: °Arrive at 5:15 pm - this is a lottery, NOT first come/first serve, so arriving earlier will not increase your chances of being seen. °  °  °[facility] °[phone] °Select option 1 for emergencies °  °Location: °[facility], [address], [place] °Clinic Hours: °No walk-ins accepted - call the day before to schedule an appointment. °Check in times are 9:30 am and 1:30 pm. °Services: °Simple extractions, temporary fillings, pulpectomy/pulp debridement, uncomplicated abscess drainage. °Payment Options: °PAYMENT IS DUE AT THE TIME OF SERVICE.  Fee is usually $100-200, additional surgical procedures (e.g. abscess drainage) may be extra. °Cash, checks, Visa/MasterCard accepted.  Can file Medicaid if patient is covered for dental - patient should call case worker to check. °No discount for [facility] patients. °Best way to get seen: °MUST call the day before and get onto the schedule. Can usually be seen the next 1-2 days. No walk-ins accepted. °  °  °[facility] °[phone] °   °Location: °[facility], [address], [place] °Clinic Hours: °M, W, Th, F 8am or 1:30pm, Tues 9a or 1:30 - first come/first served. °Services: °Simple extractions, temporary fillings, uncomplicated abscess drainage.  You do not need to be an [place] resident. °Payment Options: °PAYMENT IS DUE AT THE TIME OF SERVICE. °Dental insurance, otherwise sliding scale - bring proof of income or support. °Depending on income and treatment needed, cost is usually $50-200. °Best way to get seen: °Arrive early as it is first come/first served. °  °  °[facility] °[phone] °  °Location: °[address] °Clinic Hours: °Mon-Thu 8a-5p °Services: °Most basic dental services including extractions and fillings. °Payment Options: °PAYMENT IS DUE AT THE TIME OF SERVICE. °Sliding scale, up to 50% off - bring proof if income or support. °Medicaid with dental option accepted. °Best way to get seen: °Call to schedule an appointment, can usually be seen within 2 weeks OR they will try to see walk-ins - show up at 8a or 2p (you may have to wait). °  °  °[facility] °[phone] °[place] RESIDENTS ONLY °  °Location: °[facility], [address], [place], [place] [address] °Clinic Hours: By appointment only. °Monday - Thursday 8am-5pm, Friday 8am-12pm °Services: Cleanings, fillings, extractions. °Payment Options: °PAYMENT IS DUE AT THE TIME OF SERVICE. °Cash, Visa or MasterCard. Sliding scale - $30 minimum per service. °Best way to get seen: °Come in to office, complete packet and make an appointment - need proof of income °or support monies for each household member and proof of [place] residence. °Usually takes about a month to get in. °  °  °[facility] °[phone] °  °Location: °[address]   [place] °Clinic Hours: Walk-in Urgent Care Dental Services are offered Monday-Friday  mornings only. °The numbers of emergencies accepted daily is limited to the number of °providers available. °Maximum 15 - Mondays, Wednesdays & Thursdays °Maximum 10 - Tuesdays & Fridays °Services: °You do not need to be a [place] resident to be seen for a dental emergency. °Emergencies are defined as pain, swelling, abnormal bleeding, or dental trauma. Walkins will receive x-rays if needed. °NOTE: Dental cleaning is not an emergency. °Payment Options: °PAYMENT IS DUE AT THE TIME OF SERVICE. °Minimum co-pay is $40.00 for uninsured patients. °Minimum co-pay is $3.00 for Medicaid with dental coverage. °Dental Insurance is accepted and must be presented at time of visit. °Medicare does not cover dental. °Forms of payment: Cash, credit card, checks. °Best way to get seen: °If not previously registered with the clinic, walk-in dental registration begins at 7:15 am and is on a first come/first serve basis. °If previously registered with the clinic, call to make an appointment. °  °  °[facility] °[phone] °[place] RESIDENTS ONLY °  °Location: °[address], [place], [place] °Clinic Hours: °Mon-Thu 10a-2p °Services: Extractions only! °Payment Options: °FREE (donations accepted) - bring proof of income or support °Best way to get seen: °Call and schedule an appointment OR come at 8am on the 1st Monday of every month (except for holidays) when it is first come/first served. °  °  °[facility] °[phone] °  °Location: °[address], [place] °Clinic Hours: °Friday mornings °Services, Payment Options, Best way to get seen: °Call for info °

## 2022-10-28 NOTE — ED Provider Notes (Signed)
[facility] Emergency Department Provider Note     Event Date/Time   First MD Initiated Contact with Patient 10/28/22 1958     (approximate)   History   Facial Swelling and Dental Pain   HPI  Allison Brennan is a 57 y.o. female with a history of hypertension, obesity, and COPD, presents to the ED for evaluation of right wrist swelling patient presents with swelling yesterday with associated dental pain on Thursday.  No reports of any fevers, chills, sweats patient denies any spontaneous purulent drainage difficulty breathing, swallowing, or controlling oral secretions.     Physical Exam   Triage Vital Signs: ED Triage Vitals  Enc Vitals Group     BP 10/28/22 1822 (!) 138/96     Pulse Rate 10/28/22 1822 85     Resp 10/28/22 1822 16     Temp 10/28/22 1822 98.3 F (36.8 C)     Temp src --      SpO2 10/28/22 1822 95 %     Weight 10/28/22 1822 182 lb (82.6 kg)     Height 10/28/22 1822 5\' 3"  (1.6 m)     Head Circumference --      Peak Flow --      Pain Score 10/28/22 1830 7     Pain Loc --      Pain Edu? --      Excl. in GC? --     Most recent vital signs: Vitals:   10/28/22 1822  BP: (!) 138/96  Pulse: 85  Resp: 16  Temp: 98.3 F (36.8 C)  SpO2: 95%    General Awake, no distress. *** {**HEENT NCAT. PERRL. EOMI. No rhinorrhea. Mucous membranes are moist. **} CV:  Good peripheral perfusion. *** RESP:  Normal effort. *** ABD:  No distention. *** {**Other: **}   ED Results / Procedures / Treatments   Labs (all labs ordered are listed, but only abnormal results are displayed) Labs Reviewed  CBC WITH DIFFERENTIAL/PLATELET - Abnormal; Notable for the following components:      Result Value   Hemoglobin 15.5 (*)    HCT 47.6 (*)    All other components within normal limits  BASIC METABOLIC PANEL     EKG  ***  RADIOLOGY  {**I personally viewed and evaluated these images as part of my medical decision making, as well as  reviewing the written report by the radiologist.  ED Provider Interpretation: ***  No results found.   PROCEDURES:  Critical Care performed: {CriticalCareYesNo:19197::"Yes, see critical care procedure note(s)","No"}  Procedures   MEDICATIONS ORDERED IN ED: Medications - No data to display   IMPRESSION / MDM / ASSESSMENT AND PLAN / ED COURSE  I reviewed the triage vital signs and the nursing notes.                              Differential diagnosis includes, but is not limited to, ***  Patient's presentation is most consistent with acute, uncomplicated illness.  Patient's diagnosis is consistent with ***. Patient will be discharged home with prescriptions for ***. Patient is to follow up with *** as needed or otherwise directed. Patient is given ED precautions to return to the ED for any worsening or new symptoms.     FINAL CLINICAL IMPRESSION(S) / ED DIAGNOSES   Final diagnoses:  None     Rx / DC Orders   ED Discharge Orders  None        Note:  This document was prepared using Dragon voice recognition software and may include unintentional dictation errors.

## 2022-11-07 ENCOUNTER — Ambulatory Visit: Payer: BC Managed Care – PPO | Admitting: Family Medicine

## 2022-11-17 ENCOUNTER — Ambulatory Visit: Payer: BC Managed Care – PPO | Admitting: Family Medicine

## 2023-01-12 ENCOUNTER — Ambulatory Visit: Payer: BC Managed Care – PPO | Admitting: Family Medicine

## 2023-01-12 ENCOUNTER — Encounter: Payer: Self-pay | Admitting: Family Medicine

## 2023-01-12 VITALS — BP 168/94 | HR 67 | Ht 63.0 in | Wt 181.2 lb

## 2023-01-12 DIAGNOSIS — J449 Chronic obstructive pulmonary disease, unspecified: Secondary | ICD-10-CM

## 2023-01-12 DIAGNOSIS — I10 Essential (primary) hypertension: Secondary | ICD-10-CM

## 2023-01-12 DIAGNOSIS — F17200 Nicotine dependence, unspecified, uncomplicated: Secondary | ICD-10-CM

## 2023-01-12 DIAGNOSIS — Z23 Encounter for immunization: Secondary | ICD-10-CM

## 2023-01-12 MED ORDER — VALSARTAN 160 MG PO TABS: 160.0000 mg | ORAL_TABLET | Freq: Every day | ORAL | 0 refills | Status: DC

## 2023-01-12 NOTE — Assessment & Plan Note (Signed)
Chronic; unchanged Reports 1.5 ppd/>40 years

## 2023-01-12 NOTE — Assessment & Plan Note (Signed)
Chronic; reports ongoing cough likely s/s to anti-hypertensive; however, pt has been on medication for >10 years prior to coming to this practice Encourage smoking reduction with goal of cessation Continue to monitor need for further referrals Remains on advair and prn albuterol

## 2023-01-12 NOTE — Assessment & Plan Note (Signed)
Chronic; elevated Will trial diovan without hydrochlorothiazide given complaints of mouth sores of unknown origin  1 month follow up Goal 129/79 given ASCVD

## 2023-01-12 NOTE — Progress Notes (Signed)
Established patient visit   Patient: Allison Brennan   DOB: 12/24/1965   57 y.o. Female  MRN: [id number] Visit Date: 01/12/2023  Today's healthcare provider: Jacky Kindle, FNP  Introduced to nurse practitioner role and practice setting.  All questions answered.  Discussed provider/patient relationship and expectations.   Chief Complaint  Patient presents with   Medication Problem    Patient reports the blood pressure medicine has 2 side effects and she can't take it. Patient was advised to take losartan hydrochlorothiazide 100-25. Reports it caused sores in her mouth as well as a cough. She reports she stopped taking medication on Monday.    Subjective    HPI HPI     Medication Problem    Additional comments: Patient reports the blood pressure medicine has 2 side effects and she can't take it. Patient was advised to take losartan hydrochlorothiazide 100-25. Reports it caused sores in her mouth as well as a cough. She reports she stopped taking medication on Monday.       Last edited by Acey Lav, CMA on 01/12/2023  2:06 PM.      Medications: Outpatient Medications Prior to Visit  Medication Sig   albuterol (VENTOLIN HFA) 108 (90 Base) MCG/ACT inhaler INHALE 2 PUFFS INTO LUNGS EVERY 4 HOURS AS NEEDED FOR WHEEZING OR  SHORTNESS  OF  BREATH   celecoxib (CELEBREX) 200 MG capsule Take 1 capsule (200 mg total) by mouth 2 (two) times daily.   fluticasone-salmeterol (ADVAIR DISKUS) 100-50 MCG/ACT AEPB Inhale 1 puff into the lungs 2 (two) times daily.   gabapentin (NEURONTIN) 100 MG capsule Take 1 capsule (100 mg total) by mouth 2 (two) times daily.   metFORMIN (GLUCOPHAGE-XR) 500 MG 24 hr tablet Take 1 tablet (500 mg total) by mouth daily with breakfast.   methocarbamol (ROBAXIN-750) 750 MG tablet Take 1 tablet (750 mg total) by mouth every 8 (eight) hours as needed for muscle spasms.   rosuvastatin (CRESTOR) 20 MG tablet Take 1 tablet (20 mg total) by mouth daily.    DULoxetine (CYMBALTA) 30 MG capsule Take 1 capsule (30 mg total) by mouth 2 (two) times daily. (Patient not taking: Reported on 01/12/2023)   [DISCONTINUED] losartan-hydrochlorothiazide (HYZAAR) 100-25 MG tablet Take 1 tablet by mouth daily. (Patient not taking: Reported on 01/12/2023)   No facility-administered medications prior to visit.    Review of Systems    Objective    BP (!) 168/94 (BP Location: Right Arm, Patient Position: Sitting, Cuff Size: Normal)   Pulse 67   Ht 5\' 3"  (1.6 m)   Wt 181 lb 3.2 oz (82.2 kg)   SpO2 97%   BMI 32.10 kg/m   Physical Exam Vitals and nursing note reviewed.  Constitutional:      General: She is not in acute distress.    Appearance: Normal appearance. She is obese. She is not ill-appearing, toxic-appearing or diaphoretic.  HENT:     Head: Normocephalic and atraumatic.     Mouth/Throat:     Comments: Reports hx of oral sores; thought to be s/s to anti-hypertensive. Reports sores have relieved since stopping medication 4 days ago  Cardiovascular:     Rate and Rhythm: Normal rate and regular rhythm.     Pulses: Normal pulses.     Heart sounds: Normal heart sounds. No murmur heard.    No friction rub. No gallop.  Pulmonary:     Effort: Pulmonary effort is normal. No respiratory distress.  Breath sounds: Normal breath sounds. No stridor. No wheezing, rhonchi or rales.  Chest:     Chest wall: No tenderness.  Musculoskeletal:        General: No swelling, tenderness, deformity or signs of injury. Normal range of motion.     Right lower leg: No edema.     Left lower leg: No edema.  Skin:    General: Skin is warm and dry.     Capillary Refill: Capillary refill takes less than 2 seconds.     Coloration: Skin is not jaundiced or pale.     Findings: No bruising, erythema, lesion or rash.  Neurological:     General: No focal deficit present.     Mental Status: She is alert and oriented to person, place, and time. Mental status is at baseline.      Cranial Nerves: No cranial nerve deficit.     Sensory: No sensory deficit.     Motor: No weakness.     Coordination: Coordination normal.  Psychiatric:        Mood and Affect: Mood normal.        Behavior: Behavior normal.        Thought Content: Thought content normal.        Judgment: Judgment normal.     No results found for any visits on 01/12/23.  Assessment & Plan     Problem List Items Addressed This Visit       Cardiovascular and Mediastinum   Essential hypertension - Primary    Chronic; elevated Will trial diovan without hydrochlorothiazide given complaints of mouth sores of unknown origin  1 month follow up Goal 129/79 given ASCVD       Relevant Medications   valsartan (DIOVAN) 160 MG tablet     Respiratory   Chronic obstructive pulmonary disease (HCC)    Chronic; reports ongoing cough likely s/s to anti-hypertensive; however, pt has been on medication for >10 years prior to coming to this practice Encourage smoking reduction with goal of cessation Continue to monitor need for further referrals Remains on advair and prn albuterol         Other   Immunization due   Relevant Orders   Zoster Recombinant (Shingrix )   Need for shingles vaccine   Relevant Orders   Zoster Recombinant (Shingrix )   Tobacco dependence    Chronic; unchanged Reports 1.5 ppd/>40 years      Return in about 4 weeks (around 02/09/2023) for blood pressure check, HTN management.     Leilani Merl, FNP, have reviewed all documentation for this visit. The documentation on 01/12/23 for the exam, diagnosis, procedures, and orders are all accurate and complete.  Jacky Kindle, FNP  [facility] [phone] (phone) [phone] (fax)  [facility]

## 2023-01-26 ENCOUNTER — Encounter: Payer: Self-pay | Admitting: Emergency Medicine

## 2023-02-16 ENCOUNTER — Ambulatory Visit: Payer: BC Managed Care – PPO | Admitting: Family Medicine

## 2023-02-16 ENCOUNTER — Encounter: Payer: Self-pay | Admitting: Family Medicine

## 2023-02-16 VITALS — BP 140/80 | HR 73 | Ht 63.25 in | Wt 178.9 lb

## 2023-02-16 DIAGNOSIS — I10 Essential (primary) hypertension: Secondary | ICD-10-CM | POA: Diagnosis not present

## 2023-02-16 DIAGNOSIS — M25552 Pain in left hip: Secondary | ICD-10-CM | POA: Diagnosis not present

## 2023-02-16 MED ORDER — VALSARTAN 320 MG PO TABS: 320.0000 mg | ORAL_TABLET | Freq: Every day | ORAL | 3 refills | Status: DC

## 2023-02-16 MED ORDER — METHOCARBAMOL 750 MG PO TABS: 750.0000 mg | ORAL_TABLET | Freq: Three times a day (TID) | ORAL | 1 refills | Status: DC | PRN

## 2023-02-16 NOTE — Assessment & Plan Note (Addendum)
Chronic, improved Titrate diovan from 160 to 320 to assist in meeting goal of 129/79 with known ASCVD and pre-diabetes Current ASCVD is The 10-year ASCVD risk score (Arnett DK, et al., 2019) is: 13.7%

## 2023-02-16 NOTE — Assessment & Plan Note (Signed)
Refill of robaxin to assist and additional exercises provided for stretching/rehab F/u as needed

## 2023-02-16 NOTE — Progress Notes (Signed)
Established patient visit   Patient: Allison Brennan   DOB: 1965-12-05   57 y.o. Female  MRN: [id number] Visit Date: 02/16/2023  Today's healthcare provider: Jacky Kindle, FNP  Introduced to nurse practitioner role and practice setting.  All questions answered.  Discussed provider/patient relationship and expectations.  Subjective    HPI HPI     Medical Management of Chronic Issues    Additional comments: 1 month follow on hypertension       Last edited by Rolly Salter, CMA on 02/16/2023  3:31 PM.      Medications: Outpatient Medications Prior to Visit  Medication Sig   albuterol (VENTOLIN HFA) 108 (90 Base) MCG/ACT inhaler INHALE 2 PUFFS INTO LUNGS EVERY 4 HOURS AS NEEDED FOR WHEEZING OR  SHORTNESS  OF  BREATH   celecoxib (CELEBREX) 200 MG capsule Take 1 capsule (200 mg total) by mouth 2 (two) times daily.   DULoxetine (CYMBALTA) 30 MG capsule Take 1 capsule (30 mg total) by mouth 2 (two) times daily.   fluticasone-salmeterol (ADVAIR DISKUS) 100-50 MCG/ACT AEPB Inhale 1 puff into the lungs 2 (two) times daily.   gabapentin (NEURONTIN) 100 MG capsule Take 1 capsule (100 mg total) by mouth 2 (two) times daily.   metFORMIN (GLUCOPHAGE-XR) 500 MG 24 hr tablet Take 1 tablet (500 mg total) by mouth daily with breakfast.   rosuvastatin (CRESTOR) 20 MG tablet Take 1 tablet (20 mg total) by mouth daily.   [DISCONTINUED] methocarbamol (ROBAXIN-750) 750 MG tablet Take 1 tablet (750 mg total) by mouth every 8 (eight) hours as needed for muscle spasms.   [DISCONTINUED] valsartan (DIOVAN) 160 MG tablet Take 1 tablet (160 mg total) by mouth daily.   No facility-administered medications prior to visit.     Objective    BP (!) 140/80   Pulse 73   Ht 5' 3.25" (1.607 m)   Wt 178 lb 14.4 oz (81.1 kg)   SpO2 97%   BMI 31.44 kg/m   Physical Exam Vitals and nursing note reviewed.  Constitutional:      General: She is not in acute distress.    Appearance: Normal appearance. She is  obese. She is not ill-appearing, toxic-appearing or diaphoretic.  HENT:     Head: Normocephalic and atraumatic.  Cardiovascular:     Rate and Rhythm: Normal rate and regular rhythm.     Pulses: Normal pulses.     Heart sounds: Normal heart sounds. No murmur heard.    No friction rub. No gallop.  Pulmonary:     Effort: Pulmonary effort is normal. No respiratory distress.     Breath sounds: Normal breath sounds. No stridor. No wheezing, rhonchi or rales.  Chest:     Chest wall: No tenderness.  Musculoskeletal:        General: Tenderness present. No swelling, deformity or signs of injury. Normal range of motion.     Right lower leg: No edema.     Left lower leg: No edema.  Skin:    General: Skin is warm and dry.     Capillary Refill: Capillary refill takes less than 2 seconds.     Coloration: Skin is not jaundiced or pale.     Findings: No bruising, erythema, lesion or rash.  Neurological:     General: No focal deficit present.     Mental Status: She is alert and oriented to person, place, and time. Mental status is at baseline.     Cranial Nerves: No cranial nerve  deficit.     Sensory: No sensory deficit.     Motor: No weakness.     Coordination: Coordination normal.  Psychiatric:        Mood and Affect: Mood normal.        Behavior: Behavior normal.        Thought Content: Thought content normal.        Judgment: Judgment normal.     No results found for any visits on 02/16/23.  Assessment & Plan     Problem List Items Addressed This Visit       Cardiovascular and Mediastinum   Essential hypertension - Primary    Chronic, improved Titrate diovan from 160 to 320 to assist in meeting goal of 129/79 with known ASCVD and pre-diabetes Current ASCVD is The 10-year ASCVD risk score (Arnett DK, et al., 2019) is: 13.7%       Relevant Medications   valsartan (DIOVAN) 320 MG tablet     Other   Acute hip pain, left    Refill of robaxin to assist and additional exercises  provided for stretching/rehab F/u as needed      Relevant Medications   methocarbamol (ROBAXIN-750) 750 MG tablet   Return in about 4 weeks (around 03/16/2023), or if symptoms worsen or fail to improve, for chonic disease management.     Leilani Merl, FNP, have reviewed all documentation for this visit. The documentation on 02/16/23 for the exam, diagnosis, procedures, and orders are all accurate and complete.  Jacky Kindle, FNP  [facility] [phone] (phone) [phone] (fax)  [facility]

## 2023-03-16 ENCOUNTER — Ambulatory Visit (INDEPENDENT_AMBULATORY_CARE_PROVIDER_SITE_OTHER): Payer: BC Managed Care – PPO | Admitting: Family Medicine

## 2023-03-16 DIAGNOSIS — Z91199 Patient's noncompliance with other medical treatment and regimen due to unspecified reason: Secondary | ICD-10-CM

## 2023-03-18 NOTE — Progress Notes (Signed)
Patient was not seen for appt d/t no call, no show, or late arrival >10 mins past appt time.   Jacky Kindle, FNP  [facility] [address] [place], [place] [address] [phone] (phone) [phone] (fax) [facility]

## 2023-04-12 ENCOUNTER — Ambulatory Visit: Payer: Self-pay

## 2023-04-12 NOTE — Telephone Encounter (Signed)
  Chief Complaint: Swollen rt leg following a fall when leg was injured.  Symptoms: Pain swelling bruising.  Frequency: Sept 29th Pertinent Negatives: Patient denies fever, SOB, chest pain Disposition: [] ED /[] Urgent Care (no appt availability in office) / [x] Appointment(In office/virtual)/ []  [facility]/ [] Home Care/ [] Refused Recommended Disposition /[] [facility]/ []  Follow-up with PCP Additional Notes: Pt slipped and fell on kitchen floor  that was wet and had bug spray on it making it very slippery. Pt reports that her leg was kind of underneath of her. Pt states that swelling will not go down. She has elevated leg. Leg is swollen from knee down. Pt states there is still bruising below ankle to foot.  Appt made for tomorrow.     Summary: Swelling in right leg   Pt is calling in because she fell on 04/01/23 and her right leg is still swollen. Pt says the leg is still sore and she cannot get the swelling to go down. Pt says the right side of her ankle was bruised and that went down but the swelling has remained.     Reason for Disposition  [1] MODERATE leg swelling (e.g., swelling extends up to knees) AND [2] new-onset or worsening  Answer Assessment - Initial Assessment Questions 1. ONSET: "When did the swelling start?" (e.g., minutes, hours, days)     Sept 29th 2. LOCATION: "What part of the leg is swollen?"  "Are both legs swollen or just one leg?"     Rt leg and knee 3. SEVERITY: "How bad is the swelling?" (e.g., localized; mild, moderate, severe)   - Localized: Small area of swelling localized to one leg.   - MILD pedal edema: Swelling limited to foot and ankle, pitting edema < 1/4 inch (6 mm) deep, rest and elevation eliminate most or all swelling.   - MODERATE edema: Swelling of lower leg to knee, pitting edema > 1/4 inch (6 mm) deep, rest and elevation only partially reduce swelling.   - SEVERE edema: Swelling extends above knee, facial or hand swelling  present.      moderate 4. REDNESS: "Does the swelling look red or infected?"     no 5. PAIN: "Is the swelling painful to touch?" If Yes, ask: "How painful is it?"   (Scale 1-10; mild, moderate or severe)     Pain - 8/10 6. FEVER: "Do you have a fever?" If Yes, ask: "What is it, how was it measured, and when did it start?"      no 7. CAUSE: "What do you think is causing the leg swelling?"     Fall right ankle 8. MEDICAL HISTORY: "Do you have a history of blood clots (e.g., DVT), cancer, heart failure, kidney disease, or liver failure?"     no 9. RECURRENT SYMPTOM: "Have you had leg swelling before?" If Yes, ask: "When was the last time?" "What happened that time?"     Rt knee has Arthritis 10. OTHER SYMPTOMS: "Do you have any other symptoms?" (e.g., chest pain, difficulty breathing)       no  Protocols used: Leg Swelling and Edema-A-AH

## 2023-04-13 ENCOUNTER — Ambulatory Visit (INDEPENDENT_AMBULATORY_CARE_PROVIDER_SITE_OTHER): Payer: BC Managed Care – PPO | Admitting: Physician Assistant

## 2023-04-13 DIAGNOSIS — Z91199 Patient's noncompliance with other medical treatment and regimen due to unspecified reason: Secondary | ICD-10-CM

## 2023-04-13 NOTE — Progress Notes (Signed)
Patient was not seen for appt d/t no call, no show, or late arrival >10 mins past appt time.    Debera Lat PA [facility] [address] [place], [place] [address] [phone] (phone) [phone] (fax) [facility]

## 2023-06-28 ENCOUNTER — Ambulatory Visit: Payer: Self-pay

## 2023-06-28 ENCOUNTER — Telehealth: Payer: BC Managed Care – PPO | Admitting: Family Medicine

## 2023-06-28 ENCOUNTER — Encounter: Payer: Self-pay | Admitting: Family Medicine

## 2023-06-28 DIAGNOSIS — J019 Acute sinusitis, unspecified: Secondary | ICD-10-CM | POA: Diagnosis not present

## 2023-06-28 DIAGNOSIS — J441 Chronic obstructive pulmonary disease with (acute) exacerbation: Secondary | ICD-10-CM

## 2023-06-28 DIAGNOSIS — R051 Acute cough: Secondary | ICD-10-CM | POA: Diagnosis not present

## 2023-06-28 MED ORDER — HYDROCOD POLI-CHLORPHE POLI ER 10-8 MG/5ML PO SUER: 2.5000 mL | Freq: Two times a day (BID) | ORAL | 0 refills | Status: DC | PRN

## 2023-06-28 MED ORDER — AZELASTINE HCL 0.1 % NA SOLN: 2.0000 | Freq: Two times a day (BID) | NASAL | 12 refills | Status: DC | PRN

## 2023-06-28 MED ORDER — GUAIFENESIN 200 MG PO TABS: 200.0000 mg | ORAL_TABLET | ORAL | 0 refills | Status: DC | PRN

## 2023-06-28 MED ORDER — PREDNISONE 20 MG PO TABS: 20.0000 mg | ORAL_TABLET | Freq: Two times a day (BID) | ORAL | 0 refills | Status: DC

## 2023-06-28 MED ORDER — AMOXICILLIN-POT CLAVULANATE 875-125 MG PO TABS: 1.0000 | ORAL_TABLET | Freq: Two times a day (BID) | ORAL | 0 refills | Status: DC

## 2023-06-28 NOTE — Telephone Encounter (Signed)
Chief Complaint: Wheezing  Symptoms: Cough, congestion, headache, sore throat runny nose Frequency: comes and goes Pertinent Negatives: Patient denies fever, nausea, vomiting, chest pain Disposition: [] ED /[] Urgent Care (no appt availability in office) / [x] Appointment(In office/virtual)/ []  [facility]/ [] Home Care/ [] Refused Recommended Disposition /[] [facility]/ []  Follow-up with PCP Additional Notes: Patient states she has had a cough, runny nose, sore throat and congestion for almost 2 weeks now. Patient reports wheezing as a new symptoms. Patient states the wheezing is mild but the other symptoms are not getting better with OTC treatment. Care advice was given and patient was scheduled for a MyChart video visit today at 1540.   Reason for Disposition  [1] Longstanding difficulty breathing AND [2] not responding to usual therapy  Answer Assessment - Initial Assessment Questions 1. RESPIRATORY STATUS: "Describe your breathing?" (e.g., wheezing, shortness of breath, unable to speak, severe coughing)      Wheezing 2. ONSET: "When did this breathing problem begin?"      2 weeks ago  3. PATTERN "Does the difficult breathing come and go, or has it been constant since it started?"      Comes and goes  4. SEVERITY: "How bad is your breathing?" (e.g., mild, moderate, severe)    - MILD: No SOB at rest, mild SOB with walking, speaks normally in sentences, can lie down, no retractions, pulse < 100.    - MODERATE: SOB at rest, SOB with minimal exertion and prefers to sit, cannot lie down flat, speaks in phrases, mild retractions, audible wheezing, pulse 100-120.    - SEVERE: Very SOB at rest, speaks in single words, struggling to breathe, sitting hunched forward, retractions, pulse > 120      Mild  5. RECURRENT SYMPTOM: "Have you had difficulty breathing before?" If Yes, ask: "When was the last time?" and "What happened that time?"      Yes, maybe one time they gave me a  breathing treatment  6. CARDIAC HISTORY: "Do you have any history of heart disease?" (e.g., heart attack, angina, bypass surgery, angioplasty)      Hypertension  7. LUNG HISTORY: "Do you have any history of lung disease?"  (e.g., pulmonary embolus, asthma, emphysema)     COPD  8. CAUSE: "What do you think is causing the breathing problem?"      I don't know  9. OTHER SYMPTOMS: "Do you have any other symptoms? (e.g., dizziness, runny nose, cough, chest pain, fever)     Cough, congestion, sore throat, headache, runny nose, coughing up mucus  12. TRAVEL: "Have you traveled out of the country in the last month?" (e.g., travel history, exposures)       No  Protocols used: Breathing Difficulty-A-AH

## 2023-06-28 NOTE — Assessment & Plan Note (Addendum)
Given time course and symptoms, along with co-morbidities - appears to have developed sinus infection.  Allergies reviewed. - Augmentin BID for 10 days.  - Mucinex cough expectorant - azelastine for nasal congestion - recommend nasal saline spray for congestion as well - may use chloraseptic spray for throat irritation - Please f/u if symptoms worsen of persist.

## 2023-06-28 NOTE — Progress Notes (Signed)
Virtual Visit via Video Note  I connected with Allison Brennan on 06/28/23 at  3:40 PM EST by a video enabled telemedicine application and verified that I am speaking with the correct person using two identifiers. Attempted video visit 4 times, on two different computers due to audio difficulties was unable to use video. Pt agreed to finish visit through telephone for plan of care understanding and decision making, name and date of birth used via telephone to confirm pt identity.  Patient Location: Home Provider Location: [facility]  I discussed the limitations, risks, security, and privacy concerns of performing an evaluation and management service by video and the availability of in person appointments. I also discussed with the patient that there may be a patient responsible charge related to this service. The patient expressed understanding and agreed to proceed.  Subjective: PCP: Jacky Kindle, FNP  Two weeks of congestion, cough, and worsening COPD wheezing. Productive cough, keeping pt up at night and causing rib pain. Has sore throat, and states its red. She has pressure pain in R cheekbone and L eyebrow when she presses sinuses, with frontal pressure headache. She is taking her daily inhaler and as needed inhaler, and using OTC medications Nyquil and alka seltzer, but continues to cough, congestion hasn't improved.   Denies fevers, palpitations chest pain/tightness, dizziness, confusion, nausea, vomiting, diarrhea, ear pain, or eye pain. No urinary or GI issues. Home COVID negative last week. She is able to eat and drink normally.    ROS: Per HPI  Current Outpatient Medications:    amoxicillin-clavulanate (AUGMENTIN) 875-125 MG tablet, Take 1 tablet by mouth 2 (two) times daily., Disp: 20 tablet, Rfl: 0   azelastine (ASTELIN) 0.1 % nasal spray, Place 2 sprays into both nostrils 2 (two) times daily as needed for rhinitis. Use in each nostril as directed, Disp: 30 mL, Rfl: 12    chlorpheniramine-HYDROcodone (TUSSIONEX) 10-8 MG/5ML, Take 2.5 mLs by mouth every 12 (twelve) hours as needed for cough., Disp: 70 mL, Rfl: 0   guaiFENesin 200 MG tablet, Take 1 tablet (200 mg total) by mouth every 4 (four) hours as needed for cough or to loosen phlegm., Disp: 30 suppository, Rfl: 0   predniSONE (DELTASONE) 20 MG tablet, Take 1 tablet (20 mg total) by mouth in the morning and at bedtime., Disp: 10 tablet, Rfl: 0   albuterol (VENTOLIN HFA) 108 (90 Base) MCG/ACT inhaler, INHALE 2 PUFFS INTO LUNGS EVERY 4 HOURS AS NEEDED FOR WHEEZING OR  SHORTNESS  OF  BREATH, Disp: 18 g, Rfl: 11   celecoxib (CELEBREX) 200 MG capsule, Take 1 capsule (200 mg total) by mouth 2 (two) times daily., Disp: 60 capsule, Rfl: 0   DULoxetine (CYMBALTA) 30 MG capsule, Take 1 capsule (30 mg total) by mouth 2 (two) times daily., Disp: 60 capsule, Rfl: 3   fluticasone-salmeterol (ADVAIR DISKUS) 100-50 MCG/ACT AEPB, Inhale 1 puff into the lungs 2 (two) times daily., Disp: 60 each, Rfl: 11   gabapentin (NEURONTIN) 100 MG capsule, Take 1 capsule (100 mg total) by mouth 2 (two) times daily., Disp: 90 capsule, Rfl: 3   metFORMIN (GLUCOPHAGE-XR) 500 MG 24 hr tablet, Take 1 tablet (500 mg total) by mouth daily with breakfast., Disp: 90 tablet, Rfl: 3   methocarbamol (ROBAXIN-750) 750 MG tablet, Take 1 tablet (750 mg total) by mouth every 8 (eight) hours as needed for muscle spasms., Disp: 30 tablet, Rfl: 1   rosuvastatin (CRESTOR) 20 MG tablet, Take 1 tablet (20 mg total)  by mouth daily., Disp: 90 tablet, Rfl: 3   valsartan (DIOVAN) 320 MG tablet, Take 1 tablet (320 mg total) by mouth daily., Disp: 90 tablet, Rfl: 3  Observations/Objective: There were no vitals filed for this visit. Due to telemed. Physical Exam Constitutional:      Appearance: She is ill-appearing.     Comments: Pt sounds nasally and congested. Sniffling and coughing throughout conversation.  Pulmonary:     Effort: Pulmonary effort is normal.      Comments: Nasally and congested sounding on telemed Musculoskeletal:     Cervical back: Normal range of motion and neck supple.  Skin:    Comments: Normal in appearance for ethnicity.   Neurological:     General: No focal deficit present.     Mental Status: She is alert and oriented to person, place, and time. Mental status is at baseline.  Psychiatric:        Mood and Affect: Mood normal.        Behavior: Behavior normal.        Thought Content: Thought content normal.        Judgment: Judgment normal.    Assessment and Plan: Acute non-recurrent sinusitis, unspecified location Assessment & Plan: Given time course and symptoms, along with co-morbidities - appears to have developed sinus infection.  Allergies reviewed. - Augmentin BID for 10 days.  - Mucinex cough expectorant - azelastine for nasal congestion - recommend nasal saline spray for congestion as well - may use chloraseptic spray for throat irritation - Please f/u if symptoms worsen of persist.   Orders: -     Amoxicillin-Pot Clavulanate; Take 1 tablet by mouth 2 (two) times daily.  Dispense: 20 tablet; Refill: 0 -     guaiFENesin; Take 1 tablet (200 mg total) by mouth every 4 (four) hours as needed for cough or to loosen phlegm.  Dispense: 30 suppository; Refill: 0 -     Azelastine HCl; Place 2 sprays into both nostrils 2 (two) times daily as needed for rhinitis. Use in each nostril as directed  Dispense: 30 mL; Refill: 12  Acute cough Assessment & Plan: Given time course of cough and starting to cause rib irritation due to consistent coughing will prescribe Tussionex for cough suppression  - continue hot tea with honey - Throat lozenges - Humidification - increase water/fluid intake - rest    Orders: -     Hydrocod Poli-Chlorphe Poli ER; Take 2.5 mLs by mouth every 12 (twelve) hours as needed for cough.  Dispense: 70 mL; Refill: 0  COPD with exacerbation (HCC) Assessment & Plan: Continue daily Advair and as  needed SABA - Five day course of prednisone 20mg  po BID - if symptoms worsen of persist - please follow up in at clinic in person for further eval and mgmt. - Emergent symptoms of respiratory distress and higher needs discussed.  Orders: -     predniSONE; Take 1 tablet (20 mg total) by mouth in the morning and at bedtime.  Dispense: 10 tablet; Refill: 0    Follow Up Instructions: Return if symptoms worsen or fail to improve.   I discussed the assessment and treatment plan with the patient. The patient was provided an opportunity to ask questions, and all were answered. The patient agreed with the plan and demonstrated an understanding of the instructions.   The patient was advised to call back or seek an in-person evaluation if the symptoms worsen or if the condition fails to improve as anticipated.  The  above assessment and management plan was discussed with the patient. The patient verbalized understanding of and has agreed to the management plan.   I, Sallee Provencal, FNP, have reviewed all documentation for this visit. The documentation on 06/28/23 for the exam, diagnosis, procedures, and orders are all accurate and complete.   Sallee Provencal, FNP

## 2023-06-28 NOTE — Assessment & Plan Note (Addendum)
Continue daily Advair and as needed SABA - Five day course of prednisone 20mg  po BID - if symptoms worsen of persist - please follow up in at clinic in person for further eval and mgmt. - Emergent symptoms of respiratory distress and higher needs discussed.

## 2023-06-28 NOTE — Assessment & Plan Note (Signed)
Given time course of cough and starting to cause rib irritation due to consistent coughing will prescribe Tussionex for cough suppression  - continue hot tea with honey - Throat lozenges - Humidification - increase water/fluid intake - rest

## 2023-08-23 ENCOUNTER — Other Ambulatory Visit: Payer: Self-pay | Admitting: Family Medicine

## 2023-08-23 DIAGNOSIS — R051 Acute cough: Secondary | ICD-10-CM

## 2023-08-23 NOTE — Telephone Encounter (Signed)
Recommend this she be seen in person for assessment. Nearly two months of a cough needs in person evaluation.

## 2023-11-15 ENCOUNTER — Telehealth: Payer: Self-pay | Admitting: Physician Assistant

## 2023-11-15 DIAGNOSIS — J449 Chronic obstructive pulmonary disease, unspecified: Secondary | ICD-10-CM

## 2023-11-15 MED ORDER — FLUTICASONE-SALMETEROL 100-50 MCG/ACT IN AEPB
1.0000 | INHALATION_SPRAY | Freq: Two times a day (BID) | RESPIRATORY_TRACT | 11 refills | Status: AC
Start: 2023-11-15 — End: ?

## 2023-11-15 MED ORDER — ALBUTEROL SULFATE HFA 108 (90 BASE) MCG/ACT IN AERS: INHALATION_SPRAY | RESPIRATORY_TRACT | 11 refills | Status: AC

## 2023-11-15 NOTE — Telephone Encounter (Signed)
 [facility] is requesting refill ADVAIR DISKUS 100/50MCG 60 AER Please advise

## 2023-11-15 NOTE — Telephone Encounter (Signed)
 [facility] pharmacy is requesting refill albuterol  (VENTOLIN  HFA) 108 (90 Base) MCG/ACT inhaler  Please advise

## 2024-02-19 ENCOUNTER — Encounter: Payer: Self-pay | Admitting: Family Medicine

## 2024-02-19 ENCOUNTER — Ambulatory Visit: Admitting: Family Medicine

## 2024-02-19 VITALS — BP 144/82 | HR 72 | Ht 63.0 in | Wt 180.6 lb

## 2024-02-19 DIAGNOSIS — R200 Anesthesia of skin: Secondary | ICD-10-CM | POA: Diagnosis not present

## 2024-02-19 DIAGNOSIS — M79641 Pain in right hand: Secondary | ICD-10-CM | POA: Diagnosis not present

## 2024-02-19 DIAGNOSIS — M19041 Primary osteoarthritis, right hand: Secondary | ICD-10-CM | POA: Diagnosis not present

## 2024-02-19 DIAGNOSIS — F17200 Nicotine dependence, unspecified, uncomplicated: Secondary | ICD-10-CM

## 2024-02-19 DIAGNOSIS — M7989 Other specified soft tissue disorders: Secondary | ICD-10-CM

## 2024-02-19 DIAGNOSIS — M1811 Unilateral primary osteoarthritis of first carpometacarpal joint, right hand: Secondary | ICD-10-CM | POA: Diagnosis not present

## 2024-02-19 DIAGNOSIS — Z122 Encounter for screening for malignant neoplasm of respiratory organs: Secondary | ICD-10-CM

## 2024-02-19 DIAGNOSIS — I10 Essential (primary) hypertension: Secondary | ICD-10-CM

## 2024-02-19 DIAGNOSIS — R221 Localized swelling, mass and lump, neck: Secondary | ICD-10-CM

## 2024-02-19 DIAGNOSIS — E782 Mixed hyperlipidemia: Secondary | ICD-10-CM

## 2024-02-19 DIAGNOSIS — Z1211 Encounter for screening for malignant neoplasm of colon: Secondary | ICD-10-CM

## 2024-02-19 MED ORDER — MELOXICAM 15 MG PO TABS: 15.0000 mg | ORAL_TABLET | Freq: Every day | ORAL | 0 refills | Status: AC

## 2024-02-19 NOTE — Progress Notes (Addendum)
 Acute Office Visit  Introduced to nurse practitioner role and practice setting.  All questions answered.  Discussed provider/patient relationship and expectations.   Subjective:     Patient ID: Allison Brennan, female    DOB: 17-Feb-1966, 58 y.o.   MRN: [id number]  Chief Complaint  Patient presents with   Hand Pain    R hand pain x 1 day Started using a hand sanitizer. Or unsure arthritis is acting up, unable to use hand.   Discussed the use of AI scribe software for clinical note transcription with the patient, who gave verbal consent to proceed.  History of Present Illness Allison Brennan is a 58 year old female presents with right hand pain.  PMH= HTN, arthritis, COPD, Hyperlipidemia  She has been experiencing right hand pain since Sunday, rated as 10 out of 10 in severity. The pain is described as achy and is primarily located in her fingers, making it difficult to close her hand. The pain began after using a new hand sanitizer, which she has since discontinued. She has not taken any medication for the pain.  She has a history of arthritis but does not take any regular medication for it. She recalls receiving cortisone injections in her thumbs a few years ago. 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  Her current medications include valsartan  for hypertension, Advair, and albuterol  as needed for respiratory issues. She mentions using BC powder for headaches but has not used it for her hand pain.  She smokes a pack to a pack and a half of cigarettes daily. No pain in the left hand, elbows, or shoulders. No history of hand injuries or bites.   Hand Pain     ROS     Objective:    BP (!) 144/82 (BP Location: Left Arm, Patient Position: Sitting, Cuff Size: Normal)   Pulse 72   Ht 5' 3 (1.6 m)   Wt 180 lb 9.6 oz (81.9 kg)   SpO2 95%   BMI  31.99 kg/m    Physical Exam Constitutional:      General: She is not in acute distress.    Appearance: Normal appearance. She is obese. She is not toxic-appearing or diaphoretic.  HENT:     Head: Normocephalic.     Nose: Nose normal.     Mouth/Throat:     Mouth: Mucous membranes are moist.     Pharynx: Oropharynx is clear.  Eyes:     Extraocular Movements: Extraocular movements intact.     Pupils: Pupils are equal, round, and reactive to light.  Cardiovascular:     Rate and Rhythm: Normal rate and regular rhythm.     Pulses: Normal pulses.     Heart sounds: Normal heart sounds. No murmur heard.    No friction rub. No gallop.  Pulmonary:     Effort: No respiratory distress.     Breath sounds: No stridor. No wheezing, rhonchi or rales.     Comments: diminished Chest:     Chest wall: No tenderness.  Musculoskeletal:     Right hand: Swelling and tenderness present. No bony tenderness. Decreased range of motion. Decreased strength of finger abduction, thumb/finger opposition and wrist extension. Normal sensation. Normal capillary refill. Normal pulse.     Left hand: Normal.     Right lower leg: No edema.  Left lower leg: No edema.  Skin:    General: Skin is warm and dry.     Capillary Refill: Capillary refill takes less than 2 seconds.  Neurological:     General: No focal deficit present.     Mental Status: She is alert and oriented to person, place, and time. Mental status is at baseline.  Psychiatric:        Mood and Affect: Mood normal.        Behavior: Behavior normal.        Thought Content: Thought content normal.        Judgment: Judgment normal.     No results found for any visits on 02/19/24.     Assessment & Plan:  Assessment and Plan Assessment & Plan Right hand pain and swelling due to arthritis flare Acute right hand pain and swelling since Sunday, likely due to osteoarthritis flare. Pain is severe, rated 10/10, and achy. No history of injury or bites.  Extensive hand use at work may exacerbate symptoms. Order x-ray of the right hand to rule out other causes such as infection. R hand swelling, stiffness, pain with movement, none with touch - Prescribe meloxicam  15mg  daily prn for pain management. - Order x-ray of the right hand to rule out other causes such as infection. - Advise against using BC powder while on meloxicam . - Recommend using extra strength Tylenol  for additional pain relief. - Work note given to be out for work, given laborious job - [employer], [employer], rest - Refer to [facility] for potential hand injections if pain persists.  Hypertension Chronic, elevated Goal<130/80 Monitor at home with upper arm cuff - Limit Sodium intake -Continue Valsartan  320mg  daily - CMP, Lipid order - Stressed tobacco cessation  COPD - Continue Advair daily - Continue albuterol  as needed.  Tobacco use disorder Current smoker, consuming a pack to a pack and a half of cigarettes daily. Smoking contributes to chronic inflammation and may exacerbate hand pain. - Advise reducing smoking to one pack per day.  Hyperlipidemia  - Not on any statin - Needs fasting Lipid Panel, would recommend statin based on results - Tobacco cessation stressed  Current Risk based on Previous labs: The 10-year ASCVD risk score (Arnett DK, et al., 2019) is: 15.1%   Values used to calculate the score:     Age: 85 years     Clincally relevant sex: Female     Is Non-Hispanic African American: No     Diabetic: No     Tobacco smoker: Yes     Systolic Blood Pressure: 144 mmHg     Is BP treated: Yes     HDL Cholesterol: 36 mg/dL     Total Cholesterol: 214 mg/dL  Left sided neck mass/ elevation - pt has concerns for Left side of neck being raise - no ROM issues, not painful - no lymph nodes felt - doesn't appear thyroid  related - will order neck soft tissue to r/u any concerns  Due for Lung CA Screening - Referral placed to pulm  Due for Mammogram Scheduled  on 03/31/24  Due for Colonoscopy Referral placed   Problem List Items Addressed This Visit       Cardiovascular and Mediastinum   Essential hypertension   Relevant Orders   CBC     Other   Tobacco dependence   Relevant Orders   Comprehensive metabolic panel with GFR   Ambulatory Referral for Lung Cancer Screening [[id number]]   Other Visit Diagnoses  Right hand pain    -  Primary   Relevant Medications   meloxicam  (MOBIC ) 15 MG tablet   Other Relevant Orders   DG Hand Complete Right     Swelling of right hand       Relevant Medications   meloxicam  (MOBIC ) 15 MG tablet   Other Relevant Orders   DG Hand Complete Right     Mixed hyperlipidemia       Relevant Orders   Lipid panel     Mass of left side of neck       Relevant Orders   US  Soft Tissue Head/Neck (NON-THYROID )     Colon cancer screening       Relevant Orders   Ambulatory referral to Gastroenterology     Screening for lung cancer           Meds ordered this encounter  Medications   meloxicam  (MOBIC ) 15 MG tablet    Sig: Take 1 tablet (15 mg total) by mouth daily.    Dispense:  30 tablet    Refill:  0    Return in about 6 months (around 08/21/2024) for HTN.  Curtis DELENA Boom, FNP  I, Curtis DELENA Boom, FNP, have reviewed all documentation for this visit. The documentation on 02/19/24 for the exam, diagnosis, procedures, and orders are all accurate and complete.

## 2024-02-21 ENCOUNTER — Ambulatory Visit
Admission: RE | Admit: 2024-02-21 | Discharge: 2024-02-21 | Disposition: A | Discharge status: Home / Self Care | Source: Ambulatory Visit | Attending: Family Medicine | Admitting: Family Medicine

## 2024-02-21 DIAGNOSIS — I10 Essential (primary) hypertension: Secondary | ICD-10-CM | POA: Diagnosis not present

## 2024-02-21 DIAGNOSIS — E782 Mixed hyperlipidemia: Secondary | ICD-10-CM | POA: Diagnosis not present

## 2024-02-21 DIAGNOSIS — R221 Localized swelling, mass and lump, neck: Secondary | ICD-10-CM | POA: Insufficient documentation

## 2024-02-21 DIAGNOSIS — Z0389 Encounter for observation for other suspected diseases and conditions ruled out: Secondary | ICD-10-CM | POA: Diagnosis not present

## 2024-02-21 DIAGNOSIS — F17200 Nicotine dependence, unspecified, uncomplicated: Secondary | ICD-10-CM | POA: Diagnosis not present

## 2024-02-22 ENCOUNTER — Ambulatory Visit: Payer: Self-pay | Admitting: Family Medicine

## 2024-02-22 DIAGNOSIS — E782 Mixed hyperlipidemia: Secondary | ICD-10-CM

## 2024-02-22 LAB — COMPREHENSIVE METABOLIC PANEL WITH GFR
ALT: 16 IU/L (ref 0–32)
AST: 21 IU/L (ref 0–40)
Albumin: 4 g/dL (ref 3.8–4.9)
Alkaline Phosphatase: 106 IU/L (ref 44–121)
BUN/Creatinine Ratio: 12 (ref 9–23)
BUN: 9 mg/dL (ref 6–24)
Bilirubin Total: 0.3 mg/dL (ref 0.0–1.2)
CO2: 23 mmol/L (ref 20–29)
Calcium: 9.6 mg/dL (ref 8.7–10.2)
Chloride: 100 mmol/L (ref 96–106)
Creatinine, Ser: 0.75 mg/dL (ref 0.57–1.00)
Globulin, Total: 2.9 g/dL (ref 1.5–4.5)
Glucose: 102 mg/dL — ABNORMAL HIGH (ref 70–99)
Potassium: 4.6 mmol/L (ref 3.5–5.2)
Sodium: 140 mmol/L (ref 134–144)
Total Protein: 6.9 g/dL (ref 6.0–8.5)
eGFR: 92 mL/min/1.73 (ref >59)

## 2024-02-22 LAB — LIPID PANEL
Chol/HDL Ratio: 5.4 ratio — ABNORMAL HIGH (ref 0.0–4.4)
Cholesterol, Total: 211 mg/dL — ABNORMAL HIGH (ref 100–199)
HDL: 39 mg/dL — ABNORMAL LOW (ref >39)
LDL Chol Calc (NIH): 121 mg/dL — ABNORMAL HIGH (ref 0–99)
Triglycerides: 291 mg/dL — ABNORMAL HIGH (ref 0–149)
VLDL Cholesterol Cal: 51 mg/dL — ABNORMAL HIGH (ref 5–40)

## 2024-02-22 LAB — CBC
Hematocrit: 49.7 % — ABNORMAL HIGH (ref 34.0–46.6)
Hemoglobin: 16 g/dL — ABNORMAL HIGH (ref 11.1–15.9)
MCH: 30.7 pg (ref 26.6–33.0)
MCHC: 32.2 g/dL (ref 31.5–35.7)
MCV: 95 fL (ref 79–97)
Platelets: 272 x10E3/uL (ref 150–450)
RBC: 5.21 x10E6/uL (ref 3.77–5.28)
RDW: 12.9 % (ref 11.7–15.4)
WBC: 9.5 x10E3/uL (ref 3.4–10.8)

## 2024-02-22 MED ORDER — ROSUVASTATIN CALCIUM 20 MG PO TABS: 20.0000 mg | ORAL_TABLET | Freq: Every day | ORAL | 3 refills | Status: AC

## 2024-03-27 DIAGNOSIS — G5601 Carpal tunnel syndrome, right upper limb: Secondary | ICD-10-CM | POA: Diagnosis not present

## 2024-04-08 DIAGNOSIS — M65331 Trigger finger, right middle finger: Secondary | ICD-10-CM | POA: Diagnosis not present

## 2024-04-24 ENCOUNTER — Other Ambulatory Visit: Payer: Self-pay | Admitting: Family Medicine

## 2024-04-24 DIAGNOSIS — I10 Essential (primary) hypertension: Secondary | ICD-10-CM

## 2024-04-24 NOTE — Telephone Encounter (Unsigned)
 Copied from CRM #[id number]. Topic: Clinical - Medication Refill >> Apr 24, 2024  2:11 PM Nathanel BROCKS wrote: Medication: valsartan  (DIOVAN ) 320 MG tablet   Has the patient contacted their pharmacy? No   This is the patient's preferred pharmacy:  [facility] [address]), [place] - [address] [place]) [place] [address] Phone: [phone] Fax: [phone]    Is this the correct pharmacy for this prescription? Yes If no, delete pharmacy and type the correct one.   Has the prescription been filled recently? Yes  Is the patient out of the medication? Yes  Has the patient been seen for an appointment in the last year OR does the patient have an upcoming appointment? Yes  Can we respond through MyChart? Yes  Agent: Please be advised that Rx refills may take up to 3 business days. We ask that you follow-up with your pharmacy.

## 2024-04-25 MED ORDER — VALSARTAN 320 MG PO TABS: 320.0000 mg | ORAL_TABLET | Freq: Every day | ORAL | 1 refills | Status: AC

## 2024-04-25 NOTE — Telephone Encounter (Signed)
 Requested Prescriptions  Pending Prescriptions Disp Refills   valsartan  (DIOVAN ) 320 MG tablet 90 tablet 1    Sig: Take 1 tablet (320 mg total) by mouth daily.     Cardiovascular:  Angiotensin Receptor Blockers Failed - 04/25/2024  5:11 PM      Failed - Last BP in normal range    BP Readings from Last 1 Encounters:  02/19/24 (!) 144/82         Failed - Valid encounter within last 6 months    Recent Outpatient Visits           2 months ago Right hand pain    [facility] [place], [place], [place]              Passed - Cr in normal range and within 180 days    Creat  Date Value Ref Range Status  10/11/2021 0.83 0.50 - 1.03 mg/dL Final   Creatinine, Ser  Date Value Ref Range Status  02/21/2024 0.75 0.57 - 1.00 mg/dL Final   Creatinine, Urine  Date Value Ref Range Status  05/29/2012 40.4 mg/dL Final         Passed - K in normal range and within 180 days    Potassium  Date Value Ref Range Status  02/21/2024 4.6 3.5 - 5.2 mmol/L Final         Passed - Patient is not pregnant

## 2024-06-16 ENCOUNTER — Encounter

## 2024-06-16 NOTE — Progress Notes (Deleted)
°  ° ° °  Established patient visit   Patient: Allison Brennan   DOB: [birth year]   58 y.o. Female  MRN: [id number] Visit Date: 06/16/2024  Today's healthcare provider: Isaiah DELENA Pepper, MD   No chief complaint on file.  Subjective    HPI  Discussed the use of AI scribe software for clinical note transcription with the patient, who gave verbal consent to proceed.  History of Present Illness      Medications: Show/hide medication list[1]  Review of Systems as noted in HPI.  {Insert previous labs (optional):23779} {See past labs  Heme  Chem  Endocrine  Serology  Results Review (optional):1}   Objective    There were no vitals taken for this visit. {Insert last BP/Wt (optional):23777}{See vitals history (optional):1}  Physical Exam   No results found for any visits on 06/16/24.  Assessment & Plan     Problem List Items Addressed This Visit   None   Assessment and Plan Assessment & Plan       No follow-ups on file.       Isaiah DELENA Pepper, MD  [facility] [phone] (phone) [phone] (fax)    [1]  Outpatient Medications Prior to Visit  Medication Sig   albuterol  (VENTOLIN  HFA) 108 (90 Base) MCG/ACT inhaler INHALE 2 PUFFS INTO LUNGS EVERY 4 HOURS AS NEEDED FOR WHEEZING OR  SHORTNESS  OF  BREATH   fluticasone -salmeterol (ADVAIR DISKUS) 100-50 MCG/ACT AEPB Inhale 1 puff into the lungs 2 (two) times daily.   meloxicam  (MOBIC ) 15 MG tablet Take 1 tablet (15 mg total) by mouth daily.   rosuvastatin  (CRESTOR ) 20 MG tablet Take 1 tablet (20 mg total) by mouth daily.   valsartan  (DIOVAN ) 320 MG tablet Take 1 tablet (320 mg total) by mouth daily.   No facility-administered medications prior to visit.

## 2024-08-22 ENCOUNTER — Ambulatory Visit: Admitting: Family Medicine
